# Patient Record
Sex: Female | Born: 1975 | Race: Black or African American | Hispanic: No | Marital: Married | Smoking: Current every day smoker
Health system: Southern US, Community
[De-identification: ages and names within clinical notes are randomized; demographics above are authoritative.]

## PROBLEM LIST (undated history)

## (undated) DIAGNOSIS — N12 Tubulo-interstitial nephritis, not specified as acute or chronic: Secondary | ICD-10-CM

## (undated) DIAGNOSIS — F329 Major depressive disorder, single episode, unspecified: Secondary | ICD-10-CM

## (undated) DIAGNOSIS — R112 Nausea with vomiting, unspecified: Secondary | ICD-10-CM

## (undated) DIAGNOSIS — F32A Depression, unspecified: Secondary | ICD-10-CM

## (undated) DIAGNOSIS — R7303 Prediabetes: Secondary | ICD-10-CM

## (undated) DIAGNOSIS — Z9889 Other specified postprocedural states: Secondary | ICD-10-CM

## (undated) DIAGNOSIS — R011 Cardiac murmur, unspecified: Secondary | ICD-10-CM

## (undated) DIAGNOSIS — G8929 Other chronic pain: Secondary | ICD-10-CM

## (undated) DIAGNOSIS — D649 Anemia, unspecified: Secondary | ICD-10-CM

## (undated) DIAGNOSIS — G43909 Migraine, unspecified, not intractable, without status migrainosus: Secondary | ICD-10-CM

## (undated) DIAGNOSIS — M62838 Other muscle spasm: Secondary | ICD-10-CM

## (undated) DIAGNOSIS — Z9289 Personal history of other medical treatment: Secondary | ICD-10-CM

## (undated) DIAGNOSIS — F419 Anxiety disorder, unspecified: Secondary | ICD-10-CM

## (undated) DIAGNOSIS — M5030 Other cervical disc degeneration, unspecified cervical region: Secondary | ICD-10-CM

## (undated) DIAGNOSIS — D561 Beta thalassemia: Secondary | ICD-10-CM

## (undated) HISTORY — PX: ABLATION: SHX5711

## (undated) HISTORY — PX: PARTIAL HYSTERECTOMY: SHX80

## (undated) HISTORY — DX: Beta thalassemia: D56.1

## (undated) HISTORY — PX: TUMOR EXCISION: SHX421

---
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
## 1997-09-26 ENCOUNTER — Other Ambulatory Visit: Admission: RE | Admit: 1997-09-26 | Discharge: 1997-09-26 | Payer: Self-pay | Admitting: Family Medicine

## 1997-09-27 ENCOUNTER — Emergency Department (HOSPITAL_COMMUNITY): Admission: EM | Admit: 1997-09-27 | Discharge: 1997-09-27 | Payer: Self-pay | Admitting: Emergency Medicine

## 1998-01-10 ENCOUNTER — Emergency Department (HOSPITAL_COMMUNITY): Admission: EM | Admit: 1998-01-10 | Discharge: 1998-01-10 | Payer: Self-pay | Admitting: Emergency Medicine

## 1998-02-15 ENCOUNTER — Emergency Department (HOSPITAL_COMMUNITY): Admission: EM | Admit: 1998-02-15 | Discharge: 1998-02-15 | Payer: Self-pay | Admitting: Emergency Medicine

## 1999-06-07 ENCOUNTER — Ambulatory Visit (HOSPITAL_COMMUNITY): Admission: RE | Admit: 1999-06-07 | Discharge: 1999-06-07 | Payer: Self-pay | Admitting: *Deleted

## 1999-07-08 ENCOUNTER — Inpatient Hospital Stay (HOSPITAL_COMMUNITY): Admission: AD | Admit: 1999-07-08 | Discharge: 1999-07-08 | Payer: Self-pay | Admitting: *Deleted

## 1999-07-15 ENCOUNTER — Inpatient Hospital Stay (HOSPITAL_COMMUNITY): Admission: AD | Admit: 1999-07-15 | Discharge: 1999-07-15 | Payer: Self-pay | Admitting: Obstetrics & Gynecology

## 1999-09-09 ENCOUNTER — Observation Stay (HOSPITAL_COMMUNITY): Admission: AD | Admit: 1999-09-09 | Discharge: 1999-09-10 | Payer: Self-pay | Admitting: Obstetrics & Gynecology

## 1999-09-16 ENCOUNTER — Inpatient Hospital Stay (HOSPITAL_COMMUNITY): Admission: AD | Admit: 1999-09-16 | Discharge: 1999-09-16 | Payer: Self-pay | Admitting: Obstetrics & Gynecology

## 1999-09-23 ENCOUNTER — Inpatient Hospital Stay (HOSPITAL_COMMUNITY): Admission: AD | Admit: 1999-09-23 | Discharge: 1999-09-23 | Payer: Self-pay | Admitting: Obstetrics & Gynecology

## 1999-09-27 ENCOUNTER — Inpatient Hospital Stay (HOSPITAL_COMMUNITY): Admission: AD | Admit: 1999-09-27 | Discharge: 1999-09-27 | Payer: Self-pay | Admitting: Obstetrics

## 1999-09-29 ENCOUNTER — Inpatient Hospital Stay (HOSPITAL_COMMUNITY): Admission: AD | Admit: 1999-09-29 | Discharge: 1999-09-29 | Payer: Self-pay | Admitting: Obstetrics

## 1999-10-02 ENCOUNTER — Inpatient Hospital Stay (HOSPITAL_COMMUNITY): Admission: AD | Admit: 1999-10-02 | Discharge: 1999-10-02 | Payer: Self-pay | Admitting: *Deleted

## 1999-10-03 ENCOUNTER — Inpatient Hospital Stay (HOSPITAL_COMMUNITY): Admission: AD | Admit: 1999-10-03 | Discharge: 1999-10-05 | Payer: Self-pay | Admitting: Obstetrics

## 1999-12-12 ENCOUNTER — Emergency Department (HOSPITAL_COMMUNITY): Admission: EM | Admit: 1999-12-12 | Discharge: 1999-12-12 | Payer: Self-pay | Admitting: Emergency Medicine

## 2001-05-09 HISTORY — PX: DILATION AND CURETTAGE OF UTERUS: SHX78

## 2001-06-20 ENCOUNTER — Emergency Department (HOSPITAL_COMMUNITY): Admission: EM | Admit: 2001-06-20 | Discharge: 2001-06-21 | Payer: Self-pay

## 2001-08-21 ENCOUNTER — Emergency Department (HOSPITAL_COMMUNITY): Admission: EM | Admit: 2001-08-21 | Discharge: 2001-08-21 | Payer: Self-pay | Admitting: Emergency Medicine

## 2001-10-04 ENCOUNTER — Emergency Department (HOSPITAL_COMMUNITY): Admission: EM | Admit: 2001-10-04 | Discharge: 2001-10-04 | Payer: Self-pay | Admitting: Emergency Medicine

## 2001-10-15 ENCOUNTER — Encounter: Payer: Self-pay | Admitting: Emergency Medicine

## 2001-10-15 ENCOUNTER — Emergency Department (HOSPITAL_COMMUNITY): Admission: EM | Admit: 2001-10-15 | Discharge: 2001-10-15 | Payer: Self-pay | Admitting: Emergency Medicine

## 2001-10-17 ENCOUNTER — Inpatient Hospital Stay (HOSPITAL_COMMUNITY): Admission: AD | Admit: 2001-10-17 | Discharge: 2001-10-17 | Payer: Self-pay | Admitting: *Deleted

## 2001-10-21 ENCOUNTER — Inpatient Hospital Stay (HOSPITAL_COMMUNITY): Admission: AD | Admit: 2001-10-21 | Discharge: 2001-10-21 | Payer: Self-pay | Admitting: *Deleted

## 2001-10-24 ENCOUNTER — Inpatient Hospital Stay (HOSPITAL_COMMUNITY): Admission: RE | Admit: 2001-10-24 | Discharge: 2001-10-24 | Payer: Self-pay | Admitting: *Deleted

## 2001-10-24 ENCOUNTER — Encounter: Payer: Self-pay | Admitting: *Deleted

## 2001-11-08 ENCOUNTER — Encounter (INDEPENDENT_AMBULATORY_CARE_PROVIDER_SITE_OTHER): Payer: Self-pay

## 2001-11-08 ENCOUNTER — Ambulatory Visit (HOSPITAL_COMMUNITY): Admission: RE | Admit: 2001-11-08 | Discharge: 2001-11-08 | Payer: Self-pay | Admitting: Obstetrics and Gynecology

## 2001-11-22 ENCOUNTER — Other Ambulatory Visit: Admission: RE | Admit: 2001-11-22 | Discharge: 2001-11-22 | Payer: Self-pay | Admitting: Obstetrics and Gynecology

## 2002-12-06 ENCOUNTER — Emergency Department (HOSPITAL_COMMUNITY): Admission: EM | Admit: 2002-12-06 | Discharge: 2002-12-07 | Payer: Self-pay

## 2003-02-04 ENCOUNTER — Emergency Department (HOSPITAL_COMMUNITY): Admission: EM | Admit: 2003-02-04 | Discharge: 2003-02-05 | Payer: Self-pay | Admitting: Urology

## 2003-05-07 ENCOUNTER — Emergency Department (HOSPITAL_COMMUNITY): Admission: AD | Admit: 2003-05-07 | Discharge: 2003-05-07 | Payer: Self-pay | Admitting: Family Medicine

## 2003-07-09 ENCOUNTER — Ambulatory Visit (HOSPITAL_BASED_OUTPATIENT_CLINIC_OR_DEPARTMENT_OTHER): Admission: RE | Admit: 2003-07-09 | Discharge: 2003-07-09 | Payer: Self-pay | Admitting: General Surgery

## 2003-07-09 ENCOUNTER — Encounter (INDEPENDENT_AMBULATORY_CARE_PROVIDER_SITE_OTHER): Payer: Self-pay | Admitting: Specialist

## 2003-07-09 ENCOUNTER — Ambulatory Visit (HOSPITAL_COMMUNITY): Admission: RE | Admit: 2003-07-09 | Discharge: 2003-07-09 | Payer: Self-pay | Admitting: General Surgery

## 2003-08-24 ENCOUNTER — Emergency Department (HOSPITAL_COMMUNITY): Admission: EM | Admit: 2003-08-24 | Discharge: 2003-08-24 | Payer: Self-pay | Admitting: Emergency Medicine

## 2003-12-19 ENCOUNTER — Inpatient Hospital Stay (HOSPITAL_COMMUNITY): Admission: AD | Admit: 2003-12-19 | Discharge: 2003-12-19 | Payer: Self-pay | Admitting: Obstetrics and Gynecology

## 2003-12-19 ENCOUNTER — Inpatient Hospital Stay (HOSPITAL_COMMUNITY): Admission: AD | Admit: 2003-12-19 | Discharge: 2003-12-19 | Payer: Self-pay | Admitting: Family Medicine

## 2004-01-08 ENCOUNTER — Ambulatory Visit: Payer: Self-pay | Admitting: Obstetrics and Gynecology

## 2004-02-08 ENCOUNTER — Emergency Department (HOSPITAL_COMMUNITY): Admission: EM | Admit: 2004-02-08 | Discharge: 2004-02-09 | Payer: Self-pay | Admitting: Emergency Medicine

## 2004-07-11 ENCOUNTER — Emergency Department (HOSPITAL_COMMUNITY): Admission: EM | Admit: 2004-07-11 | Discharge: 2004-07-11 | Payer: Self-pay | Admitting: Emergency Medicine

## 2004-11-05 ENCOUNTER — Emergency Department (HOSPITAL_COMMUNITY): Admission: EM | Admit: 2004-11-05 | Discharge: 2004-11-05 | Payer: Self-pay | Admitting: Emergency Medicine

## 2004-11-16 ENCOUNTER — Emergency Department (HOSPITAL_COMMUNITY): Admission: EM | Admit: 2004-11-16 | Discharge: 2004-11-16 | Payer: Self-pay | Admitting: Emergency Medicine

## 2004-11-25 ENCOUNTER — Emergency Department (HOSPITAL_COMMUNITY): Admission: EM | Admit: 2004-11-25 | Discharge: 2004-11-25 | Payer: Self-pay | Admitting: Emergency Medicine

## 2005-02-14 ENCOUNTER — Emergency Department (HOSPITAL_COMMUNITY): Admission: EM | Admit: 2005-02-14 | Discharge: 2005-02-14 | Payer: Self-pay | Admitting: Emergency Medicine

## 2005-03-10 ENCOUNTER — Emergency Department (HOSPITAL_COMMUNITY): Admission: EM | Admit: 2005-03-10 | Discharge: 2005-03-11 | Payer: Self-pay | Admitting: *Deleted

## 2005-06-20 ENCOUNTER — Emergency Department (HOSPITAL_COMMUNITY): Admission: EM | Admit: 2005-06-20 | Discharge: 2005-06-20 | Payer: Self-pay | Admitting: Emergency Medicine

## 2006-09-08 ENCOUNTER — Inpatient Hospital Stay (HOSPITAL_COMMUNITY): Admission: AD | Admit: 2006-09-08 | Discharge: 2006-09-08 | Payer: Self-pay | Admitting: Obstetrics & Gynecology

## 2006-09-10 ENCOUNTER — Inpatient Hospital Stay (HOSPITAL_COMMUNITY): Admission: AD | Admit: 2006-09-10 | Discharge: 2006-09-10 | Payer: Self-pay | Admitting: Obstetrics & Gynecology

## 2006-09-14 ENCOUNTER — Inpatient Hospital Stay (HOSPITAL_COMMUNITY): Admission: AD | Admit: 2006-09-14 | Discharge: 2006-09-15 | Payer: Self-pay | Admitting: Obstetrics and Gynecology

## 2006-09-29 ENCOUNTER — Inpatient Hospital Stay (HOSPITAL_COMMUNITY): Admission: AD | Admit: 2006-09-29 | Discharge: 2006-09-29 | Payer: Self-pay | Admitting: Obstetrics and Gynecology

## 2007-03-07 ENCOUNTER — Inpatient Hospital Stay (HOSPITAL_COMMUNITY): Admission: AD | Admit: 2007-03-07 | Discharge: 2007-03-07 | Payer: Self-pay | Admitting: Obstetrics and Gynecology

## 2007-04-04 ENCOUNTER — Inpatient Hospital Stay (HOSPITAL_COMMUNITY): Admission: AD | Admit: 2007-04-04 | Discharge: 2007-04-05 | Payer: Self-pay | Admitting: Obstetrics and Gynecology

## 2007-04-17 ENCOUNTER — Inpatient Hospital Stay (HOSPITAL_COMMUNITY): Admission: AD | Admit: 2007-04-17 | Discharge: 2007-04-18 | Payer: Self-pay | Admitting: Obstetrics and Gynecology

## 2007-04-23 ENCOUNTER — Inpatient Hospital Stay (HOSPITAL_COMMUNITY): Admission: AD | Admit: 2007-04-23 | Discharge: 2007-04-23 | Payer: Self-pay | Admitting: Obstetrics and Gynecology

## 2007-04-30 ENCOUNTER — Inpatient Hospital Stay (HOSPITAL_COMMUNITY): Admission: AD | Admit: 2007-04-30 | Discharge: 2007-05-03 | Payer: Self-pay | Admitting: Obstetrics and Gynecology

## 2007-11-28 ENCOUNTER — Emergency Department (HOSPITAL_COMMUNITY): Admission: EM | Admit: 2007-11-28 | Discharge: 2007-11-28 | Payer: Self-pay | Admitting: Emergency Medicine

## 2008-01-15 ENCOUNTER — Emergency Department (HOSPITAL_COMMUNITY): Admission: EM | Admit: 2008-01-15 | Discharge: 2008-01-15 | Payer: Self-pay | Admitting: Emergency Medicine

## 2008-01-17 ENCOUNTER — Emergency Department (HOSPITAL_COMMUNITY): Admission: EM | Admit: 2008-01-17 | Discharge: 2008-01-18 | Payer: Self-pay | Admitting: Emergency Medicine

## 2008-02-27 ENCOUNTER — Emergency Department (HOSPITAL_COMMUNITY): Admission: EM | Admit: 2008-02-27 | Discharge: 2008-02-27 | Payer: Self-pay | Admitting: Emergency Medicine

## 2008-08-30 ENCOUNTER — Emergency Department (HOSPITAL_COMMUNITY): Admission: EM | Admit: 2008-08-30 | Discharge: 2008-08-30 | Payer: Self-pay | Admitting: Family Medicine

## 2009-05-09 HISTORY — PX: TUBAL LIGATION: SHX77

## 2009-06-05 ENCOUNTER — Inpatient Hospital Stay (HOSPITAL_COMMUNITY): Admission: AD | Admit: 2009-06-05 | Discharge: 2009-06-05 | Payer: Self-pay | Admitting: Obstetrics & Gynecology

## 2009-06-05 ENCOUNTER — Ambulatory Visit: Payer: Self-pay | Admitting: Advanced Practice Midwife

## 2009-09-18 ENCOUNTER — Inpatient Hospital Stay (HOSPITAL_COMMUNITY): Admission: AD | Admit: 2009-09-18 | Discharge: 2009-09-19 | Payer: Self-pay | Admitting: Obstetrics and Gynecology

## 2009-10-03 ENCOUNTER — Inpatient Hospital Stay (HOSPITAL_COMMUNITY): Admission: AD | Admit: 2009-10-03 | Discharge: 2009-10-06 | Payer: Self-pay | Admitting: Obstetrics and Gynecology

## 2009-10-04 ENCOUNTER — Encounter (INDEPENDENT_AMBULATORY_CARE_PROVIDER_SITE_OTHER): Payer: Self-pay | Admitting: Obstetrics and Gynecology

## 2009-10-09 ENCOUNTER — Inpatient Hospital Stay (HOSPITAL_COMMUNITY): Admission: AD | Admit: 2009-10-09 | Discharge: 2009-10-09 | Payer: Self-pay | Admitting: Obstetrics and Gynecology

## 2010-04-07 ENCOUNTER — Emergency Department (HOSPITAL_COMMUNITY)
Admission: EM | Admit: 2010-04-07 | Discharge: 2010-04-07 | Payer: Self-pay | Source: Home / Self Care | Admitting: Emergency Medicine

## 2010-04-09 ENCOUNTER — Emergency Department (HOSPITAL_COMMUNITY)
Admission: EM | Admit: 2010-04-09 | Discharge: 2010-04-09 | Payer: Self-pay | Source: Home / Self Care | Admitting: Emergency Medicine

## 2010-04-11 ENCOUNTER — Inpatient Hospital Stay (HOSPITAL_COMMUNITY)
Admission: EM | Admit: 2010-04-11 | Discharge: 2010-04-12 | Payer: Self-pay | Source: Home / Self Care | Admitting: Emergency Medicine

## 2010-07-20 LAB — CBC
Hemoglobin: 9.7 g/dL — ABNORMAL LOW (ref 12.0–15.0)
MCH: 21.9 pg — ABNORMAL LOW (ref 26.0–34.0)
MCHC: 31.2 g/dL (ref 30.0–36.0)
Platelets: 205 10*3/uL (ref 150–400)
RBC: 4.43 MIL/uL (ref 3.87–5.11)

## 2010-07-20 LAB — DIFFERENTIAL
Basophils Relative: 0 % (ref 0–1)
Eosinophils Absolute: 0.1 10*3/uL (ref 0.0–0.7)
Lymphocytes Relative: 17 % (ref 12–46)
Neutro Abs: 9.7 10*3/uL — ABNORMAL HIGH (ref 1.7–7.7)
Neutrophils Relative %: 74 % (ref 43–77)

## 2010-07-20 LAB — POCT I-STAT, CHEM 8
Chloride: 105 mEq/L (ref 96–112)
Creatinine, Ser: 1.1 mg/dL (ref 0.4–1.2)
HCT: 34 % — ABNORMAL LOW (ref 36.0–46.0)
Hemoglobin: 11.6 g/dL — ABNORMAL LOW (ref 12.0–15.0)
TCO2: 25 mmol/L (ref 0–100)

## 2010-07-26 LAB — CBC
HCT: 28.4 % — ABNORMAL LOW (ref 36.0–46.0)
HCT: 25.8 % — ABNORMAL LOW (ref 36.0–46.0)
Hemoglobin: 8.3 g/dL — ABNORMAL LOW (ref 12.0–15.0)
MCHC: 31.5 g/dL (ref 30.0–36.0)
MCHC: 32 g/dL (ref 30.0–36.0)
MCHC: 32.2 g/dL (ref 30.0–36.0)
MCV: 73.8 fL — ABNORMAL LOW (ref 78.0–100.0)
MCV: 73.1 fL — ABNORMAL LOW (ref 78.0–100.0)
MCV: 73.8 fL — ABNORMAL LOW (ref 78.0–100.0)
Platelets: 221 10*3/uL (ref 150–400)
Platelets: 236 10*3/uL (ref 150–400)
RBC: 3.5 MIL/uL — ABNORMAL LOW (ref 3.87–5.11)
RDW: 22.6 % — ABNORMAL HIGH (ref 11.5–15.5)
RDW: 23.1 % — ABNORMAL HIGH (ref 11.5–15.5)
WBC: 9.6 10*3/uL (ref 4.0–10.5)
WBC: 11 10*3/uL — ABNORMAL HIGH (ref 4.0–10.5)
WBC: 12.1 10*3/uL — ABNORMAL HIGH (ref 4.0–10.5)

## 2010-07-26 LAB — URINALYSIS, ROUTINE W REFLEX MICROSCOPIC
Bilirubin Urine: NEGATIVE
Glucose, UA: NEGATIVE mg/dL
Glucose, UA: NEGATIVE mg/dL
Hgb urine dipstick: NEGATIVE
Ketones, ur: NEGATIVE mg/dL
Protein, ur: NEGATIVE mg/dL
Specific Gravity, Urine: 1.015 (ref 1.005–1.030)
Urobilinogen, UA: 0.2 mg/dL (ref 0.0–1.0)
Urobilinogen, UA: 2 mg/dL — ABNORMAL HIGH (ref 0.0–1.0)
pH: 6 (ref 5.0–8.0)
pH: 6.5 (ref 5.0–8.0)

## 2010-07-26 LAB — COMPREHENSIVE METABOLIC PANEL
Albumin: 2.8 g/dL — ABNORMAL LOW (ref 3.5–5.2)
BUN: 12 mg/dL (ref 6–23)
Chloride: 107 mEq/L (ref 96–112)
Creatinine, Ser: 0.83 mg/dL (ref 0.4–1.2)
GFR calc Af Amer: >60 mL/min (ref >60)
GFR calc non Af Amer: >60 mL/min (ref >60)
Sodium: 137 mEq/L (ref 135–145)
Total Bilirubin: 0.2 mg/dL — ABNORMAL LOW (ref 0.3–1.2)

## 2010-07-26 LAB — RPR: RPR Ser Ql: NONREACTIVE

## 2010-07-26 LAB — URINE MICROSCOPIC-ADD ON

## 2010-07-26 LAB — RAPID HIV SCREEN (WH-MAU): Rapid HIV Screen: NONREACTIVE

## 2010-07-26 LAB — URIC ACID: Uric Acid, Serum: 5.9 mg/dL (ref 2.4–7.0)

## 2010-07-27 ENCOUNTER — Emergency Department (HOSPITAL_COMMUNITY)
Admission: EM | Admit: 2010-07-27 | Discharge: 2010-07-27 | Disposition: A | Discharge status: Home / Self Care | Payer: Medicaid Other | Attending: Emergency Medicine | Admitting: Emergency Medicine

## 2010-07-27 ENCOUNTER — Emergency Department (HOSPITAL_COMMUNITY): Payer: Medicaid Other

## 2010-07-27 DIAGNOSIS — F329 Major depressive disorder, single episode, unspecified: Secondary | ICD-10-CM | POA: Insufficient documentation

## 2010-07-27 DIAGNOSIS — F3289 Other specified depressive episodes: Secondary | ICD-10-CM | POA: Insufficient documentation

## 2010-07-27 DIAGNOSIS — R05 Cough: Secondary | ICD-10-CM | POA: Insufficient documentation

## 2010-07-27 DIAGNOSIS — J40 Bronchitis, not specified as acute or chronic: Secondary | ICD-10-CM | POA: Insufficient documentation

## 2010-07-27 DIAGNOSIS — R059 Cough, unspecified: Secondary | ICD-10-CM | POA: Insufficient documentation

## 2010-07-27 LAB — URINALYSIS, ROUTINE W REFLEX MICROSCOPIC
Bilirubin Urine: NEGATIVE
Ketones, ur: NEGATIVE mg/dL
Ketones, ur: NEGATIVE mg/dL
Nitrite: NEGATIVE
Nitrite: NEGATIVE
Protein, ur: NEGATIVE mg/dL
Specific Gravity, Urine: 1.02 (ref 1.005–1.030)
Urobilinogen, UA: 0.2 mg/dL (ref 0.0–1.0)
Urobilinogen, UA: 1 mg/dL (ref 0.0–1.0)
pH: 6.5 (ref 5.0–8.0)

## 2010-07-27 LAB — URINE MICROSCOPIC-ADD ON

## 2010-07-27 LAB — CBC
Hemoglobin: 9.2 g/dL — ABNORMAL LOW (ref 12.0–15.0)
MCHC: 31.6 g/dL (ref 30.0–36.0)
RBC: 4.03 MIL/uL (ref 3.87–5.11)

## 2010-07-27 LAB — URINE CULTURE: Colony Count: 2000

## 2010-07-27 LAB — HIV ANTIBODY (ROUTINE TESTING W REFLEX): HIV: NONREACTIVE

## 2010-07-27 LAB — POCT PREGNANCY, URINE: Preg Test, Ur: NEGATIVE

## 2010-09-21 NOTE — H&P (Signed)
Gabriella Snyder, SCHLEMMER          ACCOUNT NO.:  [id number]   MEDICAL RECORD NO.:  [id number]          PATIENT TYPE:  INP   LOCATION:  9160                          FACILITY:  [facility]   PHYSICIAN:  Crist Fat. Rivard, M.D. DATE OF BIRTH:  [birth year]   DATE OF ADMISSION:  04/30/2007  DATE OF DISCHARGE:                              HISTORY & PHYSICAL   The patient is a 35 year old gravida 6, para 3-0-2-3, at 40 weeks who  presented complaining of increased uterine contractions today.  She  denies leaking or bleeding and reports positive fetal movement.  Cervix  was 2 cm in the office today.  Pregnancy has been remarkable for:  1. Previous cesarean section with two subsequent VBACs with a plan for      VBAC this pregnancy.  2. History of STD's in early and late pregnancy with a negative test      of cure in November of 2008.  3. Obesity.  4. One SAB and one TAB.  5. History of transfusion after first delivery.  6. History of rapid labor.  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  8. Planned tubal ligation with consent signed in August, but patient      now undecided.   PRENATAL LABORATORY DATA:  Blood type is A positive, Rh antibody  negative, VDRL nonreactive, rubella titer positive, hepatitis B surface  antigen negative, HIV was nonreactive, rubella titer was immune, sickle  cell test was negative.  Pap showed atypical cells in June.  GC and  Chlamydia cultures were negative in June.  HSV II titers were positive.  First trimester screen was not done secondary to slightly late to care.  Quadruple screen was done and was normal.  Glucola was normal.  Hemoglobin upon entry into practice was 10.5.  It was 10 at 28 weeks.  She was diagnosed with GC and Chlamydia at 28 weeks and was treated.  She still had positive Chlamydia on a test of cure at 32 weeks.  GC was  negative.  The rest of her pregnancy was essentially uncomplicated.   HISTORY OF PRESENT PREGNANCY:  The patient  entered care at approximately  14 weeks.  EDC was December 22 established by a first trimester  ultrasound.  She had a quadruple screen that was normal.  She had  another ultrasound at early pregnancy that showed a previa in June, and  this resolved by her ultrasound at 20 weeks.  There was also normal  anatomy noted.  Tubal papers were signed on August 5.  She had  contractions at 28 weeks.  GC, Chlamydia, and urine culture were done.  GC and Chlamydia cultures were noted to be positive and these were  treated.  She was also treated with Macrobid.  Fetal fibronectin was  negative at 28 weeks.  VBAC consent was signed on March 20, 2007.  With positive GC and Chlamydia in October, she was treated.  A negative  test of cure was done at 31 weeks and was still noted to have positive  Chlamydia.  This was retreated.  The rest of her pregnancy was  uncomplicated except for contraction activity over the last week.   PAST OBSTETRICAL HISTORY:  In 1993 she had a primary low transverse  cesarean section for a female infant that weighed 8 pounds 11 ounces at 44  weeks.  She was in labor for 42 hours.  She had epidural anesthesia.  She required a blood transfusion after that delivery.  The baby was in  NICU.  In 1998 she had a vaginal birth after cesarean section of a female  infant weighing 7 pounds 8 ounces at 38 weeks.  She was in labor 8 hours  and had epidural anesthesia with no complications.  In 1999 she had a 6-  week TAB at [facility] in [place].  In 2001 she had a  vaginal birth after cesarean section of a female infant weighing 8  pounds 15 ounces at 40-3/7 weeks.  She was in labor 1 hour and she had  no complications.  In 2003 she had a miscarriage and had a D&C done by  Dr. Estanislado Pandy.  She has been on iron with her previous pregnancies.  She  had hyperemesis with her fourth pregnancy.  She had a blood transfusion  after her first pregnancy.  On her second pregnancy, she was  placed on  bed rest for six months but did not require any medications for preterm  labor.  She got pregnant with her third pregnancy on Depo-Provera.  She  had positive beta Strep in her fourth pregnancy.   PAST MEDICAL HISTORY:  She reports usual childhood diseases.  She is a  previous condom, Depo-Provera patch, and Seasonale user.  She has one  abnormal Pap in the past.  Last Pap was in May of 2008 and it was normal  until she had her atypical cells in June of 2008.  She has a history of  anemia.  She had a blood transfusion with her first pregnancy.  She had  a UTI with her current pregnancy early in pregnancy.  Accidents; the  patient had a broken ankle in 2004.   PAST SURGICAL HISTORY:  She had cesarean section in the past.  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   FAMILY HISTORY:  Her father has hypertension.  Her son has sickle cell  trait.  Her maternal grandmother has anemia.  Her second and third  children have asthma.  Her mother, maternal aunt x2, maternal  grandmother, and father are all diet-controlled diabetics.  Maternal  aunt is on dialysis.  Paternal aunt and paternal grandfather have  epilepsy.  The patient's second child has Absent seizures currently  taking medication.  Her maternal first cousin is bipolar and maternal  uncle is schizophrenic.  The patient has another relative who is  autistic, another who is bipolar.   GENETIC HISTORY:  Remarkable for her first son being autistic, her  second son being bipolar.  The patient was born with a hole in her heart  but healed itself.  Her first son was also born with a hole in his heart  that healed itself.   SOCIAL HISTORY:  The patient denies any alcohol, drug, or tobacco use  during this pregnancy.   PHYSICAL EXAMINATION:  VITAL SIGNS:  Stable, the patient is afebrile.  HEENT:  Within normal limits.  LUNGS:   Bilateral breath sounds are clear.  HEART:  Regular  rate and rhythm without murmur.  BREASTS:  Soft and nontender.  ABDOMEN:  Fundal height is approximately 38 cm.  Estimated fetal weight  is 8 to 8-1/2 pounds.  Uterine contractions are every 3-4 minutes of  moderate quality.  PELVIC:  Cervical examination is 3+, 90%, and vertex at -2 station, with  slight bulging bag of water.  Fetal heart rate is reactive and  reassuring with no decelerations.  EXTREMITIES:  Deep tendon reflexes are 2+ without clonus.  There is  trace edema noted.   IMPRESSION:  1. Intrauterine pregnancy at 40 weeks.  2. Early labor.  3. Negative Group B Strep.  4. History of herpes simplex virus, but no recent or current lesions.   PLAN:  1. Admit to birthing suite per consult with Dr. Estanislado Pandy as attending      physician.  2. Routine physician orders.  3. The patient currently declines pain medication at present.  4. We will hold consenting for tubal until after delivery when the      patient may be more sure of her decision.  5. Risks and benefits of VBAC were reviewed with the patient and her      husband with questions reviewed.      Renaldo Reel Emilee Hero, C.N.M.      Crist Fat Rivard, M.D.  Electronically Signed    VLL/MEDQ  D:  04/30/2007  T:  05/01/2007  Job:  [id number]

## 2010-09-24 NOTE — Op Note (Signed)
Gabriella Snyder, Gabriella Snyder                       ACCOUNT NO.:  [id number]   MEDICAL RECORD NO.:  [id number]                   PATIENT TYPE:  AMB   LOCATION:  DSC                                  FACILITY:  [facility]   PHYSICIAN:  Ollen Gross. Vernell Morgans, M.D.              DATE OF BIRTH:  11/08/1975   DATE OF PROCEDURE:  07/09/2003  DATE OF DISCHARGE:                                 OPERATIVE REPORT   PREOPERATIVE DIAGNOSIS:  Lipoma of the right axilla.   POSTOPERATIVE DIAGNOSIS:  Lipoma of the right axilla.   PROCEDURE:  Excision of lipoma approximately 5 cm from right axilla.   SURGEON:  Ollen Gross. Carolynne Edouard, M.D.   ANESTHESIA:  General endotracheal.   PROCEDURE:  After informed consent was obtained, the patient was brought to  the operating room and placed in the supine position on the operating room  table.  After adequate ingestion of general anesthesia, the patient was  placed in the lateral position with the right side up.  Her right axilla and  posterior back was prepped with Betadine, draped in the usual sterile  manner.  A transverse incision was made overlying the palpable mass.  This  incision was carried down through the skin and subcutaneous tissue sharply  with the electrocautery.  A combination of blunt finger dissection and sharp  dissection with the electrocautery was used to separate the lipoma from the  rest of the subcutaneous tissue.  Once this was accomplished, the lipoma was  removed and the patient sent to pathology for further evaluation.  The wound  was then evaluated and found to be hemostatic.  The deep layer of the wound  was then closed with interrupted 3-0 Vicryl stitches and the skin was closed  with a running 4-0 Monocryl subcuticular stitch.  Benzoin, Steri-Strips and  sterile dressings were applied.  The patient tolerated the procedure well.  At the end of the case, only the sponge and instrument counts were correct.  The patient was then awakened, taken to the  recovery room in stable  condition.                                               Ollen Gross. Vernell Morgans, M.D.    PST/MEDQ  D:  07/09/2003  T:  07/10/2003  Job:  [id number]

## 2010-09-24 NOTE — Op Note (Signed)
[facility]  Patient:    Gabriella Snyder, FUHR Visit Number: [id number] MRN: [id number]          Service Type: DSU Location: [facility] Attending Physician:  Esmeralda Arthur Dictated by:   Silverio Lay, M.D. Proc. Date: 11/08/01 Admit Date:  11/08/2001 Discharge Date: 11/08/2001                             Operative Report  PREOPERATIVE DIAGNOSIS:       Missed abortion.  POSTOPERATIVE DIAGNOSIS:      Missed abortion.  OPERATION:                    D&E.  SURGEON:                      Silverio Lay, M.D.  ASSISTANT:  ANESTHESIA:                   MAC plus paracervical block.  ESTIMATED BLOOD LOSS:         Minimal.  DESCRIPTION OF PROCEDURE:     After being informed of the planned procedure with possible complications including bleeding, infection, uterine perforation with subsequent intra-abdominal organ injury with need for laparoscopy and laparotomy, as well as retained products of conception, informed consent is obtained. The patient is taken to OR#2 and given IV sedation.  She is placed in the lithotomy position, prepped and draped in the usual sterile fashion, and her bladder is emptied with an in-and-out catheter.  GYN examination reveals an anteverted uterus increased in size and irregular possibly compatible with uterine fibroids about 14 to 15 weeks size.  Both adnexa are normal.  We insert the speculum, infiltrate the anterior lip of the cervix with Nesocaine 2 cc 1%, grasped the anterior lip of the cervix with the tenaculum forcep and proceed with the paracervical block in the usual fashion with 20 cc of Nesocaine 1%.  The uterus is then sounded at 11.5 cm and the cervix is dilated using Hegar dilator at #41.  We can now use a #10 curved cannula to proceed with evacuation of uterine contents which is performed easily and removes large amount of products of conception.  After the evacuation is completed, a sharp curet is used to assess  the uterine walls including both cornua which are felt to be free of any remaining tissue.  To be noted presence of a submucosal fibroid on the anterior and left uterine wall.  Instruments are then removed.  Hemostasis is adequate.  Instruments and sponge count is complete x2.  Estimated blood loss is minimal.  The procedure is well tolerated by the patient who is taken to the recovery room in well and stable condition. Dictated by:   Silverio Lay, M.D. Attending Physician:  Esmeralda Arthur DD:  11/08/01 TD:  11/12/01 Job: [id number]

## 2010-09-24 NOTE — Group Therapy Note (Signed)
Gabriella Snyder, Gabriella Snyder                       ACCOUNT NO.:  [id number]   MEDICAL RECORD NO.:  [id number]                   PATIENT TYPE:  WOC   LOCATION:  [facility]                           FACILITY:  [facility]   PHYSICIAN:  Argentina Donovan, MD                     DATE OF BIRTH:  [birth year]   DATE OF SERVICE:  01/08/2004                                    CLINIC NOTE   REASON FOR VISIT:  Menstrual abnormality.   SUBJECTIVE:  Gabriella Snyder is a 35 year old gravida 5 para 3-0-2-3 who  presents with 77-month history of decreased duration of menses.  Previous  menstrual periods occurred at regular monthly interval, lasting 5 days in  duration, consuming seven to eight pads per day.  However, for the past 7  months the patient noted menstrual periods that last only 2 days with the  same menstrual flow.  She also noted some mild dysmenorrhea.  Last month she  also reported having two menstrual periods in a month lasting 2 days per  episode.  She sought consult at the MAU where an ultrasound was done which  showed normal findings.  The patient was then advised to follow up at the  GYN clinic.   PAST MEDICAL HISTORY:  Unremarkable.   MENSTRUAL HISTORY:  Menses occurring at regular monthly intervals, 5 days in  duration, consuming seven to eight pads per day.  Last normal menstrual  period was December 2004.   OBSTETRICAL HISTORY:  She is a gravida 5 para 3-0-2-3 with two spontaneous  vaginal deliveries, one cesarean section, one spontaneous abortion, and one  TAB.   SOCIAL HISTORY:  Does not smoke, drink alcohol, or use drugs.  She is  presently trying to conceive and has been trying for the past 6 months.   OBJECTIVE:  VITAL SIGNS:  Noted in the chart.  ABDOMEN:  Soft, normal bowel sounds, no masses, no tenderness.  PELVIC:  Normal external genitalia, parous vagina.  No vaginal bleeding or  discharge.  Cervix smooth, firm, closed, nontender.  Corpus small.  No  adnexal masses or  tenderness.   ASSESSMENT AND PLAN:  Intermenstrual bleeding.  Advised the patient to start  a menstrual diary to document frequency, duration, and amount of menstrual  flow.  Advised yearly Pap smear.  Reassured the patient that at this point  there is no need for further workup.  However, if she continues to become  symptomatic with persistence and recurrence of menstrual abnormalities that  she should return to clinic for reevaluation.                                               Argentina Donovan, MD    PR/MEDQ  D:  01/08/2004  T:  01/10/2004  Job:  [id number]

## 2010-12-31 ENCOUNTER — Inpatient Hospital Stay (HOSPITAL_COMMUNITY)
Admission: AD | Admit: 2010-12-31 | Discharge: 2010-12-31 | Disposition: A | Discharge status: Home / Self Care | Payer: Medicaid Other | Source: Ambulatory Visit | Attending: Obstetrics & Gynecology | Admitting: Obstetrics & Gynecology

## 2010-12-31 ENCOUNTER — Encounter (HOSPITAL_COMMUNITY): Payer: Self-pay | Admitting: *Deleted

## 2010-12-31 DIAGNOSIS — Z202 Contact with and (suspected) exposure to infections with a predominantly sexual mode of transmission: Secondary | ICD-10-CM

## 2010-12-31 DIAGNOSIS — A599 Trichomoniasis, unspecified: Secondary | ICD-10-CM

## 2010-12-31 DIAGNOSIS — A5901 Trichomonal vulvovaginitis: Secondary | ICD-10-CM | POA: Insufficient documentation

## 2010-12-31 HISTORY — DX: Anemia, unspecified: D64.9

## 2010-12-31 LAB — URINALYSIS, ROUTINE W REFLEX MICROSCOPIC
Bilirubin Urine: NEGATIVE
Glucose, UA: NEGATIVE mg/dL
Ketones, ur: 15 mg/dL — AB
Protein, ur: NEGATIVE mg/dL
pH: 6 (ref 5.0–8.0)

## 2010-12-31 LAB — URINE MICROSCOPIC-ADD ON

## 2010-12-31 LAB — POCT PREGNANCY, URINE: Preg Test, Ur: NEGATIVE

## 2010-12-31 MED ORDER — METRONIDAZOLE 500 MG PO TABS: 500.0000 mg | ORAL_TABLET | Freq: Two times a day (BID) | ORAL | Status: AC

## 2010-12-31 MED ORDER — AZITHROMYCIN 1 G PO PACK
1.0000 g | PACK | Freq: Once | ORAL | Status: AC
Start: 2010-12-31 — End: 2010-12-31
  Administered 2010-12-31: 1 g via ORAL
  Filled 2010-12-31: qty 1

## 2010-12-31 NOTE — Progress Notes (Signed)
Pt states, " I've had a white odorous vaginal discharge to three days,. I think I have an STD."

## 2010-12-31 NOTE — Progress Notes (Signed)
Pt in for std check, states she has a white, liquid discharge x3 days.  States she had gc/chlamydia in past and discharge was same.  Reports mild lower abdominal cramping, but states it is time for menstrual cycle.

## 2010-12-31 NOTE — ED Provider Notes (Signed)
History     Chief Complaint  Patient presents with  . Vaginal Discharge   HPI  Patient presents stating her boyfriend of 1 year called her today to inform her that he had tested positive for chlamydia.  She is here for treatment.  LMP 4 weeks ago, menses expected now.   BTL for contraception. Having back pain related to expected menses.  Denies vaginal discharge or odor.     Past Medical History  Diagnosis Date  . Anemia     Past Surgical History  Procedure Date  . Cesarean section   . Dilation and curettage of uterus   . Tubal ligation     No family history on file.  History  Substance Use Topics  . Smoking status: Current Everyday Smoker -- 0.5 packs/day    Types: Cigarettes  . Smokeless tobacco: Not on file  . Alcohol Use: No    Allergies: No Known Allergies  Prescriptions prior to admission  Medication Sig Dispense Refill  . Pseudoephedrine HCl (SUDAFED PO) Take 2 tablets by mouth daily as needed. Patient was taking this medication for symptoms of a cold.         Review of Systems  Gastrointestinal: Negative for abdominal pain.  Genitourinary: Negative.  Flank pain: negative for vaginal discharge or odor.  Musculoskeletal: Positive for back pain.   Physical Exam   Blood pressure 125/74, pulse 73, temperature 98.8 F (37.1 C), temperature source Oral, resp. rate 18, height 5\' 9"  (1.753 m), weight 235 lb 8 oz (106.822 kg), last menstrual period 12/04/2010.  Physical Exam  Constitutional: She is oriented to person, place, and time. She appears well-developed and well-nourished.  Neck: Neck supple.  Respiratory: Breath sounds normal.  GI: Soft. She exhibits no distension and no mass. There is no tenderness. There is no rebound and no guarding.  Genitourinary: Uterus is not tender. Cervix exhibits no motion tenderness and no friability. Right adnexum displays no mass, no tenderness and no fullness. Left adnexum displays no mass, no tenderness and no fullness.  Vaginal discharge (white frothy discharge with odor.) found.  Neurological: She is alert and oriented to person, place, and time.  Skin: Skin is warm and dry.    Results for orders placed during the hospital encounter of 12/31/10 (from the past 24 hour(s))  URINALYSIS, ROUTINE W REFLEX MICROSCOPIC     Status: Abnormal   Collection Time   12/31/10  5:10 PM      Component Value Range   Color, Urine YELLOW  YELLOW    Appearance CLEAR  CLEAR    Specific Gravity, Urine >1.030 (*) 1.005 - 1.030    pH 6.0  5.0 - 8.0    Glucose, UA NEGATIVE  NEGATIVE (mg/dL)   Hgb urine dipstick LARGE (*) NEGATIVE    Bilirubin Urine NEGATIVE  NEGATIVE    Ketones, ur 15 (*) NEGATIVE (mg/dL)   Protein, ur NEGATIVE  NEGATIVE (mg/dL)   Urobilinogen, UA 2.0 (*) 0.0 - 1.0 (mg/dL)   Nitrite NEGATIVE  NEGATIVE    Leukocytes, UA SMALL (*) NEGATIVE   URINE MICROSCOPIC-ADD ON     Status: Abnormal   Collection Time   12/31/10  5:10 PM      Component Value Range   Squamous Epithelial / LPF FEW (*) RARE    WBC, UA 11-20  <3 (WBC/hpf)   RBC / HPF 0-2  <3 (RBC/hpf)   Bacteria, UA FEW (*) RARE    Urine-Other MUCOUS PRESENT    POCT PREGNANCY,  URINE     Status: Normal   Collection Time   12/31/10  5:44 PM      Component Value Range   Preg Test, Ur NEGATIVE    WET PREP, GENITAL     Status: Abnormal   Collection Time   12/31/10  6:55 PM      Component Value Range   Yeast, Wet Prep NONE SEEN  NONE SEEN    Trich, Wet Prep MANY (*) NONE SEEN    Clue Cells, Wet Prep FEW (*) NONE SEEN    WBC, Wet Prep HPF POC FEW (*) NONE SEEN     MAU Course  Procedures    MDM   Zithromax 1gm po given in MAU  Assessment and Plan  Assessment :Exposure to Chlamydia                        Trichomonas  Plan: GC/CHlamydia results back in 2-3 days.  Do not have intercourse for 1 week.   ALWAYS USE CONDOMS Rx sent to pharmacy for Flagyl 500mg  po bid for 1 week.   Jahrell Hamor,EVE M 12/31/2010, 6:52 PM

## 2011-01-01 LAB — GC/CHLAMYDIA PROBE AMP, GENITAL
Chlamydia, DNA Probe: NEGATIVE
GC Probe Amp, Genital: NEGATIVE

## 2011-02-04 LAB — COMPREHENSIVE METABOLIC PANEL
ALT: 8
AST: 19
Alkaline Phosphatase: 54
CO2: 24
GFR calc Af Amer: >60
GFR calc non Af Amer: >60
Glucose, Bld: 95
Potassium: 3.7
Sodium: 140
Total Protein: 7.7

## 2011-02-04 LAB — URINALYSIS, ROUTINE W REFLEX MICROSCOPIC
Bilirubin Urine: NEGATIVE
Glucose, UA: NEGATIVE
Hgb urine dipstick: NEGATIVE
Ketones, ur: NEGATIVE
Protein, ur: NEGATIVE
pH: 6

## 2011-02-04 LAB — CBC
Hemoglobin: 10.6 — ABNORMAL LOW
RBC: 4.59

## 2011-02-04 LAB — URINE MICROSCOPIC-ADD ON

## 2011-02-04 LAB — DIFFERENTIAL
Basophils Relative: 1
Eosinophils Absolute: 0.2
Eosinophils Relative: 3
Monocytes Relative: 5
Neutrophils Relative %: 54

## 2011-02-09 LAB — RAPID STREP SCREEN (MED CTR MEBANE ONLY): Streptococcus, Group A Screen (Direct): POSITIVE — AB

## 2011-02-11 LAB — CBC
HCT: 26.1 — ABNORMAL LOW
Hemoglobin: 8.3 — ABNORMAL LOW
Platelets: 258
RBC: 3.78 — ABNORMAL LOW
RBC: 4.52
RDW: 20 — ABNORMAL HIGH
WBC: 12 — ABNORMAL HIGH
WBC: 13.6 — ABNORMAL HIGH

## 2011-02-11 LAB — RPR: RPR Ser Ql: NONREACTIVE

## 2011-02-16 LAB — WET PREP, GENITAL: Yeast Wet Prep HPF POC: NONE SEEN

## 2011-02-16 LAB — URINE CULTURE

## 2011-02-16 LAB — URINE MICROSCOPIC-ADD ON

## 2011-02-16 LAB — URINALYSIS, ROUTINE W REFLEX MICROSCOPIC
Bilirubin Urine: NEGATIVE
Hgb urine dipstick: NEGATIVE
Specific Gravity, Urine: 1.02
Urobilinogen, UA: 0.2
pH: 7

## 2012-09-24 ENCOUNTER — Encounter (HOSPITAL_COMMUNITY): Payer: Self-pay | Admitting: Emergency Medicine

## 2012-09-24 ENCOUNTER — Emergency Department (HOSPITAL_COMMUNITY)
Admission: EM | Admit: 2012-09-24 | Discharge: 2012-09-24 | Disposition: A | Discharge status: Home / Self Care | Payer: Medicaid Other | Attending: Emergency Medicine | Admitting: Emergency Medicine

## 2012-09-24 DIAGNOSIS — F17200 Nicotine dependence, unspecified, uncomplicated: Secondary | ICD-10-CM | POA: Insufficient documentation

## 2012-09-24 DIAGNOSIS — M62830 Muscle spasm of back: Secondary | ICD-10-CM

## 2012-09-24 DIAGNOSIS — Z862 Personal history of diseases of the blood and blood-forming organs and certain disorders involving the immune mechanism: Secondary | ICD-10-CM | POA: Insufficient documentation

## 2012-09-24 DIAGNOSIS — M5380 Other specified dorsopathies, site unspecified: Secondary | ICD-10-CM | POA: Insufficient documentation

## 2012-09-24 MED ORDER — DIAZEPAM 5 MG PO TABS: 5.0000 mg | ORAL_TABLET | Freq: Two times a day (BID) | ORAL | Status: DC

## 2012-09-24 NOTE — ED Notes (Signed)
Pt reports pain in low back x 2 days, denies fall. Pain unresponsive to OTC meds

## 2012-09-24 NOTE — ED Provider Notes (Signed)
History    This chart was scribed for non-physician practitioner, Dorthula Matas PA-C working with Hurman Horn, MD by Donne Anon, ED Scribe. This patient was seen in room WTR5/WTR5 and the patient's care was started at 4:12 PM.   CSN: [id number]  Arrival date & time 09/24/12  1503   None     Chief Complaint  Patient presents with  . Back Pain     The history is provided by the patient. No language interpreter was used.   HPI Comments: Gabriella Snyder is a 37 y.o. female with hx of muscle spasms, who presents to the Emergency Department complaining of gradual onset, gradually worsening moderate left sided back pain which began 2 days ago. She reports bending over makes the pain worse. She denies any recent trauma, incontinence, nausea, vomiting or any other pain. She has tried Ibuprofen and Tylenol with little relief. The last time she experienced similar back pain was 5 years ago.  Past Medical History  Diagnosis Date  . Anemia   . Anemia     Past Surgical History  Procedure Laterality Date  . Cesarean section    . Dilation and curettage of uterus    . Tubal ligation      Family History  Problem Relation Age of Onset  . Diabetes Mother   . Hypertension Father     History  Substance Use Topics  . Smoking status: Current Every Day Smoker -- 0.50 packs/day    Types: Cigarettes  . Smokeless tobacco: Not on file  . Alcohol Use: Yes    OB History   Grav Para Term Preterm Abortions TAB SAB Ect Mult Living   8 5 5  0 2 0 1 0 0 5      Review of Systems  Constitutional: Negative for fever.  Gastrointestinal: Negative for nausea and vomiting.  Musculoskeletal: Positive for back pain.  All other systems reviewed and are negative.    Allergies  Review of patient's allergies indicates no known allergies.  Home Medications   Current Outpatient Rx  Name  Route  Sig  Dispense  Refill  . ibuprofen (ADVIL,MOTRIN) 200 MG tablet   Oral   Take 400 mg by mouth  every 6 (six) hours as needed for pain.         . diazepam (VALIUM) 5 MG tablet   Oral   Take 1 tablet (5 mg total) by mouth 2 (two) times daily.   10 tablet   0     BP 125/70  Pulse 68  Resp 16  SpO2 100%  Physical Exam  Nursing note and vitals reviewed. Constitutional: She is oriented to person, place, and time. She appears well-developed and well-nourished. No distress.  HENT:  Head: Normocephalic and atraumatic.  Eyes: EOM are normal.  Neck: Neck supple. No tracheal deviation present.  Cardiovascular: Normal rate.   Pulmonary/Chest: Effort normal. No respiratory distress.  Musculoskeletal:       Lumbar back: She exhibits decreased range of motion, tenderness, pain and spasm (left lumbar muscles are in spasm). She exhibits no bony tenderness, no swelling, no edema, no deformity, no laceration and normal pulse.       Back:  Neurological: She is alert and oriented to person, place, and time.  Skin: Skin is warm and dry.  Psychiatric: She has a normal mood and affect. Her behavior is normal.    ED Course  Procedures (including critical care time) DIAGNOSTIC STUDIES: Oxygen Saturation is 100% on room  air, normal by my interpretation.    COORDINATION OF CARE: 4:13 PM Discussed treatment plan which includes 5 mg Valium, Ibuprofen, warm moist compresses, and gentle massage with pt at bedside and pt agreed to plan.     Labs Reviewed - No data to display No results found.   1. Back muscle spasm       MDM  Patient with back pain. No neurological deficits. Patient is ambulatory. No warning symptoms of back pain including: loss of bowel or bladder control, night sweats, waking from sleep with back pain, unexplained fevers or weight loss, h/o cancer, IVDU, recent trauma. No concern for cauda equina, epidural abscess, or other serious cause of back pain. Conservative measures such as rest, ice/heat and pain medicine indicated with PCP follow-up if no improvement with  conservative management.   I personally performed the services described in this documentation, which was scribed in my presence. The recorded information has been reviewed and is accurate.     Dorthula Matas, PA-C 09/24/12 1623

## 2012-09-26 ENCOUNTER — Encounter (HOSPITAL_COMMUNITY): Payer: Self-pay | Admitting: *Deleted

## 2012-09-26 ENCOUNTER — Emergency Department (HOSPITAL_COMMUNITY)
Admission: EM | Admit: 2012-09-26 | Discharge: 2012-09-26 | Disposition: A | Discharge status: Home / Self Care | Payer: Medicaid Other | Attending: Emergency Medicine | Admitting: Emergency Medicine

## 2012-09-26 DIAGNOSIS — M5380 Other specified dorsopathies, site unspecified: Secondary | ICD-10-CM | POA: Insufficient documentation

## 2012-09-26 DIAGNOSIS — M62830 Muscle spasm of back: Secondary | ICD-10-CM

## 2012-09-26 DIAGNOSIS — F17200 Nicotine dependence, unspecified, uncomplicated: Secondary | ICD-10-CM | POA: Insufficient documentation

## 2012-09-26 DIAGNOSIS — Z862 Personal history of diseases of the blood and blood-forming organs and certain disorders involving the immune mechanism: Secondary | ICD-10-CM | POA: Insufficient documentation

## 2012-09-26 MED ORDER — HYDROCODONE-ACETAMINOPHEN 5-325 MG PO TABS
2.0000 | ORAL_TABLET | Freq: Once | ORAL | Status: AC
  Administered 2012-09-26: 2 via ORAL
  Filled 2012-09-26: qty 2

## 2012-09-26 MED ORDER — METHOCARBAMOL 500 MG PO TABS
500.0000 mg | ORAL_TABLET | Freq: Two times a day (BID) | ORAL | Status: DC
Start: 2012-09-26 — End: 2012-11-01

## 2012-09-26 MED ORDER — METHOCARBAMOL 500 MG PO TABS
500.0000 mg | ORAL_TABLET | Freq: Once | ORAL | Status: AC
  Administered 2012-09-26: 500 mg via ORAL
  Filled 2012-09-26: qty 1

## 2012-09-26 MED ORDER — HYDROCODONE-ACETAMINOPHEN 5-325 MG PO TABS: 2.0000 | ORAL_TABLET | Freq: Four times a day (QID) | ORAL | Status: DC | PRN

## 2012-09-26 MED ORDER — ONDANSETRON 8 MG PO TBDP
8.0000 mg | ORAL_TABLET | Freq: Once | ORAL | Status: AC
  Administered 2012-09-26: 8 mg via ORAL
  Filled 2012-09-26: qty 1

## 2012-09-26 NOTE — ED Notes (Signed)
Pt ambulating independently w/ steady gait on d/c in no acute distress, A&Ox4. D/c instructions reviewed w/ pt - pt denies any further questions or concerns at present. Rx given x2  

## 2012-09-26 NOTE — ED Notes (Signed)
Pt treated a couple of days ago; diagnosed with back spasms; states valium is not helping; continues to c/o lower back pain up to neck

## 2012-09-26 NOTE — ED Provider Notes (Signed)
History    This chart was scribed for non-physician practitioner Junious Silk PA-C working with Richardean Canal, MD by Smitty Pluck, ED scribe. This patient was seen in room WTR6/WTR6 and the patient's care was started at 10:54 PM.   CSN: [id number]  Arrival date & time 09/26/12  2147      Chief Complaint  Patient presents with  . Back Pain    The history is provided by the patient and medical records. No language interpreter was used.   HPI Comments: Gabriella Snyder is a 37 y.o. female who presents to the Emergency Department complaining of constant, moderate lower back pain onset 4 days ago. Pt reports that she was seen in ED 2 days ago, diagnosed with back spasms and given valium. She states that the valium is not providing relief. She reports the back pain is a burning sensation. Pt denies recent fall, recent injury to back, bowel/bladder incontinence, fever, chills, nausea, vomiting, diarrhea, weakness, cough, SOB and any other pain. She denies hx of CA. She denies using IV drugs.    Past Medical History  Diagnosis Date  . Anemia   . Anemia     Past Surgical History  Procedure Laterality Date  . Cesarean section    . Dilation and curettage of uterus    . Tubal ligation      Family History  Problem Relation Age of Onset  . Diabetes Mother   . Hypertension Father     History  Substance Use Topics  . Smoking status: Current Every Day Smoker -- 0.50 packs/day    Types: Cigarettes  . Smokeless tobacco: Not on file  . Alcohol Use: Yes    OB History   Grav Para Term Preterm Abortions TAB SAB Ect Mult Living   8 5 5  0 2 0 1 0 0 5      Review of Systems  Constitutional: Negative for fever and chills.  Respiratory: Negative for shortness of breath.   Gastrointestinal: Negative for nausea and vomiting.  Musculoskeletal: Positive for back pain.  Neurological: Negative for weakness.  All other systems reviewed and are negative.    Allergies  Review of patient's  allergies indicates no known allergies.  Home Medications   Current Outpatient Rx  Name  Route  Sig  Dispense  Refill  . diazepam (VALIUM) 5 MG tablet   Oral   Take 1 tablet (5 mg total) by mouth 2 (two) times daily.   10 tablet   0   . ibuprofen (ADVIL,MOTRIN) 200 MG tablet   Oral   Take 400 mg by mouth every 6 (six) hours as needed for pain.           BP 130/66  Pulse 69  Temp(Src) 98.5 F (36.9 C) (Oral)  Resp 20  Ht 5' 10.5" (1.791 m)  Wt 248 lb 8 oz (112.719 kg)  BMI 35.14 kg/m2  SpO2 100%  LMP 09/19/2012  Physical Exam  Nursing note and vitals reviewed. Constitutional: She is oriented to person, place, and time. She appears well-developed and well-nourished. No distress.  HENT:  Head: Normocephalic and atraumatic.  Right Ear: External ear normal.  Left Ear: External ear normal.  Nose: Nose normal.  Mouth/Throat: Oropharynx is clear and moist.  Eyes: Conjunctivae are normal.  Neck: Normal range of motion.  Cardiovascular: Normal rate, regular rhythm, normal heart sounds, intact distal pulses and normal pulses.   Pulmonary/Chest: Effort normal and breath sounds normal. No stridor. No respiratory distress. She  has no wheezes. She has no rales.  Abdominal: Soft. Normal appearance. She exhibits no distension. There is no tenderness. There is no rigidity, no rebound and no guarding.  Musculoskeletal: Normal range of motion.  Tender to palpation along left lumbar para-spinal muscles of back No bony tenderness No stepoff  No deformity   Neurological: She is alert and oriented to person, place, and time. She has normal strength and normal reflexes. No sensory deficit. She exhibits normal muscle tone. Gait normal.  Skin: Skin is warm and dry. She is not diaphoretic. No erythema.  Psychiatric: She has a normal mood and affect. Her behavior is normal.    ED Course  Procedures (including critical care time) DIAGNOSTIC STUDIES: Oxygen Saturation is 100% on room air,  normal by my interpretation.    COORDINATION OF CARE: 10:56 PM Discussed ED treatment with pt and pt agrees.     Labs Reviewed - No data to display No results found.   1. Spasm of muscle, back       MDM  Patient with back pain.  Previously evaluated 2 days ago. No improvement with valium. No neurological deficits and normal neuro exam.  Patient can walk but states is painful.  No loss of bowel or bladder control.  No concern for cauda equina.  No fever, night sweats, weight loss, h/o cancer, IVDU.  RICE protocol and pain medicine indicated and discussed with patient.       I personally performed the services described in this documentation, which was scribed in my presence. The recorded information has been reviewed and is accurate.     Mora Bellman, PA-C 09/26/12 2326

## 2012-09-27 NOTE — ED Provider Notes (Signed)
Medical screening examination/treatment/procedure(s) were performed by non-physician practitioner and as supervising physician I was immediately available for consultation/collaboration.   Richardean Canal, MD 09/27/12 [phone]

## 2012-10-01 NOTE — ED Provider Notes (Signed)
Medical screening examination/treatment/procedure(s) were performed by non-physician practitioner and as supervising physician I was immediately available for consultation/collaboration.   Hurman Horn, MD 10/01/12 [phone]

## 2012-11-01 ENCOUNTER — Inpatient Hospital Stay (HOSPITAL_COMMUNITY)
Admission: AD | Admit: 2012-11-01 | Discharge: 2012-11-01 | Disposition: A | Discharge status: Home / Self Care | Payer: Medicaid Other | Source: Ambulatory Visit | Attending: Obstetrics & Gynecology | Admitting: Obstetrics & Gynecology

## 2012-11-01 ENCOUNTER — Encounter (HOSPITAL_COMMUNITY): Payer: Self-pay | Admitting: *Deleted

## 2012-11-01 ENCOUNTER — Inpatient Hospital Stay (HOSPITAL_COMMUNITY): Payer: Medicaid Other

## 2012-11-01 DIAGNOSIS — N938 Other specified abnormal uterine and vaginal bleeding: Secondary | ICD-10-CM

## 2012-11-01 DIAGNOSIS — F17200 Nicotine dependence, unspecified, uncomplicated: Secondary | ICD-10-CM | POA: Insufficient documentation

## 2012-11-01 DIAGNOSIS — N949 Unspecified condition associated with female genital organs and menstrual cycle: Secondary | ICD-10-CM

## 2012-11-01 DIAGNOSIS — N926 Irregular menstruation, unspecified: Secondary | ICD-10-CM | POA: Insufficient documentation

## 2012-11-01 LAB — COMPREHENSIVE METABOLIC PANEL
AST: 13 U/L (ref 0–37)
Albumin: 3.5 g/dL (ref 3.5–5.2)
BUN: 12 mg/dL (ref 6–23)
Calcium: 9.6 mg/dL (ref 8.4–10.5)
Chloride: 102 mEq/L (ref 96–112)
Creatinine, Ser: 1.13 mg/dL — ABNORMAL HIGH (ref 0.50–1.10)
Total Protein: 7.9 g/dL (ref 6.0–8.3)

## 2012-11-01 LAB — CBC
MCH: 21.9 pg — ABNORMAL LOW (ref 26.0–34.0)
MCHC: 31.6 g/dL (ref 30.0–36.0)
MCV: 69.3 fL — ABNORMAL LOW (ref 78.0–100.0)
Platelets: 256 10*3/uL (ref 150–400)
RDW: 20.6 % — ABNORMAL HIGH (ref 11.5–15.5)
WBC: 10.6 10*3/uL — ABNORMAL HIGH (ref 4.0–10.5)

## 2012-11-01 LAB — WET PREP, GENITAL
Trich, Wet Prep: NONE SEEN
Yeast Wet Prep HPF POC: NONE SEEN

## 2012-11-01 LAB — POCT PREGNANCY, URINE: Preg Test, Ur: NEGATIVE

## 2012-11-01 LAB — URINALYSIS, ROUTINE W REFLEX MICROSCOPIC
Glucose, UA: NEGATIVE mg/dL
Ketones, ur: NEGATIVE mg/dL
Leukocytes, UA: NEGATIVE
Nitrite: NEGATIVE
Protein, ur: NEGATIVE mg/dL
pH: 6 (ref 5.0–8.0)

## 2012-11-01 MED ORDER — MEDROXYPROGESTERONE ACETATE 10 MG PO TABS: 10.0000 mg | ORAL_TABLET | Freq: Every day | ORAL | Status: DC

## 2012-11-01 MED ORDER — KETOROLAC TROMETHAMINE 60 MG/2ML IM SOLN
60.0000 mg | Freq: Once | INTRAMUSCULAR | Status: AC
  Administered 2012-11-01: 60 mg via INTRAMUSCULAR
  Filled 2012-11-01: qty 2

## 2012-11-01 NOTE — MAU Note (Signed)
Pt reports for the last month she has been having lower back spasms and has been to the doctor three times and is currently on meds for the spasms. States her LMP was about 3 days late and when it started on 06/16 it has not stopped. Has had constant lower abd pain since the bleeding started. States the bleeding has not been heavy.

## 2012-11-01 NOTE — MAU Provider Note (Signed)
History     CSN: [id number]  Arrival date and time: 11/01/12 3086   First Provider Initiated Contact with Patient 11/01/12 2016      Chief Complaint  Patient presents with  . Vaginal Bleeding  . Abdominal Pain   HPI  Gabriella Snyder is a 37 y.o. [id number] who presents today with an abnormal menstrual cycle. She states her period started on 6/05/14/12 and is typically only 3 days long, but she has been bleeding for 10 days. She states that the bleeding is heavy for about an hour and then becomes spotty, and she thinks it will stop, but the cycle continues. She is having menstrual cramps. She has also been having back pain. She has been using a muscle relaxer, and it did not help with the pain. She reports that she passes clots occasionally. She states the clots are about the size of a quarter.   Past Medical History  Diagnosis Date  . Anemia   . Anemia     Past Surgical History  Procedure Laterality Date  . Cesarean section    . Dilation and curettage of uterus    . Tubal ligation      Family History  Problem Relation Age of Onset  . Diabetes Mother   . Hypertension Father     History  Substance Use Topics  . Smoking status: Current Every Day Smoker -- 0.50 packs/day    Types: Cigarettes  . Smokeless tobacco: Not on file  . Alcohol Use: Yes    Allergies: No Known Allergies  Prescriptions prior to admission  Medication Sig Dispense Refill  . cyclobenzaprine (FLEXERIL) 10 MG tablet Take 10 mg by mouth daily as needed for muscle spasms.      . diclofenac (VOLTAREN) 75 MG EC tablet Take 75 mg by mouth at bedtime.      . diphenhydramine-acetaminophen (TYLENOL PM) 25-500 MG TABS Take 1 tablet by mouth at bedtime as needed (pain).      . methocarbamol (ROBAXIN) 500 MG tablet Take 500 mg by mouth at bedtime.      . traMADol (ULTRAM) 50 MG tablet Take 50 mg by mouth 2 (two) times daily as needed for pain.      . [DISCONTINUED] methocarbamol (ROBAXIN) 500 MG tablet Take 1  tablet (500 mg total) by mouth 2 (two) times daily.  20 tablet  0    ROS Physical Exam   Blood pressure 130/68, pulse 80, temperature 98.7 F (37.1 C), temperature source Oral, resp. rate 20, height 5\' 9"  (1.753 m), weight 242 lb (109.77 kg), last menstrual period 10/22/2012, SpO2 100.00%.  Physical Exam  Nursing note and vitals reviewed. Constitutional: She appears well-developed and well-nourished. No distress.  Cardiovascular: Normal rate.   Respiratory: Effort normal.  GI: Soft. There is no tenderness.  Genitourinary:   .External: no lesion Vagina: moderate amount of BRB in vagina Cervix: pink, smooth, no CMT Uterus: NSSC Adnexa: NT     MAU Course  Procedures  Results for orders placed during the hospital encounter of 11/01/12 (from the past 24 hour(s))  URINALYSIS, ROUTINE W REFLEX MICROSCOPIC     Status: Abnormal   Collection Time    11/01/12  7:22 PM      Result Value Range   Color, Urine YELLOW  YELLOW   APPearance CLEAR  CLEAR   Specific Gravity, Urine >1.030 (*) 1.005 - 1.030   pH 6.0  5.0 - 8.0   Glucose, UA NEGATIVE  NEGATIVE mg/dL   Hgb  urine dipstick LARGE (*) NEGATIVE   Bilirubin Urine NEGATIVE  NEGATIVE   Ketones, ur NEGATIVE  NEGATIVE mg/dL   Protein, ur NEGATIVE  NEGATIVE mg/dL   Urobilinogen, UA 1.0  0.0 - 1.0 mg/dL   Nitrite NEGATIVE  NEGATIVE   Leukocytes, UA NEGATIVE  NEGATIVE  URINE MICROSCOPIC-ADD ON     Status: None   Collection Time    11/01/12  7:22 PM      Result Value Range   Squamous Epithelial / LPF RARE  RARE   WBC, UA 0-2  <3 WBC/hpf   RBC / HPF 21-50  <3 RBC/hpf   Bacteria, UA RARE  RARE   Urine-Other MUCOUS PRESENT    POCT PREGNANCY, URINE     Status: None   Collection Time    11/01/12  7:53 PM      Result Value Range   Preg Test, Ur NEGATIVE  NEGATIVE  CBC     Status: Abnormal   Collection Time    11/01/12  8:25 PM      Result Value Range   WBC 10.6 (*) 4.0 - 10.5 K/uL   RBC 4.56  3.87 - 5.11 MIL/uL   Hemoglobin 10.0  (*) 12.0 - 15.0 g/dL   HCT 14.7 (*) 82.9 - 56.2 %   MCV 69.3 (*) 78.0 - 100.0 fL   MCH 21.9 (*) 26.0 - 34.0 pg   MCHC 31.6  30.0 - 36.0 g/dL   RDW 13.0 (*) 86.5 - 78.4 %   Platelets 256  150 - 400 K/uL  COMPREHENSIVE METABOLIC PANEL     Status: Abnormal   Collection Time    11/01/12  8:25 PM      Result Value Range   Sodium 137  135 - 145 mEq/L   Potassium 4.0  3.5 - 5.1 mEq/L   Chloride 102  96 - 112 mEq/L   CO2 26  19 - 32 mEq/L   Glucose, Bld 96  70 - 99 mg/dL   BUN 12  6 - 23 mg/dL   Creatinine, Ser 6.96 (*) 0.50 - 1.10 mg/dL   Calcium 9.6  8.4 - 29.5 mg/dL   Total Protein 7.9  6.0 - 8.3 g/dL   Albumin 3.5  3.5 - 5.2 g/dL   AST 13  0 - 37 U/L   ALT 6  0 - 35 U/L   Alkaline Phosphatase 69  39 - 117 U/L   Total Bilirubin 0.2 (*) 0.3 - 1.2 mg/dL   GFR calc non Af Amer 63 (*) >90 mL/min   GFR calc Af Amer 73 (*) >90 mL/min  WET PREP, GENITAL     Status: Abnormal   Collection Time    11/01/12  8:40 PM      Result Value Range   Yeast Wet Prep HPF POC NONE SEEN  NONE SEEN   Trich, Wet Prep NONE SEEN  NONE SEEN   Clue Cells Wet Prep HPF POC FEW (*) NONE SEEN   WBC, Wet Prep HPF POC FEW (*) NONE SEEN   US Transvaginal Non-ob  11/01/2012   *RADIOLOGY REPORT*  Clinical Data: Dysfunctional uterine bleeding.  TRANSABDOMINAL AND TRANSVAGINAL ULTRASOUND OF PELVIS  Technique:  Both transabdominal and transvaginal ultrasound examinations of the pelvis were performed including evaluation of the uterus, ovaries, adnexal regions, and pelvic cul-de-sac.  Comparison: None.  Findings:  Uterus: 9.0 x 6.1 x 5 point the centimeters.  A single fundal fibroid measures up to 1.2 cm anteriorly.  Otherwise echotexture is  normal.  Endometrium: Not well visualized, 4 mm.  Right Ovary: 2.5 x 1.9 x 1.2 cm.  Left Ovary: 2.5 x 1.3 x 2.5 cm.  Other Findings:  No adnexal masses.  Ovaries have unremarkable appearance.  No free fluid.  IMPRESSION: Small anterior fundal fibroid.  Otherwise unremarkable study.    Original Report Authenticated By: Charlett Nose, M.D.   US Pelvis Complete  11/01/2012   *RADIOLOGY REPORT*  Clinical Data: Dysfunctional uterine bleeding.  TRANSABDOMINAL AND TRANSVAGINAL ULTRASOUND OF PELVIS  Technique:  Both transabdominal and transvaginal ultrasound examinations of the pelvis were performed including evaluation of the uterus, ovaries, adnexal regions, and pelvic cul-de-sac.  Comparison: None.  Findings:  Uterus: 9.0 x 6.1 x 5 point the centimeters.  A single fundal fibroid measures up to 1.2 cm anteriorly.  Otherwise echotexture is normal.  Endometrium: Not well visualized, 4 mm.  Right Ovary: 2.5 x 1.9 x 1.2 cm.  Left Ovary: 2.5 x 1.3 x 2.5 cm.  Other Findings:  No adnexal masses.  Ovaries have unremarkable appearance.  No free fluid.  IMPRESSION: Small anterior fundal fibroid.  Otherwise unremarkable study.   Original Report Authenticated By: Charlett Nose, M.D.    Assessment and Plan  Dysfunctional uterine bleeding Patient is a smoker, about to be 37 RX: provera 10mg  qhs X 10 FU in GYN clinic Tawnya Crook 11/01/2012, 8:16 PM

## 2012-11-01 NOTE — MAU Note (Signed)
Pt reports vaginal bleeding bleeding for 10 days. Also Pt has been seeing a doctor for "back spasm" since May. Pt reports pain today to be different. It starts in her back and wraps around to her lower stomach.

## 2012-11-02 LAB — GC/CHLAMYDIA PROBE AMP
CT Probe RNA: NEGATIVE
GC Probe RNA: NEGATIVE

## 2013-11-10 ENCOUNTER — Encounter (HOSPITAL_COMMUNITY): Payer: Self-pay | Admitting: Emergency Medicine

## 2013-11-10 ENCOUNTER — Emergency Department (HOSPITAL_COMMUNITY)
Admission: EM | Admit: 2013-11-10 | Discharge: 2013-11-10 | Disposition: A | Discharge status: Home / Self Care | Payer: Medicaid Other | Attending: Emergency Medicine | Admitting: Emergency Medicine

## 2013-11-10 ENCOUNTER — Emergency Department (HOSPITAL_COMMUNITY): Payer: Medicaid Other

## 2013-11-10 DIAGNOSIS — Y939 Activity, unspecified: Secondary | ICD-10-CM | POA: Insufficient documentation

## 2013-11-10 DIAGNOSIS — Z862 Personal history of diseases of the blood and blood-forming organs and certain disorders involving the immune mechanism: Secondary | ICD-10-CM | POA: Diagnosis not present

## 2013-11-10 DIAGNOSIS — IMO0002 Reserved for concepts with insufficient information to code with codable children: Secondary | ICD-10-CM | POA: Insufficient documentation

## 2013-11-10 DIAGNOSIS — Z791 Long term (current) use of non-steroidal anti-inflammatories (NSAID): Secondary | ICD-10-CM | POA: Insufficient documentation

## 2013-11-10 DIAGNOSIS — S39013A Strain of muscle, fascia and tendon of pelvis, initial encounter: Secondary | ICD-10-CM

## 2013-11-10 DIAGNOSIS — X58XXXA Exposure to other specified factors, initial encounter: Secondary | ICD-10-CM | POA: Diagnosis not present

## 2013-11-10 DIAGNOSIS — Z79899 Other long term (current) drug therapy: Secondary | ICD-10-CM | POA: Insufficient documentation

## 2013-11-10 DIAGNOSIS — F17200 Nicotine dependence, unspecified, uncomplicated: Secondary | ICD-10-CM | POA: Insufficient documentation

## 2013-11-10 DIAGNOSIS — Y929 Unspecified place or not applicable: Secondary | ICD-10-CM | POA: Insufficient documentation

## 2013-11-10 DIAGNOSIS — M79609 Pain in unspecified limb: Secondary | ICD-10-CM | POA: Diagnosis present

## 2013-11-10 MED ORDER — KETOROLAC TROMETHAMINE 30 MG/ML IJ SOLN
30.0000 mg | Freq: Once | INTRAMUSCULAR | Status: AC
  Administered 2013-11-10: 30 mg via INTRAMUSCULAR
  Filled 2013-11-10: qty 1

## 2013-11-10 MED ORDER — NAPROXEN 500 MG PO TABS: 500.0000 mg | ORAL_TABLET | Freq: Two times a day (BID) | ORAL | Status: DC

## 2013-11-10 MED ORDER — METHOCARBAMOL 500 MG PO TABS: 500.0000 mg | ORAL_TABLET | Freq: Two times a day (BID) | ORAL | Status: DC

## 2013-11-10 MED ORDER — HYDROCODONE-ACETAMINOPHEN 5-325 MG PO TABS: 1.0000 | ORAL_TABLET | Freq: Four times a day (QID) | ORAL | Status: DC | PRN

## 2013-11-10 NOTE — ED Provider Notes (Signed)
CSN: [id number]     Arrival date & time 11/10/13  1010 History   This chart was scribed for non-physician practitioner working with Blanchie Dessert, MD, by Neta Ehlers, ED Scribe. This patient was seen in room TR05C/TR05C and the patient's care was started at 11:34 AM.  First MD Initiated Contact with Patient 11/10/13 1102     Chief Complaint  Patient presents with  . Leg Pain    The history is provided by the patient. No language interpreter was used.   HPI Comments: Gabriella Snyder is a 38 y.o. female who presents to the Emergency Department complaining of constant right hip pain which radiates to her right groin and which was present upon awakening yesterday. The pt rates the current pain as 8/10. She states she was ambulatory yesterday, but she experienced difficulty ambulating secondary to pain as the day progressed. Movement of the right hip and right leg also increase the pain. Ms. Payes used half of a percocet yesterday without resolution. She denies known injuries or falls to the hip. The pt also denies back pain, weakness, numbness, paresthesia, urinary/bowel incontinence, vaginal discharge, abdominal pain, fever, nausea, emesis, and diarrhea.    Past Medical History  Diagnosis Date  . Anemia   . Anemia    Past Surgical History  Procedure Laterality Date  . Cesarean section    . Dilation and curettage of uterus    . Tubal ligation     Family History  Problem Relation Age of Onset  . Diabetes Mother   . Hypertension Father    History  Substance Use Topics  . Smoking status: Current Every Day Smoker -- 0.50 packs/day    Types: Cigarettes  . Smokeless tobacco: Not on file  . Alcohol Use: Yes   OB History   Grav Para Term Preterm Abortions TAB SAB Ect Mult Living   8 5 5  0 2 0 1 0 0 5     Review of Systems  Constitutional: Negative for fever.  Gastrointestinal: Negative for nausea, vomiting, abdominal pain and diarrhea.  Genitourinary: Negative for urgency  and vaginal discharge.  Musculoskeletal: Positive for arthralgias. Negative for back pain.  Neurological: Negative for weakness and numbness.  All other systems reviewed and are negative.   Allergies  Review of patient's allergies indicates no known allergies.  Home Medications   Prior to Admission medications   Medication Sig Start Date End Date Taking? Authorizing Provider  cyclobenzaprine (FLEXERIL) 10 MG tablet Take 10 mg by mouth daily as needed for muscle spasms.    Historical Provider, MD  diclofenac (VOLTAREN) 75 MG EC tablet Take 75 mg by mouth at bedtime.    Historical Provider, MD  diphenhydramine-acetaminophen (TYLENOL PM) 25-500 MG TABS Take 1 tablet by mouth at bedtime as needed (pain).    Historical Provider, MD  medroxyPROGESTERone (PROVERA) 10 MG tablet Take 1 tablet (10 mg total) by mouth daily. 11/01/12   Hooper Petteway Erby Pian, CNM  methocarbamol (ROBAXIN) 500 MG tablet Take 500 mg by mouth at bedtime. 09/26/12   Elwyn Lade, PA-C  traMADol (ULTRAM) 50 MG tablet Take 50 mg by mouth 2 (two) times daily as needed for pain.    Historical Provider, MD   Triage Vitals: BP 115/67  Pulse 73  Temp(Src) 97.4 F (36.3 C) (Oral)  Resp 20  Ht 5' 10.5" (1.791 m)  Wt 235 lb (106.595 kg)  BMI 33.23 kg/m2  SpO2 100%  Physical Exam  Nursing note and vitals reviewed. Constitutional: She  is oriented to person, place, and time. She appears well-developed and well-nourished. No distress.  HENT:  Head: Normocephalic and atraumatic.  Eyes: Conjunctivae and EOM are normal.  Neck: Neck supple. No tracheal deviation present.  Cardiovascular: Normal rate, regular rhythm and normal heart sounds.   No murmur heard. Pulses:      Dorsalis pedis pulses are 2+ on the right side, and 2+ on the left side.  Pulmonary/Chest: Effort normal. No respiratory distress.  Abdominal: Soft. There is no tenderness.  Musculoskeletal: Normal range of motion.  Tenderness to palpation to right hip and  inguinal ligament. No obvious erythema, edema, or warmth.  No CVA tenderness.  Muscle strength 5/5 bilaterally.   Neurological: She is alert and oriented to person, place, and time.  Distal sensation of both feet intact.   Skin: Skin is warm and dry.  Psychiatric: She has a normal mood and affect. Her behavior is normal.    ED Course  Procedures (including critical care time)  DIAGNOSTIC STUDIES: Oxygen Saturation is 100% on room air, normal by my interpretation.    COORDINATION OF CARE:  11:42 AM- Discussed treatment plan with patient, and the patient agreed to the plan. The plan includes Toradol IM and imaging.   1:07 PM- Rechecked pt. Informed pt of imaging results. Informed pt that her symptoms are consistent with an inguinal ligament strain. Will prescribe pt pain medication, muscle relaxants, and NSAIDs.   Labs Review Labs Reviewed - No data to display  Imaging Review Dg Hip Complete Right  11/10/2013   CLINICAL DATA:  Shooting pain throughout the right hip  EXAM: RIGHT HIP - COMPLETE 2+ VIEW  COMPARISON:  None.  FINDINGS: There is no evidence of hip fracture or dislocation. There is no evidence of arthropathy or other focal bone abnormality.  IMPRESSION: Negative.   Electronically Signed   By: Kathreen Devoid   On: 11/10/2013 12:34     EKG Interpretation None      MDM   Final diagnoses:  None   Patient presenting with pain of the right hip and right inguinal ligament.  No known injury or trauma.  Pain worse with movement.  No abdominal pain.  No CVA tenderness.  Feel that the pain is most likely musculoskeletal due to the fact that it is worse with movement.  No signs of infection on exam.  Patient is neurovascularly intact.  Xray negative.  Feel that the patient is stable for discharge.  Patient discharged home with pain medication.  Return precautions given.  I personally performed the services described in this documentation, which was scribed in my presence. The  recorded information has been reviewed and is accurate.     Hyman Bible, PA-C 11/11/13 2234

## 2013-11-10 NOTE — ED Notes (Signed)
Pt. Stated, I started having rt. Leg pain yesterday. Denies any injury

## 2013-11-13 NOTE — ED Provider Notes (Signed)
Medical screening examination/treatment/procedure(s) were performed by non-physician practitioner and as supervising physician I was immediately available for consultation/collaboration.   EKG Interpretation None        Blanchie Dessert, MD 11/13/13 1437

## 2014-02-24 ENCOUNTER — Encounter (HOSPITAL_COMMUNITY): Payer: Self-pay | Admitting: Emergency Medicine

## 2014-02-24 ENCOUNTER — Emergency Department (HOSPITAL_COMMUNITY)
Admission: EM | Admit: 2014-02-24 | Discharge: 2014-02-24 | Disposition: A | Discharge status: Home / Self Care | Payer: Medicaid Other | Attending: Emergency Medicine | Admitting: Emergency Medicine

## 2014-02-24 DIAGNOSIS — Z79899 Other long term (current) drug therapy: Secondary | ICD-10-CM | POA: Diagnosis not present

## 2014-02-24 DIAGNOSIS — Z720 Tobacco use: Secondary | ICD-10-CM | POA: Diagnosis not present

## 2014-02-24 DIAGNOSIS — D649 Anemia, unspecified: Secondary | ICD-10-CM | POA: Diagnosis not present

## 2014-02-24 DIAGNOSIS — Z3202 Encounter for pregnancy test, result negative: Secondary | ICD-10-CM | POA: Insufficient documentation

## 2014-02-24 DIAGNOSIS — Z9889 Other specified postprocedural states: Secondary | ICD-10-CM | POA: Diagnosis not present

## 2014-02-24 DIAGNOSIS — Z90710 Acquired absence of both cervix and uterus: Secondary | ICD-10-CM | POA: Diagnosis not present

## 2014-02-24 DIAGNOSIS — Z791 Long term (current) use of non-steroidal anti-inflammatories (NSAID): Secondary | ICD-10-CM | POA: Diagnosis not present

## 2014-02-24 DIAGNOSIS — R197 Diarrhea, unspecified: Secondary | ICD-10-CM | POA: Insufficient documentation

## 2014-02-24 DIAGNOSIS — Z9851 Tubal ligation status: Secondary | ICD-10-CM | POA: Diagnosis not present

## 2014-02-24 DIAGNOSIS — N390 Urinary tract infection, site not specified: Secondary | ICD-10-CM | POA: Diagnosis not present

## 2014-02-24 DIAGNOSIS — Z8659 Personal history of other mental and behavioral disorders: Secondary | ICD-10-CM | POA: Diagnosis not present

## 2014-02-24 DIAGNOSIS — R109 Unspecified abdominal pain: Secondary | ICD-10-CM | POA: Diagnosis present

## 2014-02-24 DIAGNOSIS — M549 Dorsalgia, unspecified: Secondary | ICD-10-CM | POA: Diagnosis not present

## 2014-02-24 DIAGNOSIS — R112 Nausea with vomiting, unspecified: Secondary | ICD-10-CM | POA: Diagnosis not present

## 2014-02-24 HISTORY — DX: Anxiety disorder, unspecified: F41.9

## 2014-02-24 HISTORY — DX: Depression, unspecified: F32.A

## 2014-02-24 HISTORY — DX: Major depressive disorder, single episode, unspecified: F32.9

## 2014-02-24 LAB — CBC WITH DIFFERENTIAL/PLATELET
BASOS ABS: 0 10*3/uL (ref 0.0–0.1)
Basophils Relative: 0 % (ref 0–1)
EOS ABS: 0.1 10*3/uL (ref 0.0–0.7)
Eosinophils Relative: 1 % (ref 0–5)
HEMATOCRIT: 27.8 % — AB (ref 36.0–46.0)
Hemoglobin: 8.9 g/dL — ABNORMAL LOW (ref 12.0–15.0)
LYMPHS PCT: 17 % (ref 12–46)
Lymphs Abs: 1.7 10*3/uL (ref 0.7–4.0)
MCH: 21.8 pg — ABNORMAL LOW (ref 26.0–34.0)
MCHC: 32 g/dL (ref 30.0–36.0)
MCV: 68.1 fL — ABNORMAL LOW (ref 78.0–100.0)
Monocytes Absolute: 0.9 10*3/uL (ref 0.1–1.0)
Monocytes Relative: 9 % (ref 3–12)
NEUTROS ABS: 7.3 10*3/uL (ref 1.7–7.7)
NEUTROS PCT: 73 % (ref 43–77)
Platelets: 193 10*3/uL (ref 150–400)
RBC: 4.08 MIL/uL (ref 3.87–5.11)
RDW: 20.9 % — AB (ref 11.5–15.5)
WBC: 10 10*3/uL (ref 4.0–10.5)

## 2014-02-24 LAB — URINALYSIS, ROUTINE W REFLEX MICROSCOPIC
Bilirubin Urine: NEGATIVE
Glucose, UA: NEGATIVE mg/dL
Ketones, ur: NEGATIVE mg/dL
NITRITE: NEGATIVE
PROTEIN: 30 mg/dL — AB
Specific Gravity, Urine: 1.01 (ref 1.005–1.030)
UROBILINOGEN UA: 4 mg/dL — AB (ref 0.0–1.0)
pH: 7 (ref 5.0–8.0)

## 2014-02-24 LAB — PREGNANCY, URINE: PREG TEST UR: NEGATIVE

## 2014-02-24 LAB — LIPASE, BLOOD: LIPASE: 17 U/L (ref 11–59)

## 2014-02-24 LAB — COMPREHENSIVE METABOLIC PANEL
ALT: 5 U/L (ref 0–35)
ANION GAP: 13 (ref 5–15)
AST: 10 U/L (ref 0–37)
Albumin: 3.3 g/dL — ABNORMAL LOW (ref 3.5–5.2)
Alkaline Phosphatase: 58 U/L (ref 39–117)
BUN: 5 mg/dL — AB (ref 6–23)
CALCIUM: 9 mg/dL (ref 8.4–10.5)
CO2: 23 mEq/L (ref 19–32)
Chloride: 101 mEq/L (ref 96–112)
Creatinine, Ser: 1.08 mg/dL (ref 0.50–1.10)
GFR calc non Af Amer: 65 mL/min — ABNORMAL LOW (ref >90)
GFR, EST AFRICAN AMERICAN: 76 mL/min — AB (ref >90)
GLUCOSE: 111 mg/dL — AB (ref 70–99)
Potassium: 3.5 mEq/L — ABNORMAL LOW (ref 3.7–5.3)
SODIUM: 137 meq/L (ref 137–147)
TOTAL PROTEIN: 8 g/dL (ref 6.0–8.3)
Total Bilirubin: 0.5 mg/dL (ref 0.3–1.2)

## 2014-02-24 LAB — URINE MICROSCOPIC-ADD ON

## 2014-02-24 MED ORDER — ONDANSETRON HCL 4 MG/2ML IJ SOLN
4.0000 mg | Freq: Once | INTRAMUSCULAR | Status: AC
  Administered 2014-02-24: 4 mg via INTRAVENOUS
  Filled 2014-02-24: qty 2

## 2014-02-24 MED ORDER — CEPHALEXIN 500 MG PO CAPS: 500.0000 mg | ORAL_CAPSULE | Freq: Two times a day (BID) | ORAL | Status: DC

## 2014-02-24 MED ORDER — KETOROLAC TROMETHAMINE 30 MG/ML IJ SOLN
30.0000 mg | Freq: Once | INTRAMUSCULAR | Status: AC
  Administered 2014-02-24: 30 mg via INTRAVENOUS
  Filled 2014-02-24: qty 1

## 2014-02-24 MED ORDER — SODIUM CHLORIDE 0.9 % IV BOLUS (SEPSIS)
1000.0000 mL | Freq: Once | INTRAVENOUS | Status: AC
  Administered 2014-02-24: 1000 mL via INTRAVENOUS

## 2014-02-24 MED ORDER — SODIUM CHLORIDE 0.9 % IV BOLUS (SEPSIS)
1000.0000 mL | INTRAVENOUS | Status: AC
  Administered 2014-02-24: 1000 mL via INTRAVENOUS

## 2014-02-24 MED ORDER — ONDANSETRON HCL 4 MG PO TABS: 4.0000 mg | ORAL_TABLET | Freq: Four times a day (QID) | ORAL | Status: DC

## 2014-02-24 NOTE — ED Provider Notes (Signed)
CSN: [id number]     Arrival date & time 02/24/14  7902 History   First MD Initiated Contact with Patient 02/24/14 0845     Chief Complaint  Patient presents with  . Back Pain  . Abdominal Pain  . Emesis   (Consider location/radiation/quality/duration/timing/severity/associated sxs/prior Treatment) HPI Gabriella Snyder is a 38 yo female presenting with report of ... This started 3 days ago when she suddenly became very chilled, with her teeth chattering and felt like she had to wrap in warm blankets.  She also had 2 episodes of diarrhea. The next day she was hot and sweaty followed by chills.  She began vomiting that day.  She had multiple episodes of vomiting and diarrhea, this continued the following day.  She has had 1 episode of vomiting this am but no diarrhea. She also states she has started having back and abdominal pain.  She denies any bilious or bloody emesis, hematachezia or melena.  Past Medical History  Diagnosis Date  . Anemia   . Anemia   . Anxiety   . Depression    Past Surgical History  Procedure Laterality Date  . Cesarean section    . Dilation and curettage of uterus    . Tubal ligation     Family History  Problem Relation Age of Onset  . Diabetes Mother   . Hypertension Father    History  Substance Use Topics  . Smoking status: Current Every Day Smoker -- 0.50 packs/day    Types: Cigarettes  . Smokeless tobacco: Not on file  . Alcohol Use: Yes   OB History   Grav Para Term Preterm Abortions TAB SAB Ect Mult Living   8 5 5  0 2 0 1 0 0 5     Review of Systems  Constitutional: Negative for fever and chills.  HENT: Negative for sore throat.   Eyes: Negative for visual disturbance.  Respiratory: Negative for cough and shortness of breath.   Cardiovascular: Negative for chest pain and leg swelling.  Gastrointestinal: Positive for nausea, vomiting, abdominal pain and diarrhea.  Genitourinary: Negative for dysuria and flank pain.  Musculoskeletal:  Negative for myalgias.  Skin: Negative for rash.  Neurological: Negative for weakness, numbness and headaches.    Allergies  Review of patient's allergies indicates no known allergies.  Home Medications   Prior to Admission medications   Medication Sig Start Date End Date Taking? Authorizing Provider  diphenhydramine-acetaminophen (TYLENOL PM) 25-500 MG TABS Take 1 tablet by mouth at bedtime as needed.    Historical Provider, MD  HYDROcodone-acetaminophen (NORCO/VICODIN) 5-325 MG per tablet Take 1-2 tablets by mouth every 6 (six) hours as needed. 11/10/13   Heather Laisure, PA-C  methocarbamol (ROBAXIN) 500 MG tablet Take 1 tablet (500 mg total) by mouth 2 (two) times daily. 11/10/13   Heather Laisure, PA-C  naproxen (NAPROSYN) 500 MG tablet Take 1 tablet (500 mg total) by mouth 2 (two) times daily. 11/10/13   Heather Laisure, PA-C   BP 135/73  Pulse 75  Temp(Src) 99 F (37.2 C) (Oral)  Resp 20  Ht 5\' 9"  (1.753 m)  Wt 245 lb (111.131 kg)  BMI 36.16 kg/m2  SpO2 100%  LMP 02/18/2014 Physical Exam  Nursing note and vitals reviewed. Constitutional: She is oriented to person, place, and time. She appears well-developed and well-nourished. No distress.  HENT:  Head: Normocephalic and atraumatic.  Mouth/Throat: Mucous membranes are dry. No oropharyngeal exudate.  Eyes: Conjunctivae are normal.  Neck: Neck supple. No thyromegaly  present.  Cardiovascular: Normal rate, regular rhythm and intact distal pulses.   Pulmonary/Chest: Effort normal and breath sounds normal. No respiratory distress. She has no wheezes. She has no rales. She exhibits no tenderness.  Abdominal: Soft. She exhibits no distension and no mass. There is no hepatosplenomegaly. There is generalized tenderness. There is no rigidity, no rebound, no guarding, no CVA tenderness, no tenderness at McBurney's point and negative Murphy's sign.  Musculoskeletal: She exhibits no tenderness.  Lymphadenopathy:    She has no cervical  adenopathy.  Neurological: She is alert and oriented to person, place, and time.  Skin: Skin is warm and dry. No rash noted. She is not diaphoretic.  Psychiatric: She has a normal mood and affect.    ED Course  Procedures (including critical care time) Labs Review Labs Reviewed  CBC WITH DIFFERENTIAL - Abnormal; Notable for the following:    Hemoglobin 8.9 (*)    HCT 27.8 (*)    MCV 68.1 (*)    MCH 21.8 (*)    RDW 20.9 (*)    All other components within normal limits  COMPREHENSIVE METABOLIC PANEL - Abnormal; Notable for the following:    Potassium 3.5 (*)    Glucose, Bld 111 (*)    BUN 5 (*)    Albumin 3.3 (*)    GFR calc non Af Amer 65 (*)    GFR calc Af Amer 76 (*)    All other components within normal limits  URINALYSIS, ROUTINE W REFLEX MICROSCOPIC - Abnormal; Notable for the following:    Hgb urine dipstick SMALL (*)    Protein, ur 30 (*)    Urobilinogen, UA 4.0 (*)    Leukocytes, UA SMALL (*)    All other components within normal limits  URINE MICROSCOPIC-ADD ON - Abnormal; Notable for the following:    Bacteria, UA FEW (*)    All other components within normal limits  PREGNANCY, URINE  LIPASE, BLOOD    Imaging Review No results found.   EKG Interpretation None      MDM   Final diagnoses:  Nausea vomiting and diarrhea  Anemia, unspecified anemia type  UTI (lower urinary tract infection)   38 yo female with 3 days nausea, vomiting, diarrhea.  Abd pain generally TTP.    CBC, CMP, Lipase, UA, Upreg, Toradol and zofran, NS bolus    Labs reviewed, anemia noted, however close to pt's baseline with hx of anemia.  Urine with leukocytes. No other significant abnormalities.    Pain, nausea and vomiting improved after fluids and meds. Patient is nontoxic, nonseptic appearing, in no apparent distress.    On repeat exam patient does not have a surgical abdomen and there are no peritoneal signs.  No indication of appendicitis, bowel obstruction, bowel  perforation, cholecystitis, diverticulitis, PID or ectopic pregnancy.  Patient discharged home with symptomatic treatment and given strict instructions to establish care for follow-up with a primary care physician for these symptoms and for management of chronic anemia. Prescriptions provided for nausea meds and abx for tx of UTI.  I have also discussed reasons to return immediately to the ER.  Patient expresses understanding and agrees with plan.   Filed Vitals:   02/24/14 1100 02/24/14 1130 02/24/14 1300 02/24/14 1312  BP: 109/61 119/63 110/76   Pulse: 67 62 65   Temp:    98.9 F (37.2 C)  TempSrc:    Oral  Resp:  16    Height:      Weight:  SpO2: 98% 100% 100%    Meds given in ED:  Medications  ondansetron (ZOFRAN) injection 4 mg (4 mg Intravenous Given 02/24/14 0922)  ketorolac (TORADOL) 30 MG/ML injection 30 mg (30 mg Intravenous Given 02/24/14 0922)  sodium chloride 0.9 % bolus 1,000 mL (0 mLs Intravenous Stopped 02/24/14 1223)  sodium chloride 0.9 % bolus 1,000 mL (0 mLs Intravenous Stopped 02/24/14 1312)    Discharge Medication List as of 02/24/2014 12:38 PM    START taking these medications   Details  ondansetron (ZOFRAN) 4 MG tablet Take 1 tablet (4 mg total) by mouth every 6 (six) hours., Starting 02/24/2014, Until Discontinued, Print    cephALEXin (KEFLEX) 500 MG capsule Take 1 capsule (500 mg total) by mouth 2 (two) times daily., Starting 02/24/2014, Until Discontinued, Print          Britt Bottom, NP 02/28/14 1045

## 2014-02-24 NOTE — Discharge Instructions (Signed)
Please follow the directions provided.  Be sure to establish care with a primary care provider to follow-up on your vomiting and diarrhea but also to manage your chronic anemia.  You may take the zofran to help with nausea to allow you to drink to stay hydrated.  Please take the antibiotic as directed for the urinary tract infection.  Don't hesitate to return for new, worsening or concerning symptoms.    SEEK IMMEDIATE MEDICAL CARE IF:  You are unable to keep fluids down.  You do not urinate at least once every 6 to 8 hours.  You develop shortness of breath.  You notice blood in your stool or vomit. This may look like coffee grounds.  You have abdominal pain that increases or is concentrated in one small area (localized).  You have persistent vomiting or diarrhea.  You have a fever.   Emergency Department Resource Guide 1) Find a Doctor and Pay Out of Pocket Although you won't have to find out who is covered by your insurance plan, it is a good idea to ask around and get recommendations. You will then need to call the office and see if the doctor you have chosen will accept you as a new patient and what types of options they offer for patients who are self-pay. Some doctors offer discounts or will set up payment plans for their patients who do not have insurance, but you will need to ask so you aren't surprised when you get to your appointment.  2) Contact Your Local Health Department Not all health departments have doctors that can see patients for sick visits, but many do, so it is worth a call to see if yours does. If you don't know where your local health department is, you can check in your phone book. The CDC also has a tool to help you locate your state's health department, and many state websites also have listings of all of their local health departments.  3) Find a [facility] If your illness is not likely to be very severe or complicated, you may want to try a walk in clinic. These  are popping up all over the country in pharmacies, drugstores, and shopping centers. They're usually staffed by nurse practitioners or physician assistants that have been trained to treat common illnesses and complaints. They're usually fairly quick and inexpensive. However, if you have serious medical issues or chronic medical problems, these are probably not your best option.  No Primary Care Doctor: - Call Health Connect at  [phone] - they can help you locate a primary care doctor that  accepts your insurance, provides certain services, etc. - Physician Referral Service- [phone]  Chronic Pain Problems: Organization         Address  Phone   Notes  [facility]  [phone] Patients need to be referred by their primary care doctor.   Medication Assistance: Organization         Address  Phone   Notes  [facility] [place], [place] [address] [phone] --Must be a resident of [facility] -- Must have NO insurance coverage whatsoever (no Medicaid/ Medicare, etc.) -- The pt. MUST have a primary care doctor that directs their care regularly and follows them in the community   [facility]ist  [phone]   [employer]  [phone]    Agencies that provide inexpensive medical care: Organization  Address  Phone   Notes  [place]  [phone]   [facility]    [phone]   [facility] [place], [place] [address] [phone]   [place] [address] [place], [place] [phone]   Planned Parenthood    [phone]   [facility]    [phone]   [place] and [place] [address], [place] Phone:  [phone], Fax:  [phone] Hours of Operation:  9 am - 6 pm, M-F.  Also accepts Medicaid/Medicare and self-pay.  [facility] for [place], [address], [place] Phone: [phone], Fax: [phone]. Hours of Operation:  8:30 am - 5:30 pm, M-F.  Also accepts Medicaid and self-pay.  [facility] [address] [place] Phone: [phone]   [place], [place], [place] [phone], Ext. [phone] Mondays & Thursdays: 7-9 AM.  First 15 patients are seen on a first come, first serve basis.    [place] Providers:  Organization         Address  Phone   Notes  [facility] [address], [place] [phone] Also accepts self-pay patients.  [facility] [address], [place]  [phone]   [place], [address], [place] [phone]   [facility] [address], [place] [phone]   Lucianne Lei [address], [place]   [phone] Only accepts [place] Access [place] patients after they have their name applied to their card.   Self-Pay (no insurance) in [facility]:  Organization         Address  Phone   Notes  Sickle Cell Patients, [facility] [place] [phone]   [facility] [place] [phone]   [facility] [place]  [place], [place], [place] [phone]   [facility]/Dr. Osei-Bonsu  [address], Maria Stein or [address], [place] [phone] Phone number for both [place] and [place] locations is the same.  [facility] [address] [place] [phone]   [facility] [address] [place] or [address] [phone]   [facility] [address] [place] [phone], phone; [phone], fax Sees patients 1st and 3rd  Saturday of every month.  Must not qualify for public or private insurance (i.e. Medicaid, Medicare, [place] Health Choice, Veterans' Benefits)  Household income should be no more than 200% of the poverty level The clinic cannot treat you if you are pregnant or think you are pregnant  Sexually transmitted diseases are not treated at the clinic.    Dental Care: Organization         Address  Phone  Notes  [facility] Department of [facility] [place] [phone] Accepts children up to age 65 who are enrolled in [place] or [place]; pregnant women with a Medicaid card; and children who have applied for Medicaid or [place] Health Choice, but were declined, whose parents can pay a reduced fee at time  of service.  [facility] Department of [facility]  [address], [place] [phone] Accepts children up to age 62 who are enrolled in [place] or [place]; pregnant women with a Medicaid card; and children who have applied for Medicaid or [place] Health Choice, but were declined, whose parents can pay a reduced fee at time of service.  [place] Adult Dental Access PROGRAM  [place] [phone] Patients are seen by appointment only. Walk-ins are not accepted. [place] will see patients 49 years of age and older. Monday - Tuesday (8am-5pm) Most Wednesdays (8:30-5pm) $30 per visit, cash only  [facility] Adult Dental Access PROGRAM  [address], [facility] [phone] Patients are seen by appointment only. Walk-ins are not accepted. [place] will see patients 29 years of age and older. One Wednesday Evening (Monthly: Volunteer Based).  $30 per visit, cash only  [place]  [phone] for adults; Children under age 86, call Graduate Pediatric Dentistry at [phone]. Children aged 38-14, please call [phone] to request a pediatric  application.  Dental services are provided in all areas of dental care including fillings, crowns and bridges, complete and partial dentures, implants, gum treatment, root canals, and extractions. Preventive care is also provided. Treatment is provided to both adults and children. Patients are selected via a lottery and there is often a waiting list.   [facility] [address] [place]  [phone] [email or web address]   [facility] [address] [place], [place] [phone], Ext. [phone] Second and Fourth Thursday of each month, opens at 6:30 AM; Clinic ends at 9 AM.  Patients are seen on a first-come first-served basis, and a limited number are seen during each clinic.   [facility]  [address] Hillard Danker [place], [place] [phone]   Eligibility Requirements You must have lived in [place], [place], or [place] counties for at least the last three months.   You cannot be eligible for state or federal sponsored [employer], including [employer], [place], or [employer].   You generally cannot be eligible for healthcare insurance through your employer.    How to apply: Eligibility screenings are held every Tuesday and Wednesday afternoon from 1:00 pm until 4:00 pm. You do not need an appointment for the interview!  [facility] [address] [place], [place]   [place]  [place] Department  [place]  [phone]    Behavioral Health Resources in the Community: Intensive Outpatient Programs Organization         Address  Phone  Notes  [place]. [address], [place], [place] [phone]   [facility] Outpatient [address], [place], [place]   ADS: Alcohol & Drug Svcs [address], [place], [place]   [place]  [address]  [place], [place] or [phone]   Substance Abuse Resources Organization         Address  Phone  Notes  Alcohol and Drug Services  [phone]   [place]  [phone]   [facility] [place]   Chinita Pester  [phone]   Residential & Outpatient Substance Abuse Program  [phone]   Psychological Services Organization         Address  Phone  Notes  [facility]  [place]  [phone]   [place] [address] [place] or [phone]    Mobile Crisis Teams Organization         Address  Phone  Notes  [facility], Mobile Crisis Care Unit  [phone]   [facility]  [address] [place], [place]   [place] Levels [address], [place] [phone]    Self-Help/Support Groups Organization         Address  Phone             Notes  [place]. of [place] - variety of support groups  [place] Call for more information  Narcotics Anonymous (NA), [facility]aring [facility] [address], [employer] [place]  2 meetings at this location   Special educational needs teacher         Address  Phone  Notes  [facility] Residential Treatment [place],    [place]  1-(937) [phone]   [facility]  [address] [place] [address]016, [place], [place]   [place], [place] [phone] Admissions: 8am-3pm M-F  Incentives Substance [place] [address]    [place], [place] [phone]   [facility] [address] [place], [place], [place]   [facility] [address]  [place], [place]   [facility] - Intensive Outpatient [address], [place], [place]   [facility] [place].,    [place], [place] [phone] or [phone]   [facility] ([facility]) [address] [place], [place] Accepts Medicaid  [facility] [place] [address],  [place] [phone] Substance Abuse/Addiction Treatment   [facility] Organization         Address  Phone  Notes  [facility]  [phone]   Domenic Schwab, PhD [address] Arlis [place], [place]   [phone] or [phone]   [place], [place] [phone]   [facility] [address], [place], [place] [phone] Insurance/Medicaid/sponsorship through [facility] and Families [address]                                    [place], [place] [phone] [place] [address], [place] [phone]    Dr. Adele Schilder  [phone]   [facility] Dept. 1) [address], [place] 2) [place] 3)  [place] [address], [place] [phone]  [phone]   [place] [phone] or [phone] (After Hours)

## 2014-02-24 NOTE — ED Notes (Signed)
Patient ambulated to restroom and tolerated well.  

## 2014-02-24 NOTE — ED Notes (Addendum)
Patient states she started having chills and emesis on Friday.   Patient states has persisted until today with increasing N & V.   Patient states back pain and abdominal pain throughout.  Denies urinary symptoms.

## 2014-02-25 ENCOUNTER — Inpatient Hospital Stay (HOSPITAL_COMMUNITY)
Admission: EM | Admit: 2014-02-25 | Discharge: 2014-02-27 | DRG: 690 | Disposition: A | Discharge status: Home / Self Care | Payer: Medicaid Other | Attending: Internal Medicine | Admitting: Internal Medicine

## 2014-02-25 ENCOUNTER — Emergency Department (HOSPITAL_COMMUNITY): Payer: Medicaid Other

## 2014-02-25 ENCOUNTER — Encounter (HOSPITAL_COMMUNITY): Payer: Self-pay | Admitting: Emergency Medicine

## 2014-02-25 DIAGNOSIS — N10 Acute tubulo-interstitial nephritis: Principal | ICD-10-CM | POA: Diagnosis present

## 2014-02-25 DIAGNOSIS — Z23 Encounter for immunization: Secondary | ICD-10-CM | POA: Diagnosis not present

## 2014-02-25 DIAGNOSIS — D509 Iron deficiency anemia, unspecified: Secondary | ICD-10-CM

## 2014-02-25 DIAGNOSIS — R1115 Cyclical vomiting syndrome unrelated to migraine: Secondary | ICD-10-CM

## 2014-02-25 DIAGNOSIS — E872 Acidosis: Secondary | ICD-10-CM | POA: Diagnosis present

## 2014-02-25 DIAGNOSIS — E876 Hypokalemia: Secondary | ICD-10-CM | POA: Diagnosis present

## 2014-02-25 DIAGNOSIS — B9689 Other specified bacterial agents as the cause of diseases classified elsewhere: Secondary | ICD-10-CM

## 2014-02-25 DIAGNOSIS — N760 Acute vaginitis: Secondary | ICD-10-CM | POA: Diagnosis present

## 2014-02-25 DIAGNOSIS — N12 Tubulo-interstitial nephritis, not specified as acute or chronic: Secondary | ICD-10-CM

## 2014-02-25 DIAGNOSIS — F17210 Nicotine dependence, cigarettes, uncomplicated: Secondary | ICD-10-CM | POA: Diagnosis present

## 2014-02-25 DIAGNOSIS — R0602 Shortness of breath: Secondary | ICD-10-CM

## 2014-02-25 DIAGNOSIS — R112 Nausea with vomiting, unspecified: Secondary | ICD-10-CM | POA: Diagnosis present

## 2014-02-25 DIAGNOSIS — R079 Chest pain, unspecified: Secondary | ICD-10-CM

## 2014-02-25 HISTORY — DX: Personal history of other medical treatment: Z92.89

## 2014-02-25 HISTORY — DX: Cardiac murmur, unspecified: R01.1

## 2014-02-25 HISTORY — DX: Tubulo-interstitial nephritis, not specified as acute or chronic: N12

## 2014-02-25 HISTORY — DX: Migraine, unspecified, not intractable, without status migrainosus: G43.909

## 2014-02-25 LAB — I-STAT BETA HCG BLOOD, ED (MC, WL, AP ONLY): I-stat hCG, quantitative: <5 m[IU]/mL (ref <5)

## 2014-02-25 LAB — URINE MICROSCOPIC-ADD ON

## 2014-02-25 LAB — URINALYSIS, ROUTINE W REFLEX MICROSCOPIC
Bilirubin Urine: NEGATIVE
GLUCOSE, UA: NEGATIVE mg/dL
HGB URINE DIPSTICK: NEGATIVE
Ketones, ur: 40 mg/dL — AB
Nitrite: NEGATIVE
Protein, ur: NEGATIVE mg/dL
SPECIFIC GRAVITY, URINE: 1.008 (ref 1.005–1.030)
UROBILINOGEN UA: 4 mg/dL — AB (ref 0.0–1.0)
pH: 7.5 (ref 5.0–8.0)

## 2014-02-25 LAB — BASIC METABOLIC PANEL
Anion gap: 19 — ABNORMAL HIGH (ref 5–15)
BUN: 7 mg/dL (ref 6–23)
CO2: 18 mEq/L — ABNORMAL LOW (ref 19–32)
Calcium: 9.4 mg/dL (ref 8.4–10.5)
Chloride: 101 mEq/L (ref 96–112)
Creatinine, Ser: 1.11 mg/dL — ABNORMAL HIGH (ref 0.50–1.10)
GFR calc Af Amer: 73 mL/min — ABNORMAL LOW (ref >90)
GFR, EST NON AFRICAN AMERICAN: 63 mL/min — AB (ref >90)
GLUCOSE: 92 mg/dL (ref 70–99)
Potassium: 3.3 mEq/L — ABNORMAL LOW (ref 3.7–5.3)
Sodium: 138 mEq/L (ref 137–147)

## 2014-02-25 LAB — WET PREP, GENITAL
Trich, Wet Prep: NONE SEEN
WBC, Wet Prep HPF POC: NONE SEEN
Yeast Wet Prep HPF POC: NONE SEEN

## 2014-02-25 LAB — CBC WITH DIFFERENTIAL/PLATELET
BASOS PCT: 0 % (ref 0–1)
Basophils Absolute: 0 10*3/uL (ref 0.0–0.1)
Eosinophils Absolute: 0.1 10*3/uL (ref 0.0–0.7)
Eosinophils Relative: 1 % (ref 0–5)
HEMATOCRIT: 29 % — AB (ref 36.0–46.0)
HEMOGLOBIN: 9.1 g/dL — AB (ref 12.0–15.0)
Lymphocytes Relative: 21 % (ref 12–46)
Lymphs Abs: 2.2 10*3/uL (ref 0.7–4.0)
MCH: 22 pg — AB (ref 26.0–34.0)
MCHC: 31.4 g/dL (ref 30.0–36.0)
MCV: 70 fL — ABNORMAL LOW (ref 78.0–100.0)
Monocytes Absolute: 0.7 10*3/uL (ref 0.1–1.0)
Monocytes Relative: 7 % (ref 3–12)
NEUTROS ABS: 7.3 10*3/uL (ref 1.7–7.7)
Neutrophils Relative %: 71 % (ref 43–77)
Platelets: 218 10*3/uL (ref 150–400)
RBC: 4.14 MIL/uL (ref 3.87–5.11)
RDW: 21 % — AB (ref 11.5–15.5)
WBC: 10.3 10*3/uL (ref 4.0–10.5)

## 2014-02-25 LAB — POC OCCULT BLOOD, ED: Fecal Occult Bld: NEGATIVE

## 2014-02-25 LAB — I-STAT TROPONIN, ED: TROPONIN I, POC: 0.02 ng/mL (ref 0.00–0.08)

## 2014-02-25 LAB — I-STAT CG4 LACTIC ACID, ED: Lactic Acid, Venous: 3.46 mmol/L — ABNORMAL HIGH (ref 0.5–2.2)

## 2014-02-25 MED ORDER — METOCLOPRAMIDE HCL 5 MG/ML IJ SOLN
10.0000 mg | Freq: Once | INTRAMUSCULAR | Status: AC
  Administered 2014-02-25: 10 mg via INTRAVENOUS
  Filled 2014-02-25: qty 2

## 2014-02-25 MED ORDER — PROMETHAZINE HCL 25 MG/ML IJ SOLN
25.0000 mg | INTRAMUSCULAR | Status: DC | PRN
  Administered 2014-02-26 – 2014-02-27 (×4): 25 mg via INTRAVENOUS
  Filled 2014-02-25 (×4): qty 1

## 2014-02-25 MED ORDER — POTASSIUM CHLORIDE 10 MEQ/100ML IV SOLN
10.0000 meq | INTRAVENOUS | Status: AC
  Administered 2014-02-25 – 2014-02-26 (×5): 10 meq via INTRAVENOUS
  Filled 2014-02-25 (×5): qty 100

## 2014-02-25 MED ORDER — IOHEXOL 300 MG/ML  SOLN
80.0000 mL | Freq: Once | INTRAMUSCULAR | Status: AC | PRN
  Administered 2014-02-25: 80 mL via INTRAVENOUS

## 2014-02-25 MED ORDER — ENOXAPARIN SODIUM 60 MG/0.6ML ~~LOC~~ SOLN
55.0000 mg | SUBCUTANEOUS | Status: DC
Start: 2014-02-25 — End: 2014-02-25
  Filled 2014-02-25: qty 0.6

## 2014-02-25 MED ORDER — CEFTRIAXONE SODIUM 1 G IJ SOLR
1.0000 g | Freq: Once | INTRAMUSCULAR | Status: AC
  Administered 2014-02-25: 1 g via INTRAVENOUS
  Filled 2014-02-25: qty 10

## 2014-02-25 MED ORDER — MORPHINE SULFATE 4 MG/ML IJ SOLN
4.0000 mg | Freq: Once | INTRAMUSCULAR | Status: AC
  Administered 2014-02-25: 4 mg via INTRAVENOUS
  Filled 2014-02-25: qty 1

## 2014-02-25 MED ORDER — SODIUM CHLORIDE 0.9 % IV BOLUS (SEPSIS)
2000.0000 mL | Freq: Once | INTRAVENOUS | Status: AC
  Administered 2014-02-25: 2000 mL via INTRAVENOUS

## 2014-02-25 MED ORDER — ONDANSETRON HCL 4 MG/2ML IJ SOLN
4.0000 mg | Freq: Once | INTRAMUSCULAR | Status: AC
  Administered 2014-02-25: 4 mg via INTRAVENOUS
  Filled 2014-02-25: qty 2

## 2014-02-25 MED ORDER — ONDANSETRON 4 MG PO TBDP
4.0000 mg | ORAL_TABLET | Freq: Once | ORAL | Status: AC
  Administered 2014-02-25: 4 mg via ORAL
  Filled 2014-02-25: qty 1

## 2014-02-25 MED ORDER — SODIUM CHLORIDE 0.9 % IV SOLN
INTRAVENOUS | Status: DC
  Administered 2014-02-25: 23:00:00 via INTRAVENOUS

## 2014-02-25 MED ORDER — SODIUM CHLORIDE 0.9 % IV BOLUS (SEPSIS)
1000.0000 mL | Freq: Once | INTRAVENOUS | Status: AC
  Administered 2014-02-25: 1000 mL via INTRAVENOUS

## 2014-02-25 MED ORDER — SODIUM CHLORIDE 0.9 % IV BOLUS (SEPSIS): 1000.0000 mL | Freq: Once | INTRAVENOUS | Status: DC

## 2014-02-25 MED ORDER — PANTOPRAZOLE SODIUM 40 MG IV SOLR
40.0000 mg | INTRAVENOUS | Status: DC
  Administered 2014-02-25: 40 mg via INTRAVENOUS
  Filled 2014-02-25 (×2): qty 40

## 2014-02-25 MED ORDER — ENOXAPARIN SODIUM 40 MG/0.4ML ~~LOC~~ SOLN: 40.0000 mg | SUBCUTANEOUS | Status: DC

## 2014-02-25 MED ORDER — IOHEXOL 300 MG/ML  SOLN: 25.0000 mL | INTRAMUSCULAR | Status: DC

## 2014-02-25 MED ORDER — MORPHINE SULFATE 2 MG/ML IJ SOLN
2.0000 mg | Freq: Once | INTRAMUSCULAR | Status: AC
  Administered 2014-02-25: 2 mg via INTRAVENOUS
  Filled 2014-02-25: qty 1

## 2014-02-25 MED ORDER — MORPHINE SULFATE 2 MG/ML IJ SOLN: 2.0000 mg | INTRAMUSCULAR | Status: DC | PRN

## 2014-02-25 MED ORDER — HYDROMORPHONE HCL 1 MG/ML IJ SOLN
0.5000 mg | INTRAMUSCULAR | Status: DC | PRN
  Administered 2014-02-25 – 2014-02-27 (×6): 0.5 mg via INTRAVENOUS
  Filled 2014-02-25 (×6): qty 1

## 2014-02-25 MED ORDER — PNEUMOCOCCAL VAC POLYVALENT 25 MCG/0.5ML IJ INJ
0.5000 mL | INJECTION | INTRAMUSCULAR | Status: DC
  Filled 2014-02-25: qty 0.5

## 2014-02-25 MED ORDER — ENOXAPARIN SODIUM 60 MG/0.6ML ~~LOC~~ SOLN
50.0000 mg | SUBCUTANEOUS | Status: DC
Start: 2014-02-25 — End: 2014-02-27
  Administered 2014-02-25 – 2014-02-26 (×2): 50 mg via SUBCUTANEOUS
  Filled 2014-02-25 (×4): qty 0.6

## 2014-02-25 MED ORDER — ONDANSETRON HCL 4 MG/2ML IJ SOLN
4.0000 mg | Freq: Three times a day (TID) | INTRAMUSCULAR | Status: AC | PRN
  Administered 2014-02-26: 4 mg via INTRAVENOUS
  Filled 2014-02-25: qty 2

## 2014-02-25 MED ORDER — DIPHENHYDRAMINE HCL 50 MG/ML IJ SOLN
12.5000 mg | Freq: Once | INTRAMUSCULAR | Status: AC
  Administered 2014-02-25: 12.5 mg via INTRAVENOUS
  Filled 2014-02-25: qty 1

## 2014-02-25 MED ORDER — INFLUENZA VAC SPLIT QUAD 0.5 ML IM SUSY
0.5000 mL | PREFILLED_SYRINGE | INTRAMUSCULAR | Status: AC
  Administered 2014-02-27: 0.5 mL via INTRAMUSCULAR
  Filled 2014-02-25: qty 0.5

## 2014-02-25 NOTE — ED Notes (Signed)
Pt remains monitored by blood pressure, pulse ox, and 5 lead. Pts family remains at bedside.  

## 2014-02-25 NOTE — Progress Notes (Signed)
ANTICOAGULATION CONSULT NOTE - Initial Consult (pharmacy may adjust lovenox)  Pharmacy Consult for Lovenox Indication: VTE prophylaxis  No Known Allergies  Patient Measurements: Height: 5\' 10"  (177.8 cm) Weight: 223 lb 6.4 oz (101.334 kg) IBW/kg (Calculated) : 68.5   Vital Signs: Temp: 97.3 F (36.3 C) (10/20 2158) Temp Source: Oral (10/20 2158) BP: 120/55 mmHg (10/20 2158) Pulse Rate: 80 (10/20 2158)  Labs:  Recent Labs  02/24/14 0907 02/25/14 1409  HGB 8.9* 9.1*  HCT 27.8* 29.0*  PLT 193 218  CREATININE 1.08 1.11*    Estimated Creatinine Clearance: 90.3 ml/min (by C-G formula based on Cr of 1.11).   Medical History: Past Medical History  Diagnosis Date  . Anemia   . Anemia   . Anxiety   . Depression     Medications:  Prescriptions prior to admission  Medication Sig Dispense Refill  . cephALEXin (KEFLEX) 500 MG capsule Take 1 capsule (500 mg total) by mouth 2 (two) times daily.  14 capsule  0  . Ferrous Sulfate 27 MG TABS Take 27 mg by mouth daily.      . naproxen sodium (ANAPROX) 220 MG tablet Take 220 mg by mouth 2 (two) times daily as needed (for headache).      . ondansetron (ZOFRAN) 4 MG tablet Take 1 tablet (4 mg total) by mouth every 6 (six) hours.  12 tablet  0   Scheduled:  . enoxaparin (LOVENOX) injection  55 mg Subcutaneous Q24H  . pantoprazole (PROTONIX) IV  40 mg Intravenous Q24H    Assessment: 38 y.o female c/o vomiting with fevers, diarrhea, pt states she was seen for same yesterday and dx with a UTI, patient came back to ED today due to worsening symptoms. Lovenox for VTE prophylaxis ordered. Patient is obese, BMI = 32.1, Hgb 9.1, Hct 29, PLTC 219 No bleeding reported- She reports approximately 8 episodes of nonbloody, nonbilious emesis and associated  2 episodes of diarrhea without melena or hematochezia.    Goal of Therapy:  Heparin level 0.3-0.6 units/ml  4hours after dose given. Monitor platelets by anticoagulation protocol: Yes    Plan:  Lovenox 50mg  SQ q24h for VTE prophylaxis.  Nicole Cella, RPh Clinical Pharmacist Pager: [phone]  02/25/2014,10:01 PM

## 2014-02-25 NOTE — ED Provider Notes (Signed)
CSN: [id number]     Arrival date & time 02/25/14  1303 History   First MD Initiated Contact with Patient 02/25/14 1511     Chief Complaint  Patient presents with  . Emesis  . Fever     (Consider location/radiation/quality/duration/timing/severity/associated sxs/prior Treatment) The history is provided by the patient and medical records. No language interpreter was used.    Gabriella Snyder is a 38 y.o. female  with a hx of anemia presents to the Emergency Department complaining of gradual, persistent, progressively worsening generalized abdominal pain with associated nausea vomiting and diarrhea onset 5 days ago. Patient reports she was seen yesterday and diagnosed with a urinary tract infection however she's been unable to keep down the antibiotic. She reports approximately 8 episodes of nonbloody, nonbilious emesis and associated 2 episodes of diarrhea without melena or hematochezia.  Patient reports of anything she eat or drinks causes her to have vomiting and diarrhea. She denies sick contacts. She denies personal or family history of Crohn's or UC. She denies history of kidney stones.  Patient reports she's never had any pain like this. She also reports associated chest pressure with these symptoms.  Patient reports no recent hospitalizations however 2 months ago she took amoxicillin for dental infection, no other antibiotics.  Pt denies fever, headache and neck pain, shortness of breath, dizziness, syncope.     Past Medical History  Diagnosis Date  . Anemia   . Anemia   . Anxiety   . Depression    Past Surgical History  Procedure Laterality Date  . Cesarean section    . Dilation and curettage of uterus    . Tubal ligation     Family History  Problem Relation Age of Onset  . Diabetes Mother   . Hypertension Father    History  Substance Use Topics  . Smoking status: Current Every Day Smoker -- 0.50 packs/day    Types: Cigarettes  . Smokeless tobacco: Not on file  .  Alcohol Use: Yes   OB History   Grav Para Term Preterm Abortions TAB SAB Ect Mult Living   8 5 5  0 2 0 1 0 0 5     Review of Systems  Constitutional: Positive for chills and fatigue. Negative for fever, diaphoresis, appetite change and unexpected weight change.  HENT: Negative for mouth sores and trouble swallowing.   Respiratory: Negative for cough, chest tightness, shortness of breath, wheezing and stridor.   Cardiovascular: Negative for chest pain and palpitations.  Gastrointestinal: Positive for nausea, vomiting, abdominal pain and diarrhea. Negative for constipation, blood in stool, abdominal distention and rectal pain.  Genitourinary: Positive for flank pain (bilateral). Negative for dysuria, urgency, frequency, hematuria and difficulty urinating.  Musculoskeletal: Negative for back pain, neck pain and neck stiffness.  Skin: Negative for rash.  Neurological: Negative for weakness.  Hematological: Negative for adenopathy.  Psychiatric/Behavioral: Negative for confusion.  All other systems reviewed and are negative.     Allergies  Review of patient's allergies indicates no known allergies.  Home Medications   Prior to Admission medications   Medication Sig Start Date End Date Taking? Authorizing Provider  cephALEXin (KEFLEX) 500 MG capsule Take 1 capsule (500 mg total) by mouth 2 (two) times daily. 02/24/14  Yes Britt Bottom, NP  Ferrous Sulfate 27 MG TABS Take 27 mg by mouth daily.   Yes Historical Provider, MD  naproxen sodium (ANAPROX) 220 MG tablet Take 220 mg by mouth 2 (two) times daily as needed (for headache).  Yes Historical Provider, MD  ondansetron (ZOFRAN) 4 MG tablet Take 1 tablet (4 mg total) by mouth every 6 (six) hours. 02/24/14  Yes Britt Bottom, NP   BP 115/63  Pulse 68  Temp(Src) 98.3 F (36.8 C) (Oral)  Resp 15  Ht 5\' 10"  (1.778 m)  Wt 245 lb (111.131 kg)  BMI 35.15 kg/m2  SpO2 99%  LMP 02/18/2014 Physical Exam  Nursing note and  vitals reviewed. Constitutional: She appears well-developed and well-nourished. No distress.  HENT:  Head: Normocephalic and atraumatic.  Mouth/Throat: Oropharynx is clear and moist. No oropharyngeal exudate.  Eyes: Conjunctivae are normal.  Neck: Normal range of motion.  Cardiovascular: Normal rate, regular rhythm, normal heart sounds and intact distal pulses.   No murmur heard. Pulmonary/Chest: Effort normal and breath sounds normal. No respiratory distress. She has no wheezes. She exhibits no tenderness.  Abdominal: Soft. Bowel sounds are normal. She exhibits no distension, no abdominal bruit, no ascites, no pulsatile midline mass and no mass. There is no hepatosplenomegaly. There is tenderness (generalized). There is CVA tenderness (left). There is no rebound and no guarding. Hernia confirmed negative in the right inguinal area and confirmed negative in the left inguinal area.  Generalized abd tenderness, worse in the suprapubic region Left CVA tenderness  Genitourinary: Uterus normal. No labial fusion. There is no rash, tenderness or lesion on the right labia. There is no rash, tenderness or lesion on the left labia. Uterus is not deviated, not enlarged, not fixed and not tender. Cervix exhibits no motion tenderness, no discharge and no friability. Right adnexum displays no mass, no tenderness and no fullness. Left adnexum displays no mass, no tenderness and no fullness. No erythema, tenderness or bleeding around the vagina. No foreign body around the vagina. No signs of injury around the vagina. No vaginal discharge found.  Musculoskeletal: Normal range of motion. She exhibits no edema.  Lymphadenopathy:       Right: No inguinal adenopathy present.       Left: No inguinal adenopathy present.  Neurological: She is alert. She exhibits normal muscle tone. Coordination normal.  Skin: Skin is warm and dry. No rash noted. She is not diaphoretic. No erythema.  Psychiatric: She has a normal mood  and affect.    ED Course  Procedures (including critical care time) Labs Review Labs Reviewed  WET PREP, GENITAL - Abnormal; Notable for the following:    Clue Cells Wet Prep HPF POC FEW (*)    All other components within normal limits  CBC WITH DIFFERENTIAL - Abnormal; Notable for the following:    Hemoglobin 9.1 (*)    HCT 29.0 (*)    MCV 70.0 (*)    MCH 22.0 (*)    RDW 21.0 (*)    All other components within normal limits  BASIC METABOLIC PANEL - Abnormal; Notable for the following:    Potassium 3.3 (*)    CO2 18 (*)    Creatinine, Ser 1.11 (*)    GFR calc non Af Amer 63 (*)    GFR calc Af Amer 73 (*)    Anion gap 19 (*)    All other components within normal limits  URINALYSIS, ROUTINE W REFLEX MICROSCOPIC - Abnormal; Notable for the following:    APPearance HAZY (*)    Ketones, ur 40 (*)    Urobilinogen, UA 4.0 (*)    Leukocytes, UA TRACE (*)    All other components within normal limits  URINE MICROSCOPIC-ADD ON - Abnormal; Notable for  the following:    Squamous Epithelial / LPF MANY (*)    All other components within normal limits  I-STAT CG4 LACTIC ACID, ED - Abnormal; Notable for the following:    Lactic Acid, Venous 3.46 (*)    All other components within normal limits  URINE CULTURE  GC/CHLAMYDIA PROBE AMP  CULTURE, BLOOD (ROUTINE X 2)  CULTURE, BLOOD (ROUTINE X 2)  OCCULT BLOOD X 1 CARD TO LAB, STOOL  GI PATHOGEN PANEL BY PCR, STOOL  I-STAT BETA HCG BLOOD, ED ([facility], [facility], [facility] ONLY)  I-STAT TROPOININ, ED  POC OCCULT BLOOD, ED    Imaging Review Dg Chest 2 View  02/25/2014   CLINICAL DATA:  Two-day history of fever and shortness of breath; panic attacks ; vomiting  EXAM: CHEST  2 VIEW  COMPARISON:  PA and lateral chest x-ray of July 27, 2010  FINDINGS: The lungs are adequately inflated. There is no focal infiltrate. There is no pleural effusion or pneumothorax. The cardiac silhouette is normal in size. The pulmonary vascularity is not engorged. The mediastinum is  normal in width. The bony thorax is unremarkable.  IMPRESSION: There is no acute cardiopulmonary abnormality.   Electronically Signed   By: David  Martinique   On: 02/25/2014 13:49   Ct Abdomen Pelvis W Contrast  02/25/2014   CLINICAL DATA:  38 year old female with acute nausea vomiting diarrhea and fever. Bilateral flank pain. Initial encounter.  EXAM: CT ABDOMEN AND PELVIS WITH CONTRAST  TECHNIQUE: Multidetector CT imaging of the abdomen and pelvis was performed using the standard protocol following bolus administration of intravenous contrast.  CONTRAST:  60mL OMNIPAQUE IOHEXOL 300 MG/ML  SOLN  COMPARISON:  Pelvis ultrasound 11/01/2012.  FINDINGS: Trace bilateral pleural effusions. No pericardial effusion, and otherwise negative lung bases.  No acute osseous abnormality identified. Small benign bone islands medial left iliac wing.  Trace free fluid in the cul-de-sac. Distal rectum distended with gas. Decompressed bladder. Uterus and adnexa within normal limits for age.  Negative rectum and sigmoid. Oral contrast has reached the proximal sigmoid. Negative splenic flexure, transverse colon, hepatic flexure, right colon and appendix. Negative terminal ileum. No dilated small bowel. No inflamed small bowel loops. Decompressed stomach and duodenum.  Lobulated hypodense area at the right liver dome measuring 22 x 30 mm. Intermediate densitometry. Liver parenchyma elsewhere within normal limits.  Negative gallbladder; Phrygian cap. Spleen, pancreas and adrenal glands are within normal limits. Suboptimal intravascular contrast bolus. Grossly patent major arterial structures of the abdomen and pelvis, and main portal vein. No abdominal free air or free fluid. Negative left kidney aside from possible punctate intra renal calculi.  Inflammatory stranding surrounding the right kidney (series 2, image 41) without hydronephrosis, but there is stranding about the right renal pelvis and right periureteral stranding. On narrow  windows there is abnormal heterogeneous enhancement of the right renal parenchyma (see coronal images 44 and 56). No right hydroureter or ureteral calculus identified. Small pelvic phleboliths. No lymphadenopathy.  IMPRESSION: 1. Acute right pyelonephritis/lobar nephronia. Perinephric stranding but no renal abscess. 2. No other acute or inflammatory findings identified in the abdomen or pelvis. 3. Nonspecific but probably benign low-density area at the right liver dome. This could be a hemangioma, ultrasound may confirm on an outpatient basis.   Electronically Signed   By: Lars Pinks M.D.   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  He sells with left flank pain and was diagnosed with urinary tract infection a yesterday. Concern for possible nephrolithiasis, UC, Crohn's, colitis, pyelonephritis.  Will give fluids, pain control and obtain CT scan.  BP 104/92  Pulse 77  Temp(Src) 98.3 F (36.8 C) (Oral)  Resp 22  Ht 5\' 10"  (1.778 m)  Wt 245 lb (111.131 kg)  BMI 35.15 kg/m2  SpO2 100%  LMP 02/18/2014  Vitals are stable and there is no evidence of Sirs or sepsis.  Chest x-ray without evidence of pneumonia. No leukocytosis. Patient with elevated lactic acid 3.46.   8:30 PM CT scan of acute right hilar nephritis. Patient with intractable vomiting and persistent vomiting here in the emergency department. Patient will be admitted for dehydration, and antibiotics for her pallor nephritis and her persistent vomiting.  No cervical motion tenderness or cervical motion tenderness to suggest PID.  Fecal occult negative  BP 115/63  Pulse 68  Temp(Src) 98.3 F (36.8 C) (Oral)  Resp 15  Ht 5\' 10"  (1.778 m)  Wt 245 lb (111.131 kg)  BMI 35.15 kg/m2  SpO2 99%  LMP 02/18/2014  His  vitals remained stable and she does not meet Sirs or sepsis criteria.  Patient to be admitted by teaching service to a MedSurg bed.    Jarrett Soho Kyzer Blowe, PA-C 02/25/14 2031

## 2014-02-25 NOTE — H&P (Signed)
Date: 02/25/2014               Patient Name:  Gabriella Snyder MRN: [id number]  DOB: Oct 18, 1975 Age / Sex: 38 y.o., female   PCP: No Pcp Per Patient         Medical Service: Internal Medicine Teaching Service         Attending Physician: Dr. Sid Falcon, MD    First Contact: Dr. Hulen Luster Pager: [phone]  Second Contact: Dr. Heber [place] Pager: [phone]       After Hours (After 5p/  First Contact Pager: [phone]  weekends / holidays): Second Contact Pager: [phone]   Chief Complaint: nausea/vomting, diarrhea  History of Present Illness: Gabriella Snyder is a 38 year old woman with history of anemia presenting with nausea, vomiting, and diarrhea. She reports she had chills on 5 days ago on Thursday. The following day she had subjective fevers, night sweats, poor PO intake, nausea, vomiting, diarrhea, and flank pain. She tried taking alka seltzer cold/flu with minimal relief. She also tried taking ibuprofen for headache. She denies cough, congestion, hematemesis, abdominal pain, hematochezia, dysuria, increased urinary frequency, hematuria, rash, or edema. No recent travel or sick contacts. She lives with her kids who go to school. She reports some intermittent chest pain that felt like weight on her chest for the past couple of days and occasional shortness of breath.   She was seen in the ED yesterday for the same problems. She was given NS bolus, toradol and zofran. Her UA had small leukocytes, negative nitrites, few bacteria and 11-20 WBC. She was diagnosed with UTI and discharged with Keflex 500mg  BID for 7 days and zofran 4mg  Q6hr prn nausea/vomiting.   Meds: Current Facility-Administered Medications  Medication Dose Route Frequency Provider Last Rate Last Dose  . cefTRIAXone (ROCEPHIN) 1 g in dextrose 5 % 50 mL IVPB  1 g Intravenous Once [facility], PA-C 100 mL/hr at 02/25/14 1929 1 g at 02/25/14 1929  . diphenhydrAMINE (BENADRYL) injection 12.5 mg  12.5 mg Intravenous Once [employer], PA-C      . metoCLOPramide (REGLAN) injection 10 mg  10 mg Intravenous Once Abigail Butts, PA-C       Current Outpatient Prescriptions  Medication Sig Dispense Refill  . cephALEXin (KEFLEX) 500 MG capsule Take 1 capsule (500 mg total) by mouth 2 (two) times daily.  14 capsule  0  . Ferrous Sulfate 27 MG TABS Take 27 mg by mouth daily.      . naproxen sodium (ANAPROX) 220 MG tablet Take 220 mg by mouth 2 (two) times daily as needed (for headache).      . ondansetron (ZOFRAN) 4 MG tablet Take 1 tablet (4 mg total) by mouth every 6 (six) hours.  12 tablet  0    Allergies: Allergies as of 02/25/2014  . (No Known Allergies)   Past Medical History  Diagnosis Date  . Anemia   . Anemia   . Anxiety   . Depression    Past Surgical History  Procedure Laterality Date  . Cesarean section    . Dilation and curettage of uterus    . Tubal ligation     Family History  Problem Relation Age of Onset  . Diabetes Mother   . Hypertension Father    History   Social History  . Marital Status: Single    Spouse Name: N/A    Number of Children: N/A  . Years of Education: N/A   Occupational History  . Not  on file.   Social History Main Topics  . Smoking status: Current Every Day Smoker -- 0.50 packs/day    Types: Cigarettes  . Smokeless tobacco: Not on file  . Alcohol Use: Yes  . Drug Use: No  . Sexual Activity: Yes    Birth Control/ Protection: Surgical   Other Topics Concern  . Not on file   Social History Narrative  . No narrative on file    Review of Systems: Constitutional: +fevers/chills Eyes: no vision changes Ears, nose, mouth, throat, and face: no cough Respiratory: +shortness of breath Cardiovascular: +chest pain Gastrointestinal: +nausea/vomiting, no abdominal pain, no constipation, +diarrhea Genitourinary: no dysuria, no hematuria Integument: no rash Hematologic/lymphatic: no bleeding/bruising, no edema Musculoskeletal: no arthralgias, no  myalgias Neurological: no paresthesias, no weakness  Physical Exam: Blood pressure 115/63, pulse 68, temperature 98.3 F (36.8 C), temperature source Oral, resp. rate 15, height 5\' 10"  (1.778 m), weight 245 lb (111.131 kg), last menstrual period 02/18/2014, SpO2 99.00%. General Apperance: NAD Head: Normocephalic, atraumatic Eyes: PERRL, EOMI, anicteric sclera Ears: Normal external ear canal Nose: Nares normal, septum midline, mucosa normal Throat: Lips, mucosa and tongue normal  Neck: Supple, trachea midline Back: No bony abnormality, +CVA tenderness Lungs: Clear to auscultation bilaterally. No wheezes, rhonchi or rales. Breathing comfortably on RA Chest Wall: Nontender, no deformity Heart: Regular rate and rhythm, no murmur/rub/gallop Abdomen: Soft, mild tenderness to palpation bilateral mid lower quadrants, nondistended, no rebound Extremities: Normal, atraumatic, warm and well perfused, no edema Pulses: 2+ throughout Skin: No rashes or lesions Neurologic: Alert and oriented x 3. Normal strength and sensation  Lab results: Basic Metabolic Panel:  Recent Labs  02/24/14 0907 02/25/14 1409  NA 137 138  K 3.5* 3.3*  CL 101 101  CO2 23 18*  GLUCOSE 111* 92  BUN 5* 7  CREATININE 1.08 1.11*  CALCIUM 9.0 9.4   Liver Function Tests:  Recent Labs  02/24/14 0907  AST 10  ALT 5  ALKPHOS 58  BILITOT 0.5  PROT 8.0  ALBUMIN 3.3*    Recent Labs  02/24/14 0907  LIPASE 17    CBC:  Recent Labs  02/24/14 0907 02/25/14 1409  WBC 10.0 10.3  NEUTROABS 7.3 7.3  HGB 8.9* 9.1*  HCT 27.8* 29.0*  MCV 68.1* 70.0*  PLT 193 218   Cardiac Enzymes: Troponin POC 02/25/2014 0.02  Anemia Panel: No results found for this basename: VITAMINB12, FOLATE, FERRITIN, TIBC, IRON, RETICCTPCT,  in the last 72 hours  Urinalysis:  Recent Labs  02/24/14 0955 02/25/14 1708  COLORURINE YELLOW YELLOW  LABSPEC 1.010 1.008  PHURINE 7.0 7.5  GLUCOSEU NEGATIVE NEGATIVE  HGBUR SMALL*  NEGATIVE  BILIRUBINUR NEGATIVE NEGATIVE  KETONESUR NEGATIVE 40*  PROTEINUR 30* NEGATIVE  UROBILINOGEN 4.0* 4.0*  NITRITE NEGATIVE NEGATIVE  LEUKOCYTESUR SMALL* TRACE*   Misc. Labs: Lactic acid 02/25/2014 3.46  Imaging results:  Dg Chest 2 View  02/25/2014   CLINICAL DATA:  Two-day history of fever and shortness of breath; panic attacks ; vomiting  EXAM: CHEST  2 VIEW  COMPARISON:  PA and lateral chest x-ray of July 27, 2010  FINDINGS: The lungs are adequately inflated. There is no focal infiltrate. There is no pleural effusion or pneumothorax. The cardiac silhouette is normal in size. The pulmonary vascularity is not engorged. The mediastinum is normal in width. The bony thorax is unremarkable.  IMPRESSION: There is no acute cardiopulmonary abnormality.   Electronically Signed   By: David  Martinique   On: 02/25/2014 13:49  Ct Abdomen Pelvis W Contrast  02/25/2014   CLINICAL DATA:  38 year old female with acute nausea vomiting diarrhea and fever. Bilateral flank pain. Initial encounter.  EXAM: CT ABDOMEN AND PELVIS WITH CONTRAST  TECHNIQUE: Multidetector CT imaging of the abdomen and pelvis was performed using the standard protocol following bolus administration of intravenous contrast.  CONTRAST:  62mL OMNIPAQUE IOHEXOL 300 MG/ML  SOLN  COMPARISON:  Pelvis ultrasound 11/01/2012.  FINDINGS: Trace bilateral pleural effusions. No pericardial effusion, and otherwise negative lung bases.  No acute osseous abnormality identified. Small benign bone islands medial left iliac wing.  Trace free fluid in the cul-de-sac. Distal rectum distended with gas. Decompressed bladder. Uterus and adnexa within normal limits for age.  Negative rectum and sigmoid. Oral contrast has reached the proximal sigmoid. Negative splenic flexure, transverse colon, hepatic flexure, right colon and appendix. Negative terminal ileum. No dilated small bowel. No inflamed small bowel loops. Decompressed stomach and duodenum.  Lobulated  hypodense area at the right liver dome measuring 22 x 30 mm. Intermediate densitometry. Liver parenchyma elsewhere within normal limits.  Negative gallbladder; Phrygian cap. Spleen, pancreas and adrenal glands are within normal limits. Suboptimal intravascular contrast bolus. Grossly patent major arterial structures of the abdomen and pelvis, and main portal vein. No abdominal free air or free fluid. Negative left kidney aside from possible punctate intra renal calculi.  Inflammatory stranding surrounding the right kidney (series 2, image 41) without hydronephrosis, but there is stranding about the right renal pelvis and right periureteral stranding. On narrow windows there is abnormal heterogeneous enhancement of the right renal parenchyma (see coronal images 44 and 56). No right hydroureter or ureteral calculus identified. Small pelvic phleboliths. No lymphadenopathy.  IMPRESSION: 1. Acute right pyelonephritis/lobar nephronia. Perinephric stranding but no renal abscess. 2. No other acute or inflammatory findings identified in the abdomen or pelvis. 3. Nonspecific but probably benign low-density area at the right liver dome. This could be a hemangioma, ultrasound may confirm on an outpatient basis.   Electronically Signed   By: Lars Pinks M.D.   On: 02/25/2014 18:14    Other results: EKG: Normal sinus rhythm, no T wave inversions or ST changes  Assessment & Plan by Problem: Principal Problem:   Pyelonephritis  Pyelonephritis: UA today with trace leukocytes, negative nitrites, 3-6 WBC, many squamous epithelial cells, rare bacteria. Afebrile, no tachycardia, no leukocytosis - does not meet SIRS criteria. CT abdomen/pelvis with acute right pylenoephritis/lobar nephronia, perinephric stranding but no renal abscess. She received 1g Rocephin in the ED -Follow up urine culture -Follow up blood cultures -morphine 2mg  Q3hr prn severe pain  Chest pain, shortness of breath: differential includes acute coronary  syndrome, PE, aortic dissection, pneumothorax, pericardial effusion, pericarditis/myocarditis, pneumonia, esophageal rupture, GERD, MSK, anxiety. CXR with no acute cardiopulmonary process making pneumothorax, aortic dissection, pericardial effusion, pneumonia, esophageal rupture less likely. Wells score 0 and Geneva score 0 making her low risk for PE. Initial troponin negative and EKG without acute ischemic changes. Her TIMI score is 0. -morphine 2mg  Q3hr prn pain -continue to monitor  N/V/Diarrhea: Differential includes acute gastroenteritis, pylonephritis, GERD -GI pathogen panel pending -zofran 4mg  Q8hr prn, phenergan 25mg  Q4hr prn nausea/vomiting -NS@125ml /hr -pantoprazole 40mg  daily  Abnormal wet prep: Few clue cells seen on wet prep -f/u GC/Chlamydia  Metabolic acidosis with increased anion gap: Lactic acid 3.46. Likely 2/2 hypovolemia from poor PO intake, vomiting, and diarrhea. -BMP and lactic acid in AM -NS@125ml /hr  Anemia: Hgb 9.1, microcytic. Blood smear with polychromasia and elliptocytes. FOBT negative -  Iron studies (Iron, TIBC, Ferritin) -LDH, reticulocyte count  Nonspecific low density area at right liver dome: found on CT. May be hemangioma. -outpatient ultrasound of liver  FEN: -Regular diet -Hypokalemia: replete with 88mEQ of KCl  DVT ppx: Lovenox 40mg  daily  Dispo: Disposition is deferred at this time, awaiting improvement of current medical problems. Anticipated discharge in approximately 2 day(s).   The patient does not have a current PCP (No Pcp Per Patient) and does not need an [facility] hospital follow-up appointment after discharge.  The patient does not have transportation limitations that hinder transportation to clinic appointments.  Signed: Jacques Earthly, MD 02/25/2014, 7:30 PM

## 2014-02-25 NOTE — ED Notes (Signed)
Pt to remain in room til floor obtains a bed for the pt's room; RN on 6E to notify ED when bed is available

## 2014-02-25 NOTE — ED Notes (Addendum)
Pt in c/o vomiting with fevers, diarrhea, pt states she was seen for same yesterday and dx with a UTI, pt in due to worsening symptoms, pt tearful and hyperventilating in triage

## 2014-02-25 NOTE — Progress Notes (Signed)
Attempted report from ED RN. [id number] RN-BC, [place] Number [phone]

## 2014-02-25 NOTE — ED Notes (Signed)
Spoke with patient and she has been sick since Friday with vomiting and diarrhea.  Back today with fever, flank pain, continued vomiting and diarrhea.  Spoke with patient and will check labs per protocol.

## 2014-02-25 NOTE — ED Notes (Signed)
Pt placed on monitor upon return to room from restroom. Pt continues to be monitored by blood pressure, pulse ox, and 12 lead

## 2014-02-25 NOTE — ED Notes (Signed)
Pt remains monitored by blood pressure, pulse ox, and 12 lead. Pts family remains at bedside.  

## 2014-02-25 NOTE — ED Notes (Addendum)
Pt yelling in the lobby stating "I am going to sue you. This is ridiculous and I can not sit in this lobby." Pt swearing at this RN. Security paged. Charge RN Mali G. And Director Theophilus Kinds made aware of situation.

## 2014-02-25 NOTE — ED Notes (Signed)
Results of lactic acid given to the charge nurse [place], patient at triage.

## 2014-02-25 NOTE — Progress Notes (Signed)
New Admission Note:   Arrival Method:  Via stretcher from the ED with EMT Jake Shark Mental Orientation: Alert and oriented X4 Telemetry: No orders Assessment: Completed Skin: Warm, dry and intact IV: Clean dry and intact. NS @ 118mL/hr Pain: Stated she is 7 out of 10 and that morphine does not work for her and wants to get something else  Tubes: N/A Safety Measures: Safety Fall Prevention Plan has been given, discussed and signed Admission: Completed 6 [place] Orientation: Patient has been orientated to the room, unit and staff.  Family: Best friend at the bedside   Orders have been reviewed and implemented. Will continue to monitor the patient. Call light has been placed within reach and bed alarm has been activated.   [employer], RN-BC, RN3 Phone number: [phone] Number [phone]

## 2014-02-26 DIAGNOSIS — D509 Iron deficiency anemia, unspecified: Secondary | ICD-10-CM

## 2014-02-26 DIAGNOSIS — N12 Tubulo-interstitial nephritis, not specified as acute or chronic: Secondary | ICD-10-CM

## 2014-02-26 DIAGNOSIS — R197 Diarrhea, unspecified: Secondary | ICD-10-CM

## 2014-02-26 DIAGNOSIS — R112 Nausea with vomiting, unspecified: Secondary | ICD-10-CM

## 2014-02-26 DIAGNOSIS — N760 Acute vaginitis: Secondary | ICD-10-CM

## 2014-02-26 LAB — COMPREHENSIVE METABOLIC PANEL
ALBUMIN: 2.7 g/dL — AB (ref 3.5–5.2)
AST: 9 U/L (ref 0–37)
Alkaline Phosphatase: 55 U/L (ref 39–117)
Anion gap: 12 (ref 5–15)
BUN: 6 mg/dL (ref 6–23)
CALCIUM: 8.4 mg/dL (ref 8.4–10.5)
CO2: 23 mEq/L (ref 19–32)
Chloride: 106 mEq/L (ref 96–112)
Creatinine, Ser: 0.98 mg/dL (ref 0.50–1.10)
GFR calc Af Amer: 85 mL/min — ABNORMAL LOW (ref >90)
GFR calc non Af Amer: 73 mL/min — ABNORMAL LOW (ref >90)
GLUCOSE: 94 mg/dL (ref 70–99)
POTASSIUM: 3.9 meq/L (ref 3.7–5.3)
SODIUM: 141 meq/L (ref 137–147)
TOTAL PROTEIN: 7.1 g/dL (ref 6.0–8.3)
Total Bilirubin: 0.3 mg/dL (ref 0.3–1.2)

## 2014-02-26 LAB — GC/CHLAMYDIA PROBE AMP
CT Probe RNA: NEGATIVE
GC PROBE AMP APTIMA: NEGATIVE

## 2014-02-26 LAB — URINE CULTURE
COLONY COUNT: NO GROWTH
Culture: NO GROWTH
SPECIAL REQUESTS: NORMAL

## 2014-02-26 LAB — LACTATE DEHYDROGENASE: LDH: 128 U/L (ref 94–250)

## 2014-02-26 LAB — IRON AND TIBC: UIBC: 239 ug/dL (ref 125–400)

## 2014-02-26 LAB — CBC
HEMATOCRIT: 23.7 % — AB (ref 36.0–46.0)
HEMOGLOBIN: 7.4 g/dL — AB (ref 12.0–15.0)
MCH: 22 pg — AB (ref 26.0–34.0)
MCHC: 31.2 g/dL (ref 30.0–36.0)
MCV: 70.3 fL — ABNORMAL LOW (ref 78.0–100.0)
Platelets: 179 10*3/uL (ref 150–400)
RBC: 3.37 MIL/uL — ABNORMAL LOW (ref 3.87–5.11)
RDW: 21.1 % — ABNORMAL HIGH (ref 11.5–15.5)
WBC: 7.7 10*3/uL (ref 4.0–10.5)

## 2014-02-26 LAB — RETICULOCYTES
RBC.: 3.37 MIL/uL — ABNORMAL LOW (ref 3.87–5.11)
Retic Count, Absolute: 27 10*3/uL (ref 19.0–186.0)
Retic Ct Pct: 0.8 % (ref 0.4–3.1)

## 2014-02-26 LAB — FERRITIN: FERRITIN: 64 ng/mL (ref 10–291)

## 2014-02-26 LAB — LACTIC ACID, PLASMA: LACTIC ACID, VENOUS: 0.8 mmol/L (ref 0.5–2.2)

## 2014-02-26 MED ORDER — PANTOPRAZOLE SODIUM 40 MG PO TBEC
40.0000 mg | DELAYED_RELEASE_TABLET | Freq: Every day | ORAL | Status: DC
  Administered 2014-02-26 – 2014-02-27 (×2): 40 mg via ORAL
  Filled 2014-02-26: qty 1

## 2014-02-26 MED ORDER — METRONIDAZOLE 500 MG PO TABS
500.0000 mg | ORAL_TABLET | Freq: Two times a day (BID) | ORAL | Status: DC
  Administered 2014-02-26 – 2014-02-27 (×3): 500 mg via ORAL
  Filled 2014-02-26 (×5): qty 1

## 2014-02-26 MED ORDER — DEXTROSE 5 % IV SOLN
1.0000 g | INTRAVENOUS | Status: DC
  Administered 2014-02-26 – 2014-02-27 (×2): 1 g via INTRAVENOUS
  Filled 2014-02-26 (×2): qty 10

## 2014-02-26 MED ORDER — FERROUS SULFATE 325 (65 FE) MG PO TABS
325.0000 mg | ORAL_TABLET | Freq: Three times a day (TID) | ORAL | Status: DC
  Administered 2014-02-26 – 2014-02-27 (×3): 325 mg via ORAL
  Filled 2014-02-26 (×5): qty 1

## 2014-02-26 MED ORDER — ENSURE COMPLETE PO LIQD
237.0000 mL | Freq: Two times a day (BID) | ORAL | Status: DC
  Administered 2014-02-26 – 2014-02-27 (×2): 237 mL via ORAL

## 2014-02-26 NOTE — Progress Notes (Signed)
Utilization review completed.  

## 2014-02-26 NOTE — H&P (Signed)
  Date: 02/26/2014  Patient name: Gabriella Snyder  Medical record number: [id number]  Date of birth: Jan 16, 1976   I have seen and evaluated Gabriella Snyder and discussed their care with the Residency Team.  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  She does have CVA tenderness on exam.  Blood work revealed a microcytic anemia which will be worked up. She subsequently complained of CP and SOB.    Assessment and Plan: I have seen and evaluated the patient as outlined above. I agree with the formulated Assessment and Plan as detailed in the residents' admission note, with the following changes:   1. Rt pylonephritis - IV antibiotics with rocephin - BC and UC pending - morphine for pain - Believe the majority of her symptoms can be explained by systemic infection  2. CP/SOB - Agree with monitoring.  Her risk for acute chest pathology is low.  If recurs or any new symptoms consider ruling out for ACS or evaluation for PE  3. Anemia, microcytic - Work up with iron panel - Consider outpatient evaluation for thalassemia given history - Consider IV iron if labs show low levels.   Other issues per resident note.   Sid Falcon, MD 10/21/20153:56 PM

## 2014-02-26 NOTE — Progress Notes (Addendum)
INITIAL NUTRITION ASSESSMENT  DOCUMENTATION CODES Per approved criteria  -Obesity Unspecified   INTERVENTION: Provide Ensure Complete po BID, each supplement provides 350 kcal and 13 grams of protein.  Encourage PO intake.  NUTRITION DIAGNOSIS: Inadequate oral intake related to decreased appetite as evidenced by meal completion of 50%.   Goal: Pt to meet >/= 90% of their estimated nutrition needs   Monitor:  PO intake, weight trends, labs, I/O's  Reason for Assessment: MST  38 y.o. female  Admitting Dx: Pyelonephritis  ASSESSMENT: Pt with history of anemia presenting with nausea, vomiting, and diarrhea. She reports she had chills on 5 days ago on Thursday. The following day she had subjective fevers, night sweats, poor PO intake, nausea, vomiting, diarrhea, and flank pain. Pt diagnosed with UTI.   Pt reports not having an appetite. Meal completion is 50%. Pt reports PTA she has not eaten anything since Friday 10/16 as she reports food would not "stay down". Pt reports prior to her illness, she had a great appetite eating 3 full meals a day. Pt reports however she has not lost weight with her usual body weight of around 223 lbs. Pt reports she likes Ensure as she drinks them occassionally at home. Will order. Pt was encouraged to eat her food at meals to help with calorie and protein needs.  Pt with no observed significant fat or muscle mass loss.  Height: Ht Readings from Last 1 Encounters:  02/25/14 5\' 10"  (1.778 m)    Weight: Wt Readings from Last 1 Encounters:  02/25/14 223 lb 6.4 oz (101.334 kg)    Ideal Body Weight: 150 lbs  % Ideal Body Weight: 149%  Wt Readings from Last 10 Encounters:  02/25/14 223 lb 6.4 oz (101.334 kg)  02/24/14 245 lb (111.131 kg)  11/10/13 235 lb (106.595 kg)  11/01/12 242 lb (109.77 kg)  09/26/12 248 lb 8 oz (112.719 kg)  12/31/10 235 lb 8 oz (106.822 kg)    Usual Body Weight: 223 lbs  % Usual Body Weight: 100%  BMI:  Body mass  index is 32.05 kg/(m^2). Class I obesity  Estimated Nutritional Needs: Kcal: 2000-2200 Protein: 105-115 grams Fluid: 2 - 2.2 L/day  Skin: intact  Diet Order: General  EDUCATION NEEDS: -No education needs identified at this time   Intake/Output Summary (Last 24 hours) at 02/26/14 1013 Last data filed at 02/25/14 2154  Gross per 24 hour  Intake   3050 ml  Output      0 ml  Net   3050 ml    Last BM: 10/20  Labs:   Recent Labs Lab 02/24/14 0907 02/25/14 1409 02/26/14 0528  NA 137 138 141  K 3.5* 3.3* 3.9  CL 101 101 106  CO2 23 18* 23  BUN 5* 7 6  CREATININE 1.08 1.11* 0.98  CALCIUM 9.0 9.4 8.4  GLUCOSE 111* 92 94    CBG (last 3)  No results found for this basename: GLUCAP,  in the last 72 hours  Scheduled Meds: . enoxaparin (LOVENOX) injection  50 mg Subcutaneous Q24H  . Influenza vac split quadrivalent PF  0.5 mL Intramuscular Tomorrow-1000  . pantoprazole (PROTONIX) IV  40 mg Intravenous Q24H  . pneumococcal 23 valent vaccine  0.5 mL Intramuscular Tomorrow-1000    Continuous Infusions: . sodium chloride 125 mL/hr at 02/25/14 2311    Past Medical History  Diagnosis Date  . Anxiety   . Depression   . Heart murmur     "when I was younger;  it closed up"  . Anemia   . History of blood transfusion 1993    "related to the C-section  . Migraine     "a couple times/yr" (02/25/2014)  . Pyelonephritis 02/25/2014    Past Surgical History  Procedure Laterality Date  . Cesarean section  1993  . 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 Dilation and curettage of uterus  2003  . Tubal ligation  2011    Kallie Locks, [place], [place], [facility] Pager # [phone] After hours/ weekend pager # [phone]

## 2014-02-26 NOTE — Progress Notes (Signed)
Subjective: Pt endorses night sweats and chills. Pt feels better since admission and wanting to know when she can go home.  Objective: Vital signs in last 24 hours: Filed Vitals:   02/25/14 2158 02/25/14 2320 02/26/14 0515 02/26/14 1028  BP: 120/55  117/63 100/61  Pulse: 80  62 67  Temp: 97.3 F (36.3 C) 98.2 F (36.8 C) 98.1 F (36.7 C) 97.5 F (36.4 C)  TempSrc: Oral Oral Oral Oral  Resp: 16  18 18   Height: 5\' 10"  (1.778 m)     Weight: 223 lb 6.4 oz (101.334 kg)     SpO2: 100%  100% 100%   Weight change:   Intake/Output Summary (Last 24 hours) at 02/26/14 1112 Last data filed at 02/25/14 2154  Gross per 24 hour  Intake   3050 ml  Output      0 ml  Net   3050 ml   General: NAD, pleasant HEENT: EOMI, moist mucous membranes Cardiac: RRR, no murmurs Pulm: CTAB, no wheezes or crackles GI: active bowel sounds, non tender to palpation GU: positive for b/l CVA tenderness, no suprapubic tenderness Ext: no pedal edema  Lab Results: Basic Metabolic Panel:  Recent Labs Lab 02/25/14 1409 02/26/14 0528  NA 138 141  K 3.3* 3.9  CL 101 106  CO2 18* 23  GLUCOSE 92 94  BUN 7 6  CREATININE 1.11* 0.98  CALCIUM 9.4 8.4   Liver Function Tests:  Recent Labs Lab 02/24/14 0907 02/26/14 0528  AST 10 9  ALT 5 <5  ALKPHOS 58 55  BILITOT 0.5 0.3  PROT 8.0 7.1  ALBUMIN 3.3* 2.7*    Recent Labs Lab 02/24/14 0907  LIPASE 17   CBC:  Recent Labs Lab 02/24/14 0907 02/25/14 1409 02/26/14 0528  WBC 10.0 10.3 7.7  NEUTROABS 7.3 7.3  --   HGB 8.9* 9.1* 7.4*  HCT 27.8* 29.0* 23.7*  MCV 68.1* 70.0* 70.3*  PLT 193 218 179   Anemia Panel:  Recent Labs Lab 02/26/14 0528  RETICCTPCT 0.8   Urinalysis:  Recent Labs Lab 02/24/14 0955 02/25/14 1708  COLORURINE YELLOW YELLOW  LABSPEC 1.010 1.008  PHURINE 7.0 7.5  GLUCOSEU NEGATIVE NEGATIVE  HGBUR SMALL* NEGATIVE  BILIRUBINUR NEGATIVE NEGATIVE  KETONESUR NEGATIVE 40*  PROTEINUR 30* NEGATIVE  UROBILINOGEN  4.0* 4.0*  NITRITE NEGATIVE NEGATIVE  LEUKOCYTESUR SMALL* TRACE*   Micro Results: Recent Results (from the past 240 hour(s))  WET PREP, GENITAL     Status: Abnormal   Collection Time    02/25/14  7:02 PM      Result Value Ref Range Status   Yeast Wet Prep HPF POC NONE SEEN  NONE SEEN Final   Trich, Wet Prep NONE SEEN  NONE SEEN Final   Clue Cells Wet Prep HPF POC FEW (*) NONE SEEN Final   WBC, Wet Prep HPF POC NONE SEEN  NONE SEEN Final  GC/CHLAMYDIA PROBE AMP     Status: None   Collection Time    02/25/14  7:02 PM      Result Value Ref Range Status   CT Probe RNA NEGATIVE  NEGATIVE Final   GC Probe RNA NEGATIVE  NEGATIVE Final   Comment: (NOTE)                                                                                               **  Normal Reference Range: Negative**          Assay performed using the Gen-Probe APTIMA COMBO2 (R) Assay.     Acceptable specimen types for this assay include APTIMA Swabs (Unisex,     endocervical, urethral, or vaginal), first void urine, and ThinPrep     liquid based cytology samples.     Performed at [facility]   Studies/Results: Dg Chest 2 View  02/25/2014   CLINICAL DATA:  Two-day history of fever and shortness of breath; panic attacks ; vomiting  EXAM: CHEST  2 VIEW  COMPARISON:  PA and lateral chest x-ray of July 27, 2010  FINDINGS: The lungs are adequately inflated. There is no focal infiltrate. There is no pleural effusion or pneumothorax. The cardiac silhouette is normal in size. The pulmonary vascularity is not engorged. The mediastinum is normal in width. The bony thorax is unremarkable.  IMPRESSION: There is no acute cardiopulmonary abnormality.   Electronically Signed   By: David  Martinique   On: 02/25/2014 13:49   Ct Abdomen Pelvis W Contrast  02/25/2014   CLINICAL DATA:  38 year old female with acute nausea vomiting diarrhea and fever. Bilateral flank pain. Initial encounter.  EXAM: CT ABDOMEN AND PELVIS WITH CONTRAST   TECHNIQUE: Multidetector CT imaging of the abdomen and pelvis was performed using the standard protocol following bolus administration of intravenous contrast.  CONTRAST:  58mL OMNIPAQUE IOHEXOL 300 MG/ML  SOLN  COMPARISON:  Pelvis ultrasound 11/01/2012.  FINDINGS: Trace bilateral pleural effusions. No pericardial effusion, and otherwise negative lung bases.  No acute osseous abnormality identified. Small benign bone islands medial left iliac wing.  Trace free fluid in the cul-de-sac. Distal rectum distended with gas. Decompressed bladder. Uterus and adnexa within normal limits for age.  Negative rectum and sigmoid. Oral contrast has reached the proximal sigmoid. Negative splenic flexure, transverse colon, hepatic flexure, right colon and appendix. Negative terminal ileum. No dilated small bowel. No inflamed small bowel loops. Decompressed stomach and duodenum.  Lobulated hypodense area at the right liver dome measuring 22 x 30 mm. Intermediate densitometry. Liver parenchyma elsewhere within normal limits.  Negative gallbladder; Phrygian cap. Spleen, pancreas and adrenal glands are within normal limits. Suboptimal intravascular contrast bolus. Grossly patent major arterial structures of the abdomen and pelvis, and main portal vein. No abdominal free air or free fluid. Negative left kidney aside from possible punctate intra renal calculi.  Inflammatory stranding surrounding the right kidney (series 2, image 41) without hydronephrosis, but there is stranding about the right renal pelvis and right periureteral stranding. On narrow windows there is abnormal heterogeneous enhancement of the right renal parenchyma (see coronal images 44 and 56). No right hydroureter or ureteral calculus identified. Small pelvic phleboliths. No lymphadenopathy.  IMPRESSION: 1. Acute right pyelonephritis/lobar nephronia. Perinephric stranding but no renal abscess. 2. No other acute or inflammatory findings identified in the abdomen or  pelvis. 3. Nonspecific but probably benign low-density area at the right liver dome. This could be a hemangioma, ultrasound may confirm on an outpatient basis.   Electronically Signed   By: Lars Pinks M.D.   On: 02/25/2014 18:14   Medications: I have reviewed the patient's current medications. Scheduled Meds: . enoxaparin (LOVENOX) injection  50 mg Subcutaneous Q24H  . Influenza vac split quadrivalent PF  0.5 mL Intramuscular Tomorrow-1000  . pantoprazole (PROTONIX) IV  40 mg Intravenous Q24H  . pneumococcal 23 valent vaccine  0.5 mL Intramuscular Tomorrow-1000   Continuous Infusions: . sodium chloride 125 mL/hr at 02/25/14  2311   PRN Meds:.HYDROmorphone (DILAUDID) injection, promethazine Assessment/Plan: Principal Problem:   Pyelonephritis Active Problems:   Acute pyelonephritis  Pyelonephritis: CT abdomen/pelvis with acute right pylenoephritis/lobar nephronia, perinephric stranding but no renal abscess. Last UTI was 20 years ago.   - rocephin IV q24hours, once urine cultures come back and d/c pt home with po abx - urine culture and blood cultures pending - morphine 2mg  Q3hr prn severe pain   N/V/Diarrhea: Differential includes acute gastroenteritis, pylonephritis, GERD  - resolving -GI pathogen panel pending  -zofran 4mg  Q8hr prn, phenergan 25mg  Q4hr prn nausea/vomiting  -NS@125ml /hr  -pantoprazole 40mg  daily   Anemia: Hgb 9.1, now 7.4 microcytic. Blood smear with polychromasia and elliptocytes. FOBT negative. Likely iron deficiency anemia or due to heavy menstrual cycle. Recent drop in hgb to 7.4 from 9.1 likely dilutional 2/2 to IVF administration. -Iron studies (Iron, TIBC, Ferritin) pending - CBC tomorrow to monitor  Abnormal wet prep: Few clue cells seen on wet prep  - GC/Chlamydia negative  - will give 7 day course of flagyl 500mg  BID  Metabolic acidosis with increased anion gap: Lactic acid 3.46. Likely 2/2 hypovolemia from poor PO intake, vomiting, and diarrhea.    -resolved w/ IV hydration -NS@125ml /hr   FEN:  -Regular diet    DVT ppx: Lovenox 40mg  daily   Dispo: Disposition is deferred at this time, awaiting improvement of current medical problems. Anticipated discharge in approximately 2 day(s).   The patient does not have a current PCP (No Pcp Per Patient) and does not need an [facility] hospital follow-up appointment after discharge.   The patient does not have transportation limitations that hinder transportation to clinic appointments.   .Services Needed at time of discharge: Y = Yes, Blank = No PT:   OT:   RN:   Equipment:   Other:     LOS: 1 day   Julious Oka, MD 02/26/2014, 11:12 AM

## 2014-02-27 DIAGNOSIS — N760 Acute vaginitis: Secondary | ICD-10-CM

## 2014-02-27 DIAGNOSIS — D509 Iron deficiency anemia, unspecified: Secondary | ICD-10-CM

## 2014-02-27 DIAGNOSIS — B9689 Other specified bacterial agents as the cause of diseases classified elsewhere: Secondary | ICD-10-CM

## 2014-02-27 LAB — CBC WITH DIFFERENTIAL/PLATELET
BASOS PCT: 1 % (ref 0–1)
Basophils Absolute: 0.1 10*3/uL (ref 0.0–0.1)
EOS ABS: 0.2 10*3/uL (ref 0.0–0.7)
Eosinophils Relative: 3 % (ref 0–5)
HEMATOCRIT: 23.9 % — AB (ref 36.0–46.0)
HEMOGLOBIN: 7.4 g/dL — AB (ref 12.0–15.0)
LYMPHS ABS: 2.5 10*3/uL (ref 0.7–4.0)
Lymphocytes Relative: 34 % (ref 12–46)
MCH: 21 pg — ABNORMAL LOW (ref 26.0–34.0)
MCHC: 31 g/dL (ref 30.0–36.0)
MCV: 67.7 fL — AB (ref 78.0–100.0)
MONO ABS: 0.6 10*3/uL (ref 0.1–1.0)
Monocytes Relative: 8 % (ref 3–12)
NEUTROS ABS: 4 10*3/uL (ref 1.7–7.7)
Neutrophils Relative %: 54 % (ref 43–77)
Platelets: 214 10*3/uL (ref 150–400)
RBC: 3.53 MIL/uL — ABNORMAL LOW (ref 3.87–5.11)
RDW: 21 % — ABNORMAL HIGH (ref 11.5–15.5)
WBC: 7.4 10*3/uL (ref 4.0–10.5)

## 2014-02-27 MED ORDER — CIPROFLOXACIN HCL 500 MG PO TABS: ORAL_TABLET | ORAL | Status: DC

## 2014-02-27 MED ORDER — FERROUS SULFATE 325 (65 FE) MG PO TABS: 325.0000 mg | ORAL_TABLET | Freq: Three times a day (TID) | ORAL | Status: DC

## 2014-02-27 MED ORDER — PROMETHAZINE HCL 12.5 MG PO TABS: 12.5000 mg | ORAL_TABLET | Freq: Four times a day (QID) | ORAL | Status: DC | PRN

## 2014-02-27 MED ORDER — METRONIDAZOLE 500 MG PO TABS
ORAL_TABLET | ORAL | Status: DC
Start: 2014-02-27 — End: 2015-01-09

## 2014-02-27 MED ORDER — OXYCODONE-ACETAMINOPHEN 5-325 MG PO TABS
1.0000 | ORAL_TABLET | Freq: Four times a day (QID) | ORAL | Status: DC | PRN
  Administered 2014-02-27 (×2): 1 via ORAL
  Filled 2014-02-27 (×2): qty 1

## 2014-02-27 MED ORDER — OXYCODONE-ACETAMINOPHEN 5-325 MG PO TABS: 1.0000 | ORAL_TABLET | Freq: Four times a day (QID) | ORAL | Status: DC | PRN

## 2014-02-27 NOTE — Progress Notes (Signed)
CARE MANAGEMENT NOTE 02/27/2014  Patient:  Gabriella Snyder, Gabriella Snyder   Account Number:  [id number]  Date Initiated:  02/27/2014  Documentation initiated by:  [facility]  Subjective/Objective Assessment:   Acute pyelonephritis     Action/Plan:   home   Anticipated DC Date:  02/27/2014   Anticipated DC Plan:  [place]  CM consult      Choice offered to / List presented to:             Status of service:  Completed, signed off Medicare Important Message given?   (If response is "NO", the following Medicare IM given date fields will be blank) Date Medicare IM given:   Medicare IM given by:   Date Additional Medicare IM given:   Additional Medicare IM given by:    Discharge Disposition:  HOME/SELF CARE  Per UR Regulation:    If discussed at Long Length of Stay Meetings, dates discussed:    Comments:  No NCM needs identified. Jonnie Finner RN CCM Case Mgmt phone [phone]

## 2014-02-27 NOTE — Progress Notes (Signed)
Subjective: Pt endorses nausea, night sweats and chills. Pt still requiring IV pain meds. She is tolerating a full diet. Reporting rt lower back pain > left.   Objective: Vital signs in last 24 hours: Filed Vitals:   02/26/14 1807 02/26/14 2124 02/27/14 0545 02/27/14 1100  BP: 117/67 123/58 118/57 117/67  Pulse: 87 87 61 66  Temp: 98.6 F (37 C) 97.8 F (36.6 C) 98.4 F (36.9 C) 97.6 F (36.4 C)  TempSrc: Oral Oral Oral Oral  Resp: 17 16 18 18   Height:  5\' 10"  (1.778 m)    Weight:  225 lb 10 oz (102.343 kg)    SpO2: 100% 100% 100% 100%   Weight change: -19 lb 6 oz (-8.788 kg)  Intake/Output Summary (Last 24 hours) at 02/27/14 1121 Last data filed at 02/26/14 1303  Gross per 24 hour  Intake    120 ml  Output      0 ml  Net    120 ml   General: NAD, pleasant HEENT: EOMI, moist mucous membranes Cardiac: RRR, no murmurs Pulm: CTAB, no wheezes or crackles GI: active bowel sounds, non tender to palpation GU: positive for b/l CVA tenderness Ext: no pedal edema  Lab Results: Basic Metabolic Panel:  Recent Labs Lab 02/25/14 1409 02/26/14 0528  NA 138 141  K 3.3* 3.9  CL 101 106  CO2 18* 23  GLUCOSE 92 94  BUN 7 6  CREATININE 1.11* 0.98  CALCIUM 9.4 8.4   Liver Function Tests:  Recent Labs Lab 02/24/14 0907 02/26/14 0528  AST 10 9  ALT 5 <5  ALKPHOS 58 55  BILITOT 0.5 0.3  PROT 8.0 7.1  ALBUMIN 3.3* 2.7*    Recent Labs Lab 02/24/14 0907  LIPASE 17   CBC:  Recent Labs Lab 02/25/14 1409 02/26/14 0528 02/27/14 0735  WBC 10.3 7.7 7.4  NEUTROABS 7.3  --  4.0  HGB 9.1* 7.4* 7.4*  HCT 29.0* 23.7* 23.9*  MCV 70.0* 70.3* 67.7*  PLT 218 179 214   Anemia Panel:  Recent Labs Lab 02/26/14 0528  FERRITIN 64  TIBC Not calculated due to Iron <10.  IRON <10*  RETICCTPCT 0.8   Urinalysis:  Recent Labs Lab 02/24/14 0955 02/25/14 1708  COLORURINE YELLOW YELLOW  LABSPEC 1.010 1.008  PHURINE 7.0 7.5  GLUCOSEU NEGATIVE NEGATIVE  HGBUR  SMALL* NEGATIVE  BILIRUBINUR NEGATIVE NEGATIVE  KETONESUR NEGATIVE 40*  PROTEINUR 30* NEGATIVE  UROBILINOGEN 4.0* 4.0*  NITRITE NEGATIVE NEGATIVE  LEUKOCYTESUR SMALL* TRACE*    Studies/Results: Dg Chest 2 View  02/25/2014   CLINICAL DATA:  Two-day history of fever and shortness of breath; panic attacks ; vomiting  EXAM: CHEST  2 VIEW  COMPARISON:  PA and lateral chest x-ray of July 27, 2010  FINDINGS: The lungs are adequately inflated. There is no focal infiltrate. There is no pleural effusion or pneumothorax. The cardiac silhouette is normal in size. The pulmonary vascularity is not engorged. The mediastinum is normal in width. The bony thorax is unremarkable.  IMPRESSION: There is no acute cardiopulmonary abnormality.   Electronically Signed   By: David  Martinique   On: 02/25/2014 13:49   Ct Abdomen Pelvis W Contrast  02/25/2014   CLINICAL DATA:  38 year old female with acute nausea vomiting diarrhea and fever. Bilateral flank pain. Initial encounter.  EXAM: CT ABDOMEN AND PELVIS WITH CONTRAST  TECHNIQUE: Multidetector CT imaging of the abdomen and pelvis was performed using the standard protocol following bolus administration of intravenous contrast.  CONTRAST:  59mL OMNIPAQUE IOHEXOL 300 MG/ML  SOLN  COMPARISON:  Pelvis ultrasound 11/01/2012.  FINDINGS: Trace bilateral pleural effusions. No pericardial effusion, and otherwise negative lung bases.  No acute osseous abnormality identified. Small benign bone islands medial left iliac wing.  Trace free fluid in the cul-de-sac. Distal rectum distended with gas. Decompressed bladder. Uterus and adnexa within normal limits for age.  Negative rectum and sigmoid. Oral contrast has reached the proximal sigmoid. Negative splenic flexure, transverse colon, hepatic flexure, right colon and appendix. Negative terminal ileum. No dilated small bowel. No inflamed small bowel loops. Decompressed stomach and duodenum.  Lobulated hypodense area at the right liver dome  measuring 22 x 30 mm. Intermediate densitometry. Liver parenchyma elsewhere within normal limits.  Negative gallbladder; Phrygian cap. Spleen, pancreas and adrenal glands are within normal limits. Suboptimal intravascular contrast bolus. Grossly patent major arterial structures of the abdomen and pelvis, and main portal vein. No abdominal free air or free fluid. Negative left kidney aside from possible punctate intra renal calculi.  Inflammatory stranding surrounding the right kidney (series 2, image 41) without hydronephrosis, but there is stranding about the right renal pelvis and right periureteral stranding. On narrow windows there is abnormal heterogeneous enhancement of the right renal parenchyma (see coronal images 44 and 56). No right hydroureter or ureteral calculus identified. Small pelvic phleboliths. No lymphadenopathy.  IMPRESSION: 1. Acute right pyelonephritis/lobar nephronia. Perinephric stranding but no renal abscess. 2. No other acute or inflammatory findings identified in the abdomen or pelvis. 3. Nonspecific but probably benign low-density area at the right liver dome. This could be a hemangioma, ultrasound may confirm on an outpatient basis.   Electronically Signed   By: Lars Pinks M.D.   On: 02/25/2014 18:14   Medications: I have reviewed the patient's current medications. Scheduled Meds: . cefTRIAXone (ROCEPHIN)  IV  1 g Intravenous Q24H  . enoxaparin (LOVENOX) injection  50 mg Subcutaneous Q24H  . feeding supplement (ENSURE COMPLETE)  237 mL Oral BID BM  . ferrous sulfate  325 mg Oral TID WC  . Influenza vac split quadrivalent PF  0.5 mL Intramuscular Tomorrow-1000  . metroNIDAZOLE  500 mg Oral BID  . pantoprazole  40 mg Oral Daily  . pneumococcal 23 valent vaccine  0.5 mL Intramuscular Tomorrow-1000   Continuous Infusions:   PRN Meds:.HYDROmorphone (DILAUDID) injection, promethazine Assessment/Plan: Principal Problem:   Pyelonephritis Active Problems:   Acute  pyelonephritis  Pyelonephritis: CT abdomen/pelvis with acute right pylenoephritis/lobar nephronia, perinephric stranding but no renal abscess. Last UTI was 20 years ago.   - day 3 of rocephin, will get today's dose at 11:45am. After that will switch to cipro 500mg  BID x 5 days starting tomorrow - urine culture negative - blood cultures NGTD - morphine d/c'd switched to percocet q6hs prn for pain, will d/c pt home with this as well  N/V/Diarrhea: Differential includes acute gastroenteritis, pylonephritis, GERD  - resolving, pt has not had a bowel movement since admission  -zofran 4mg  Q8hr prn, phenergan 25mg  Q4hr prn nausea/vomiting, will d/c pt home w/ phenergan po -pantoprazole 40mg  daily   Iron deficiency anemia: Hgb 9.1, now 7.4 microcytic. FOBT negative.  - pt asymptomatic, started on ferrous sulfate TID   Abnormal wet prep: Few clue cells seen on wet prep  - day 2/7 of flagyl 500mg  BID  FEN:  -Regular diet    DVT ppx: Lovenox 40mg  daily   Dispo: d/c home today once tolerating PO meds  The patient does not have a  current PCP (No Pcp Per Patient) and does not need an [facility] hospital follow-up appointment after discharge.   The patient does not have transportation limitations that hinder transportation to clinic appointments.    LOS: 2 days   Julious Oka, MD 02/27/2014, 11:21 AM

## 2014-02-27 NOTE — Discharge Summary (Signed)
Name: Gabriella Snyder MRN: [id number] DOB: [birth year] 38 y.o. PCP: No Pcp Per Patient  Date of Admission: 02/25/2014  2:37 PM Date of Discharge: 02/27/2014 Attending Physician: Sid Falcon, MD  Discharge Diagnosis: Principal Problem:   Acute pyelonephritis Active Problems:   BV (bacterial vaginosis)   Iron deficiency anemia  Discharge Medications:   Medication List    STOP taking these medications       cephALEXin 500 MG capsule  Commonly known as:  KEFLEX      TAKE these medications       ciprofloxacin 500 MG tablet  Commonly known as:  CIPRO  Take 1 tab twice a day for 5 days     ferrous sulfate 325 (65 FE) MG tablet  Take 1 tablet (325 mg total) by mouth 3 (three) times daily with meals.     metroNIDAZOLE 500 MG tablet  Commonly known as:  FLAGYL  On day of discharge take one tab at night, then start taking 1 tab twice a day for 5 more days.     naproxen sodium 220 MG tablet  Commonly known as:  ANAPROX  Take 220 mg by mouth 2 (two) times daily as needed (for headache).     ondansetron 4 MG tablet  Commonly known as:  ZOFRAN  Take 1 tablet (4 mg total) by mouth every 6 (six) hours.     oxyCODONE-acetaminophen 5-325 MG per tablet  Commonly known as:  PERCOCET/ROXICET  Take 1-2 tablets by mouth every 6 (six) hours as needed for moderate pain.     promethazine 12.5 MG tablet  Commonly known as:  PHENERGAN  Take 1 tablet (12.5 mg total) by mouth every 6 (six) hours as needed for nausea or vomiting.        Disposition and follow-up:   Gabriella Snyder was discharged from [facility] in [place] [facility] condition.  At the hospital follow up visit please address:  1.  Please ensure patient complete her abx course of flagyl and cipro  2.  Labs / imaging needed at time of follow-up: CBC to check hgb  3.  Pending labs/ test needing follow-up: blood cutlures x 2  4. Nonspecific low density area at right liver dome found on CT. May be  hemangioma, would consider outpatient ultrasound of liver.    Procedures Performed:  Dg Chest 2 View  02/25/2014   CLINICAL DATA:  Two-day history of fever and shortness of breath; panic attacks ; vomiting  EXAM: CHEST  2 VIEW  COMPARISON:  PA and lateral chest x-ray of July 27, 2010  FINDINGS: The lungs are adequately inflated. There is no focal infiltrate. There is no pleural effusion or pneumothorax. The cardiac silhouette is normal in size. The pulmonary vascularity is not engorged. The mediastinum is normal in width. The bony thorax is unremarkable.  IMPRESSION: There is no acute cardiopulmonary abnormality.   Electronically Signed   By: David  Martinique   On: 02/25/2014 13:49   Ct Abdomen Pelvis W Contrast  02/25/2014   CLINICAL DATA:  38 year old female with acute nausea vomiting diarrhea and fever. Bilateral flank pain. Initial encounter.  EXAM: CT ABDOMEN AND PELVIS WITH CONTRAST  TECHNIQUE: Multidetector CT imaging of the abdomen and pelvis was performed using the standard protocol following bolus administration of intravenous contrast.  CONTRAST:  53mL OMNIPAQUE IOHEXOL 300 MG/ML  SOLN  COMPARISON:  Pelvis ultrasound 11/01/2012.  FINDINGS: Trace bilateral pleural effusions. No pericardial effusion, and otherwise negative lung bases.  No acute osseous abnormality identified. Small benign bone islands medial left iliac wing.  Trace free fluid in the cul-de-sac. Distal rectum distended with gas. Decompressed bladder. Uterus and adnexa within normal limits for age.  Negative rectum and sigmoid. Oral contrast has reached the proximal sigmoid. Negative splenic flexure, transverse colon, hepatic flexure, right colon and appendix. Negative terminal ileum. No dilated small bowel. No inflamed small bowel loops. Decompressed stomach and duodenum.  Lobulated hypodense area at the right liver dome measuring 22 x 30 mm. Intermediate densitometry. Liver parenchyma elsewhere within normal limits.  Negative  gallbladder; Phrygian cap. Spleen, pancreas and adrenal glands are within normal limits. Suboptimal intravascular contrast bolus. Grossly patent major arterial structures of the abdomen and pelvis, and main portal vein. No abdominal free air or free fluid. Negative left kidney aside from possible punctate intra renal calculi.  Inflammatory stranding surrounding the right kidney (series 2, image 41) without hydronephrosis, but there is stranding about the right renal pelvis and right periureteral stranding. On narrow windows there is abnormal heterogeneous enhancement of the right renal parenchyma (see coronal images 44 and 56). No right hydroureter or ureteral calculus identified. Small pelvic phleboliths. No lymphadenopathy.  IMPRESSION: 1. Acute right pyelonephritis/lobar nephronia. Perinephric stranding but no renal abscess. 2. No other acute or inflammatory findings identified in the abdomen or pelvis. 3. Nonspecific but probably benign low-density area at the right liver dome. This could be a hemangioma, ultrasound may confirm on an outpatient basis.   Electronically Signed   By: Lars Pinks M.D.   On: 02/25/2014 18:14    Admission HPI: Gabriella Snyder is a 38 year old woman with history of anemia presenting with nausea, vomiting, and diarrhea. She reports she had chills on 5 days ago on Thursday. The following day she had subjective fevers, night sweats, poor PO intake, nausea, vomiting, diarrhea, and flank pain. She tried taking alka seltzer cold/flu with minimal relief. She also tried taking ibuprofen for headache. She denies cough, congestion, hematemesis, abdominal pain, hematochezia, dysuria, increased urinary frequency, hematuria, rash, or edema. No recent travel or sick contacts. She lives with her kids who go to school. She reports some intermittent chest pain that felt like weight on her chest for the past couple of days and occasional shortness of breath.  She was seen in the ED yesterday for the same  problems. She was given NS bolus, toradol and zofran. Her UA had small leukocytes, negative nitrites, few bacteria and 11-20 WBC. She was diagnosed with UTI and discharged with Keflex 500mg  BID for 7 days and zofran 4mg  Q6hr prn nausea/vomiting.    Hospital Course by problem list: Principal Problem:   Acute pyelonephritis Active Problems:   BV (bacterial vaginosis)   Iron deficiency anemia   Pyelonephritis: UA with trace leukocytes, negative nitrites, 3-6 WBC, many squamous epithelial cells, rare bacteria. Pt was afebrile and negative for leukocytosis during hospital course. She had b/l CVA tenderness. CT abdomen/pelvis revealed acute right pylenoephritis/lobar nephronia, perinephric stranding but no renal abscess. She received 1g Rocephin in the ED and was continued on IV rocephin for a total of 3 days. She was given a rx for cipro 500mg  BID x 5 days. Urine culture negative. Pt did not have any bowel movements during admission. Her nausea was controlled w/ phenergan.    Bacterial vaginosis: Few clue cells seen on wet prep. GC/Chlamydia negative. Started on flagyl 500mg  BID x 7 days.   Anemia: Hgb 9.1, microcytic. Blood smear with polychromasia and elliptocytes. FOBT negative. Hbg  dropped to 7.4 secondary to IV hydration. Iron studies revealed significant iron deficiency and patient was started on ferrous sulfate 325 mg TID which was continued on discharge.     Discharge Vitals:   BP 117/67  Pulse 66  Temp(Src) 97.6 F (36.4 C) (Oral)  Resp 18  Ht 5\' 10"  (1.778 m)  Wt 225 lb 10 oz (102.343 kg)  BMI 32.37 kg/m2  SpO2 100%  LMP 02/18/2014  Discharge Labs:  Results for orders placed during the hospital encounter of 02/25/14 (from the past 24 hour(s))  CBC WITH DIFFERENTIAL     Status: Abnormal   Collection Time    02/27/14  7:35 AM      Result Value Ref Range   WBC 7.4  4.0 - 10.5 K/uL   RBC 3.53 (*) 3.87 - 5.11 MIL/uL   Hemoglobin 7.4 (*) 12.0 - 15.0 g/dL   HCT 23.9 (*) 36.0 -  46.0 %   MCV 67.7 (*) 78.0 - 100.0 fL   MCH 21.0 (*) 26.0 - 34.0 pg   MCHC 31.0  30.0 - 36.0 g/dL   RDW 21.0 (*) 11.5 - 15.5 %   Platelets 214  150 - 400 K/uL   Neutrophils Relative % 54  43 - 77 %   Lymphocytes Relative 34  12 - 46 %   Monocytes Relative 8  3 - 12 %   Eosinophils Relative 3  0 - 5 %   Basophils Relative 1  0 - 1 %   Neutro Abs 4.0  1.7 - 7.7 K/uL   Lymphs Abs 2.5  0.7 - 4.0 K/uL   Monocytes Absolute 0.6  0.1 - 1.0 K/uL   Eosinophils Absolute 0.2  0.0 - 0.7 K/uL   Basophils Absolute 0.1  0.0 - 0.1 K/uL   RBC Morphology POLYCHROMASIA PRESENT     WBC Morphology ATYPICAL LYMPHOCYTES      Signed: Julious Oka, MD 02/27/2014, 1:32 PM    Services Ordered on Discharge: none Equipment Ordered on Discharge: none

## 2014-02-27 NOTE — Progress Notes (Signed)
Pt discharge instructions given, pt verbalized understanding.  VSS. Denies pain. Pleasant. Prescription given.  Pt waiting on transportation home.

## 2014-02-27 NOTE — Progress Notes (Signed)
  Date: 02/27/2014  Patient name: Gabriella Snyder  Medical record number: [id number]  Date of birth: 01-31-1976   This patient has been seen and the plan of care was discussed with the house staff. Please see their note for complete details. I concur with their findings with the following additions/corrections:  If patient is tolerating PO, she can be discharged today with Cipro 500mg  BID for a 10 day total course along with nausea and pain medications.   Sid Falcon, MD 02/27/2014, 4:18 PM

## 2014-02-27 NOTE — Discharge Instructions (Signed)
Start taking your medications as directed. Go to the ED if you develop a fever or worsening in your lower back pain.    Emergency Department Resource Guide 1) Find a Doctor and Pay Out of Pocket Although you won't have to find out who is covered by your insurance plan, it is a good idea to ask around and get recommendations. You will then need to call the office and see if the doctor you have chosen will accept you as a new patient and what types of options they offer for patients who are self-pay. Some doctors offer discounts or will set up payment plans for their patients who do not have insurance, but you will need to ask so you aren't surprised when you get to your appointment.  2) Contact Your Local Health Department Not all health departments have doctors that can see patients for sick visits, but many do, so it is worth a call to see if yours does. If you don't know where your local health department is, you can check in your phone book. The CDC also has a tool to help you locate your state's health department, and many state websites also have listings of all of their local health departments.  3) Find a [facility] If your illness is not likely to be very severe or complicated, you may want to try a walk in clinic. These are popping up all over the country in pharmacies, drugstores, and shopping centers. They're usually staffed by nurse practitioners or physician assistants that have been trained to treat common illnesses and complaints. They're usually fairly quick and inexpensive. However, if you have serious medical issues or chronic medical problems, these are probably not your best option.  No Primary Care Doctor: - Call Health Connect at  [phone] - they can help you locate a primary care doctor that  accepts your insurance, provides certain services, etc. - Physician Referral Service- [phone]  Chronic Pain Problems: Organization         Address  Phone   Notes  [facility]  [phone] Patients need to be referred by their primary care doctor.   Medication Assistance: Organization         Address  Phone   Notes  [facility] Medication [facility] [address] [place], [place] [address] [phone] --Must be a resident of [facility] -- Must have NO insurance coverage whatsoever (no Medicaid/ Medicare, etc.) -- The pt. MUST have a primary care doctor that directs their care regularly and follows them in the community   MedAssist  [phone]   [employer]  [phone]    Agencies that provide inexpensive medical care: Organization         Address  Phone   Notes  [place]  [phone]   [facility]    [phone]   [facility] [place], [place] [address] [phone]   [place] [address], [place] [phone]   Planned Parenthood    [phone]   [facility]    [phone]   [place] and [address], [place] Phone:  [phone], Fax:  [phone] Hours of Operation:  9 am - 6 pm, M-F.  Also accepts Medicaid/Medicare and self-pay.  [facility] for [place] [address], [employer] Phone: [phone])  [phone], Fax: [phone]. Hours of Operation:  8:30 am - 5:30 pm, M-F.  Also accepts Medicaid and self-pay.  [facility] [address], [place] Phone: [phone]   [place], [place], [place] [phone], Ext. [phone] Mondays & Thursdays: 7-9 AM.  First 15 patients are seen on a first come, first serve basis.    [place] Providers:  Organization         Address  Phone   Notes  [facility] [address], [place] [phone] Also accepts self-pay patients.  [facility] [address], [place]  [phone]   [place], [address], [place] [phone]   [facility] [address] [place] [phone]   Lucianne Lei [address], [place]   [phone] Only accepts [place] Access [place] patients after they have their name applied to their card.   Self-Pay (no insurance) in [facility]:  Organization         Address  Phone   Notes  Sickle Cell Patients, [facility] [place] [phone]   [facility] [place] [phone]   [facility] [place]  [place], [place],  [phone]   [facility]/Dr. Osei-Bonsu  [address], [place] or [address], [place] [phone] Phone number for both [place] and [place] locations is the same.  Urgent Medical and [facility] [address] [place] [phone]   [facility] [address], [place] or [address] [phone]   [facility] [address] [place] [phone], phone; [phone], fax Sees patients 1st and 3rd Saturday of every month.  Must not qualify for public or private insurance (i.e. Medicaid, Medicare, [place] Health Choice, Veterans' Benefits)  Household income should be no more than 200% of the poverty level The clinic cannot treat you if you are pregnant or think you are pregnant  Sexually transmitted diseases are not treated at the clinic.    Dental Care: Organization         Address  Phone  Notes  [facility] Department of [facility] [place] [phone] Accepts children up to age 61 who are enrolled in [place] or [place]; pregnant women with a Medicaid card; and children who have applied for Medicaid  or [place] Health Choice, but were declined, whose parents can pay a reduced fee at time of service.  [facility] Department of [facility]  [address], [place] [phone] Accepts children up to age 65 who are enrolled in [place] or [place]; pregnant women with a Medicaid card; and children who have applied for Medicaid or [place] Health Choice, but were declined, whose parents can pay a reduced fee at time of service.  [place] [facility] Access PRO[facility]  [place] [phone] Patients are seen by appointment only. Walk-ins are not accepted. [place] will see patients 81 years of age and older. Monday - Tuesday (8am-5pm) Most Wednesdays (8:30-5pm) $30 per visit, cash only  [place] [facility] Access [facility]  [address], [employer]  [place] [phone] Patients are seen by appointment only. Walk-ins are not accepted. [place] will see patients 62 years of age and older. One Wednesday Evening (Monthly: Volunteer Based).  $30 per visit, cash only  [place]  [phone] for adults; Children under age 70, call Graduate Pediatric Dentistry at [phone]. Children aged 36-14, please call [phone] to request a pediatric application.  Dental services are provided in all areas of dental care including fillings, crowns and bridges, complete and partial dentures, implants, gum treatment, root canals, and extractions. Preventive care is also provided. Treatment is provided to both adults and children. Patients are selected via a lottery and there is often a waiting list.   [facility] [address] [place]  [phone] [email or web address]   [facility] [address] [place], [place] [phone], Ext. [phone] Second and Fourth Thursday of each month, opens at 6:30 AM; Clinic ends at 9 AM.  Patients are seen on a first-come first-served basis, and a limited number are seen  during each clinic.   [facility]  [address] [place], [place] [phone]   Eligibility Requirements You must have lived in [place], [place], or [place] counties for at least the last three months.   You cannot be eligible for state or federal sponsored [employer], including [employer], [place], or [employer].   You generally cannot be eligible for healthcare insurance through your employer.    How to apply: Eligibility screenings are held every Tuesday and Wednesday afternoon from 1:00 pm until 4:00 pm. You do not need an appointment for the interview!  [facility] [address] [place], [place]   [place]  [place] Department  [place]  [phone]    Behavioral Health Resources in the Community: Intensive Outpatient Programs Organization         Address  Phone  Notes  [place]. [address], [place], [place] [phone]   [facility] [address] [place], [place]   ADS: Alcohol & Drug Svcs [address], [place], [place]   [place] [address],  [place], [place] or [phone]   Substance Abuse Resources Organization         Address  Phone  Notes  Alcohol and Drug Services  [phone]   [place]  [phone]   [facility] [place]   Chinita Pester  [phone]   Residential & Outpatient Substance Abuse Program  [phone]   Psychological Services Organization         Address  Phone  Notes  [facility] [place]  [place]  [phone]   [place] [address], [place] or [phone]    Mobile Crisis Teams Organization         Address  Phone  Notes  [facility],  Mobile Crisis Care Unit  [phone]   [facility]ive Psychotherapeutic Services  [address] [place], [place]   [place] Levels [address] [place] [phone]    Self-Help/Support Groups Organization         Address  Phone             Notes  [employer] of [place] - variety of support groups  336- H3156881 Call for more information  Narcotics Anonymous (NA), [facility] [address], [employer] [place]  2 meetings at this location   Residential Facilities manager         Address  Phone  Notes  [facility] [place],    [place]  [phone]   [facility]  [address], [place] [phone], [place], [place]   [place], [place] [phone] Admissions: 8am-3pm M-F  Incentives Substance [place] [address]    [place], [place] [phone]   [facility] [address] [place], [place], [place]   [facility] [address],  [employer], [place]   [facility] Programs - Intensive Outpatient [address], [place], [place]   [facility].,  [place], [place] [phone] or [phone]   [facility] Services (RTS) [address] [place], [place] Accepts Medicaid  [facility] [address],  [place] [phone] Substance Abuse/Addiction Treatment   [facility] Organization         Address  Phone  Notes  [facility]  [phone]   Domenic Schwab, PhD [address] [place], [place]   [phone] or [phone]   [place], [place] [phone]   [facility] [address], [place], [place] [phone] Insurance/Medicaid/sponsorship through [facility] and Families [address],  [address] [place]                                    [place], [place] [phone] [place] [address], [place] [phone]    Dr. Adele Schilder  [phone]   [facility] Dept. 1) [address], [place] 2) [place] 3)  [address], [place] [phone]  [phone]   [place] [phone] or [phone] (After Hours)

## 2014-02-28 NOTE — ED Provider Notes (Signed)
Medical screening examination/treatment/procedure(s) were performed by non-physician practitioner and as supervising physician I was immediately available for consultation/collaboration.   EKG Interpretation   Date/Time:  Tuesday February 25 2014 13:13:52 EDT Ventricular Rate:  91 PR Interval:  124 QRS Duration: 92 QT Interval:  358 QTC Calculation: 440 R Axis:   96 Text Interpretation:  Normal sinus rhythm Rightward axis Cannot rule out  Anterior infarct , age undetermined Abnormal ECG ED PHYSICIAN  INTERPRETATION AVAILABLE IN [place] Confirmed by TEST, Record  ([id number]) on 02/27/2014 7:34:35 AM       Leota Jacobsen, MD 02/28/14 1150

## 2014-02-28 NOTE — ED Provider Notes (Signed)
Medical screening examination/treatment/procedure(s) were performed by non-physician practitioner and as supervising physician I was immediately available for consultation/collaboration.   EKG Interpretation None        Francine Graven, DO 02/28/14 1529

## 2014-03-02 NOTE — Discharge Summary (Signed)
I saw Gabriella Snyder on day of discharge and assisted in the discharge planning.

## 2014-03-04 LAB — CULTURE, BLOOD (ROUTINE X 2)
CULTURE: NO GROWTH
Culture: NO GROWTH

## 2014-03-10 ENCOUNTER — Encounter (HOSPITAL_COMMUNITY): Payer: Self-pay | Admitting: Emergency Medicine

## 2014-07-05 ENCOUNTER — Emergency Department (HOSPITAL_COMMUNITY)
Admission: EM | Admit: 2014-07-05 | Discharge: 2014-07-05 | Disposition: A | Discharge status: Home / Self Care | Payer: Medicaid Other | Source: Home / Self Care | Attending: Emergency Medicine | Admitting: Emergency Medicine

## 2014-07-05 ENCOUNTER — Encounter (HOSPITAL_COMMUNITY): Payer: Self-pay | Admitting: Emergency Medicine

## 2014-07-05 DIAGNOSIS — M62838 Other muscle spasm: Secondary | ICD-10-CM

## 2014-07-05 LAB — POCT I-STAT, CHEM 8
BUN: 10 mg/dL (ref 6–23)
CALCIUM ION: 1.19 mmol/L (ref 1.12–1.23)
CHLORIDE: 103 mmol/L (ref 96–112)
Creatinine, Ser: 0.9 mg/dL (ref 0.50–1.10)
GLUCOSE: 90 mg/dL (ref 70–99)
HCT: 42 % (ref 36.0–46.0)
Hemoglobin: 14.3 g/dL (ref 12.0–15.0)
Potassium: 3.9 mmol/L (ref 3.5–5.1)
SODIUM: 140 mmol/L (ref 135–145)
TCO2: 22 mmol/L (ref 0–100)

## 2014-07-05 MED ORDER — CYCLOBENZAPRINE HCL 10 MG PO TABS: 10.0000 mg | ORAL_TABLET | Freq: Three times a day (TID) | ORAL | Status: DC | PRN

## 2014-07-05 NOTE — ED Provider Notes (Signed)
CSN: [id number]     Arrival date & time 07/05/14  1343 History   First MD Initiated Contact with Patient 07/05/14 1454     Chief Complaint  Patient presents with  . Spasms   (Consider location/radiation/quality/duration/timing/severity/associated sxs/prior Treatment) HPI  She is a 39 year old woman here for evaluation of muscle spasms. She states she has a long history of intermittent muscle spasms from the time she was 18. She reports back and neck spasms that started about a week and a half ago. Then last night she had severe spasms in her back, legs, and toes. She tried taking some Robaxin which did not help. She denies any specific injury or trauma. No specific numbness, tingling, weakness.  Past Medical History  Diagnosis Date  . Anxiety   . Depression   . Heart murmur     "when I was younger; it closed up"  . Anemia   . History of blood transfusion 1993    "related to the C-section  . Migraine     "a couple times/yr" (02/25/2014)  . Pyelonephritis 02/25/2014   Past Surgical History  Procedure Laterality Date  . Cesarean section  1993  . 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 Dilation and curettage of uterus  2003  . Tubal ligation  2011   Family History  Problem Relation Age of Onset  . Diabetes Mother   . Hypertension Father    History  Substance Use Topics  . Smoking status: Current Every Day Smoker -- 0.50 packs/day for 6 years    Types: Cigarettes  . Smokeless tobacco: Never Used  . Alcohol Use: Yes     Comment: 02/25/2014 "don't really drink; might have a drink q now and then"   OB History    Gravida Para Term Preterm AB TAB SAB Ectopic Multiple Living   8 5 5  0 2 0 1 0 0 5     Review of Systems As in history of present illness Allergies  Review of patient's allergies indicates no known allergies.  Home Medications   Prior to Admission medications   Medication Sig Start Date End Date Taking? Authorizing Provider  ciprofloxacin (CIPRO) 500  MG tablet Take 1 tab twice a day for 7 days 02/27/14   Sid Falcon, MD  cyclobenzaprine (FLEXERIL) 10 MG tablet Take 1 tablet (10 mg total) by mouth 3 (three) times daily as needed for muscle spasms. 07/05/14   Melony Overly, MD  ferrous sulfate 325 (65 FE) MG tablet Take 1 tablet (325 mg total) by mouth 3 (three) times daily with meals. 02/27/14   Julious Oka, MD  metroNIDAZOLE (FLAGYL) 500 MG tablet On day of discharge take one tab at night, then start taking 1 tab twice a day for 5 more days. 02/27/14   Julious Oka, MD  naproxen sodium (ANAPROX) 220 MG tablet Take 220 mg by mouth 2 (two) times daily as needed (for headache).    Historical Provider, MD  ondansetron (ZOFRAN) 4 MG tablet Take 1 tablet (4 mg total) by mouth every 6 (six) hours. 02/24/14   Britt Bottom, NP  oxyCODONE-acetaminophen (PERCOCET/ROXICET) 5-325 MG per tablet Take 1-2 tablets by mouth every 6 (six) hours as needed for moderate pain. 02/27/14   Julious Oka, MD  promethazine (PHENERGAN) 12.5 MG tablet Take 1 tablet (12.5 mg total) by mouth every 6 (six) hours as needed for nausea or vomiting. 02/27/14   Julious Oka, MD   BP 124/83 mmHg  Pulse 68  Temp(Src) 102.1 F (38.9 C) (Oral)  Resp 12  SpO2 100%  LMP 06/20/2014 Physical Exam  Constitutional: She is oriented to person, place, and time. She appears well-developed and well-nourished. She appears distressed (appears tired).  Cardiovascular: Normal rate.   Pulmonary/Chest: Effort normal.  Musculoskeletal:  Back: Palpable spasm in right lower back and left upper back. 5 out of 5 strength in all extremities.  Neurological: She is alert and oriented to person, place, and time. She exhibits normal muscle tone.    ED Course  Procedures (including critical care time) Labs Review Labs Reviewed  POCT I-STAT, CHEM 8    Imaging Review No results found.   MDM   1. Muscle spasm    Potassium is normal. We'll treat with Flexeril. Follow-up as  needed.    Melony Overly, MD 07/05/14 [phone]

## 2014-07-05 NOTE — ED Notes (Signed)
Pt c/o constant back spasms onset 1.5 weeks yest night, she was also having spasms on legs and toes Taking left over muscle relaxer w/no relief.  Alert, no signs of acute distress.

## 2014-07-05 NOTE — Discharge Instructions (Signed)
Your potassium is normal. Take Flexeril 3 times a day as needed for spasms. Tried to do gentle stretching to help relieve the muscle spasms. Follow-up if no improvement in the next 2-3 days.

## 2015-01-09 ENCOUNTER — Emergency Department (HOSPITAL_COMMUNITY)
Admission: EM | Admit: 2015-01-09 | Discharge: 2015-01-10 | Disposition: A | Discharge status: Home / Self Care | Payer: Medicaid Other | Attending: Emergency Medicine | Admitting: Emergency Medicine

## 2015-01-09 ENCOUNTER — Encounter (HOSPITAL_COMMUNITY): Payer: Self-pay | Admitting: *Deleted

## 2015-01-09 DIAGNOSIS — M79602 Pain in left arm: Secondary | ICD-10-CM

## 2015-01-09 DIAGNOSIS — Z87448 Personal history of other diseases of urinary system: Secondary | ICD-10-CM | POA: Insufficient documentation

## 2015-01-09 DIAGNOSIS — D649 Anemia, unspecified: Secondary | ICD-10-CM | POA: Diagnosis not present

## 2015-01-09 DIAGNOSIS — Z8659 Personal history of other mental and behavioral disorders: Secondary | ICD-10-CM | POA: Diagnosis not present

## 2015-01-09 DIAGNOSIS — R011 Cardiac murmur, unspecified: Secondary | ICD-10-CM | POA: Insufficient documentation

## 2015-01-09 DIAGNOSIS — M79622 Pain in left upper arm: Secondary | ICD-10-CM | POA: Insufficient documentation

## 2015-01-09 DIAGNOSIS — Z720 Tobacco use: Secondary | ICD-10-CM | POA: Diagnosis not present

## 2015-01-09 DIAGNOSIS — Z79899 Other long term (current) drug therapy: Secondary | ICD-10-CM | POA: Diagnosis not present

## 2015-01-09 DIAGNOSIS — M62838 Other muscle spasm: Secondary | ICD-10-CM | POA: Insufficient documentation

## 2015-01-09 DIAGNOSIS — M25512 Pain in left shoulder: Secondary | ICD-10-CM | POA: Diagnosis not present

## 2015-01-09 DIAGNOSIS — Z8679 Personal history of other diseases of the circulatory system: Secondary | ICD-10-CM | POA: Diagnosis not present

## 2015-01-09 MED ORDER — CYCLOBENZAPRINE HCL 5 MG PO TABS: 5.0000 mg | ORAL_TABLET | Freq: Three times a day (TID) | ORAL | Status: DC | PRN

## 2015-01-09 MED ORDER — OXYCODONE-ACETAMINOPHEN 5-325 MG PO TABS: 1.0000 | ORAL_TABLET | Freq: Four times a day (QID) | ORAL | Status: DC | PRN

## 2015-01-09 MED ORDER — OXYCODONE-ACETAMINOPHEN 5-325 MG PO TABS
2.0000 | ORAL_TABLET | Freq: Once | ORAL | Status: AC
  Administered 2015-01-09: 2 via ORAL
  Filled 2015-01-09: qty 2

## 2015-01-09 MED ORDER — CYCLOBENZAPRINE HCL 10 MG PO TABS
5.0000 mg | ORAL_TABLET | Freq: Once | ORAL | Status: AC
  Administered 2015-01-09: 5 mg via ORAL
  Filled 2015-01-09: qty 1

## 2015-01-09 NOTE — ED Notes (Signed)
The pt is c/o a tingling and spasm in her lt arm from her fingers up to the lt side of her neck since yesterday.  lmp 1 week ago

## 2015-01-09 NOTE — ED Provider Notes (Signed)
CSN: [id number]     Arrival date & time 01/09/15  2023 History   First MD Initiated Contact with Patient 01/09/15 2106     Chief Complaint  Patient presents with  . Arm Problem     (Consider location/radiation/quality/duration/timing/severity/associated sxs/prior Treatment) Patient is a 39 y.o. female presenting with extremity pain.  Extremity Pain This is a new problem. The current episode started yesterday. The problem occurs constantly. The problem has been gradually worsening. Pertinent negatives include no chest pain and no shortness of breath. Associated symptoms comments: No fevers. No tingling or numbness.  Some weakness vs pain with use.  . Exacerbated by: movement, (palpation, shrugging shoulders. Nothing relieves the symptoms. Treatments tried: flexeril. Improvement on treatment: helped last night, but woke up this morning with persistent and even worsened pain.      Past Medical History  Diagnosis Date  . Anxiety   . Depression   . Heart murmur     "when I was younger; it closed up"  . Anemia   . History of blood transfusion 1993    "related to the C-section  . Migraine     "a couple times/yr" (02/25/2014)  . Pyelonephritis 02/25/2014   Past Surgical History  Procedure Laterality Date  . Cesarean section  1993  . 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 Dilation and curettage of uterus  2003  . Tubal ligation  2011   Family History  Problem Relation Age of Onset  . Diabetes Mother   . Hypertension Father    Social History  Substance Use Topics  . Smoking status: Current Every Day Smoker -- 0.50 packs/day for 6 years    Types: Cigarettes  . Smokeless tobacco: Never Used  . Alcohol Use: Yes     Comment: 02/25/2014 "don't really drink; might have a drink q now and then"   OB History    Gravida Para Term Preterm AB TAB SAB Ectopic Multiple Living   8 5 5  0 2 0 1 0 0 5     Review of Systems  Respiratory: Negative for shortness of breath.     Cardiovascular: Negative for chest pain.  All other systems reviewed and are negative.     Allergies  Review of patient's allergies indicates no known allergies.  Home Medications   Prior to Admission medications   Medication Sig Start Date End Date Taking? Authorizing Provider  naproxen sodium (ANAPROX) 220 MG tablet Take 220 mg by mouth 2 (two) times daily as needed (for headache).   Yes Historical Provider, MD  ferrous sulfate 325 (65 FE) MG tablet Take 1 tablet (325 mg total) by mouth 3 (three) times daily with meals. 02/27/14   Norman Herrlich, MD   BP 133/68 mmHg  Pulse 71  Temp(Src) 98 F (36.7 C) (Oral)  Resp 18  SpO2 100%  LMP 01/02/2015 Physical Exam  Constitutional: She is oriented to person, place, and time. She appears well-developed and well-nourished. No distress.  HENT:  Head: Normocephalic and atraumatic.  Mouth/Throat: Oropharynx is clear and moist.  Eyes: Conjunctivae are normal. Pupils are equal, round, and reactive to light. No scleral icterus.  Neck: Neck supple.  Cardiovascular: Normal rate, regular rhythm, normal heart sounds and intact distal pulses.   No murmur heard. Pulmonary/Chest: Effort normal and breath sounds normal. No stridor. No respiratory distress. She has no rales.  Abdominal: Soft. Bowel sounds are normal. She exhibits no distension. There is no tenderness.  Musculoskeletal:  Normal range of motion.       Arms: Neurological: She is alert and oriented to person, place, and time. She has normal strength. No cranial nerve deficit or sensory deficit. Coordination normal. GCS eye subscore is 4. GCS verbal subscore is 5. GCS motor subscore is 6.  Skin: Skin is warm and dry. No rash noted.  Psychiatric: She has a normal mood and affect. Her behavior is normal.  Nursing note and vitals reviewed.   ED Course  Procedures (including critical care time) Labs Review Labs Reviewed - No data to display  Imaging Review No results found. I have  personally reviewed and evaluated these images and lab results as part of my medical decision-making.   EKG Interpretation   Date/Time:  Friday January 09 2015 20:40:57 EDT Ventricular Rate:  74 PR Interval:  150 QRS Duration: 88 QT Interval:  394 QTC Calculation: 437 R Axis:   69 Text Interpretation:  Normal sinus rhythm Normal ECG Confirmed by [facility]   MD, TREY ([id number]) on 01/10/2015 12:01:35 AM      MDM   Final diagnoses:  Muscle spasm  Pain of left upper extremity    Left shoulder and posterior left arm pain.  Has a clear muscle spasm of left trapezius which appears to be source of pain.  She complained of some weakness in her arm, but she and I think this could be secondary to pain.  On strength testing, she had normal strength.  After pain medication, she felt this symptom had significantly improved.  Low risk for CVA.  Plan to treat for muscle spasm and have recommended follow up.     Serita Grit, MD 01/10/15 0001

## 2015-01-09 NOTE — Discharge Instructions (Signed)
Muscle Cramps and Spasms °Muscle cramps and spasms occur when a muscle or muscles tighten and you have no control over this tightening (involuntary muscle contraction). They are a common problem and can develop in any muscle. The most common place is in the calf muscles of the leg. Both muscle cramps and muscle spasms are involuntary muscle contractions, but they also have differences:  °· Muscle cramps are sporadic and painful. They may last a few seconds to a quarter of an hour. Muscle cramps are often more forceful and last longer than muscle spasms. °· Muscle spasms may or may not be painful. They may also last just a few seconds or much longer. °CAUSES  °It is uncommon for cramps or spasms to be due to a serious underlying problem. In many cases, the cause of cramps or spasms is unknown. Some common causes are:  °· Overexertion.   °· Overuse from repetitive motions (doing the same thing over and over).   °· Remaining in a certain position for a long period of time.   °· Improper preparation, form, or technique while performing a sport or activity.   °· Dehydration.   °· Injury.   °· Side effects of some medicines.   °· Abnormally low levels of the salts and ions in your blood (electrolytes), especially potassium and calcium. This could happen if you are taking water pills (diuretics) or you are pregnant.   °Some underlying medical problems can make it more likely to develop cramps or spasms. These include, but are not limited to:  °· Diabetes.   °· Parkinson disease.   °· Hormone disorders, such as thyroid problems.   °· Alcohol abuse.   °· Diseases specific to muscles, joints, and bones.   °· Blood vessel disease where not enough blood is getting to the muscles.   °HOME CARE INSTRUCTIONS  °· Stay well hydrated. Drink enough water and fluids to keep your urine clear or pale yellow. °· It may be helpful to massage, stretch, and relax the affected muscle. °· For tight or tense muscles, use a warm towel, heating  pad, or hot shower water directed to the affected area. °· If you are sore or have pain after a cramp or spasm, applying ice to the affected area may relieve discomfort. °¨ Put ice in a plastic bag. °¨ Place a towel between your skin and the bag. °¨ Leave the ice on for 15-20 minutes, 03-04 times a day. °· Medicines used to treat a known cause of cramps or spasms may help reduce their frequency or severity. Only take over-the-counter or prescription medicines as directed by your caregiver. °SEEK MEDICAL CARE IF:  °Your cramps or spasms get more severe, more frequent, or do not improve over time.  °MAKE SURE YOU:  °· Understand these instructions. °· Will watch your condition. °· Will get help right away if you are not doing well or get worse. °Document Released: 10/15/2001 Document Revised: 08/20/2012 Document Reviewed: 04/11/2012 °ExitCare® Patient Information ©2015 [employer]. This information is not intended to replace advice given to you by your health care provider. Make sure you discuss any questions you have with your health care provider. ° ° °Emergency Department Resource Guide °1) Find a Doctor and Pay Out of Pocket °Although you won't have to find out who is covered by your insurance plan, it is a good idea to ask around and get recommendations. You will then need to call the office and see if the doctor you have chosen will accept you as a new patient and what types of options they offer   for patients who are self-pay. Some doctors offer discounts or will set up payment plans for their patients who do not have insurance, but you will need to ask so you aren't surprised when you get to your appointment. ° °2) Contact Your Local Health Department °Not all health departments have doctors that can see patients for sick visits, but many do, so it is worth a call to see if yours does. If you don't know where your local health department is, you can check in your phone book. The CDC also has a tool to help  you locate your state's health department, and many state websites also have listings of all of their local health departments. ° °3) Find a Walk-in Clinic °If your illness is not likely to be very severe or complicated, you may want to try a walk in clinic. These are popping up all over the country in pharmacies, drugstores, and shopping centers. They're usually staffed by nurse practitioners or physician assistants that have been trained to treat common illnesses and complaints. They're usually fairly quick and inexpensive. However, if you have serious medical issues or chronic medical problems, these are probably not your best option. ° °No Primary Care Doctor: °- Call [facility] at  832-8000 - they can help you locate a primary care doctor that  accepts your insurance, provides certain services, etc. °- Physician Referral Service- 1-800-533-3463 ° °Chronic Pain Problems: °Organization         Address  Phone   Notes  °[facility]  (336) 297-2271 Patients need to be referred by their primary care doctor.  ° °Medication Assistance: °Organization         Address  Phone   Notes  °[place] [facility] [address] °[place], [place] [address] (336) 641-8030 --Must be a resident of [place] °-- Must have NO insurance coverage whatsoever (no Medicaid/ Medicare, etc.) °-- The pt. MUST have a primary care doctor that directs their care regularly and follows them in the community °  °[facility]  (866) 331-1348   °[employer]  (888) 892-1162   ° °Agencies that provide inexpensive medical care: °Organization         Address  Phone   Notes  °[facility]  (336) 832-8035   °[facility]    (336) 832-7272   °[facility] [address] °[place], [place] [address] (336) 832-4777   °[facility] [address], °[place] (336) 271-4999   °[facility]    (336) 373-0678   °[facility]    (336) 272-1050   °[facility] ° [address], [place] Phone:  (336) 832-4444, Fax:  (336) 832-4440 Hours of Operation:  9 am - 6 pm, M-F.  Also accepts Medicaid/Medicare and self-pay.  °[facility] ° [address], [place] Phone: (336) 832-3150, Fax: (336) 832-3151. Hours of Operation:  8:30 am - 5:30 pm, M-F.  Also accepts Medicaid and self-pay.  °[facility] [place] [address], [place] Phone: (336) 878-6027   °[facility] [address], [place], [place] (336)723-1848, Ext. 123 Mondays & Thursdays: 7-9 AM.  First 15 patients are seen on a first come, first serve basis. °  ° °Medicaid-accepting [place] Providers: ° °Organization         Address  Phone   Notes  °[facility]   [address], [place] (336) 641-2100 Also accepts self-pay patients.  °[facility] [address], [place] ° (336) 856-9996   °[facility] [address], [place] (336) 288-8857   °[facility] [address] [place] [address], [place] (336) 299-7000   °Veita Bland [address], [place]  ° (336) 373-1557 Only accepts [place] Access Medicaid patients after they have their name applied to their card.  ° °Self-Pay (no insurance) in [place]: ° °Organization         Address  Phone   Notes  °Sickle Cell Patients, [facility] [address], [place] (336) 832-1970   °[facility] [address], [place] (336) 832-4400   ° [facility] ° [address], [place] (336) 992-4800   °[facility]/Dr. Osei-Bonsu ° [address] [place] [address], [place] or [address], [place] (336) 841-8500 Phone number for both [place] and [place] locations is the same.  °[facility] [address],  [place] (336) 299-0000   °[facility] [address] [place] [address], [place] or [address] (336) 852-7530 °(336) 878-2260   °[facility] [address], [place] (336) 350-1642, phone; (336) 294-5005, fax Sees patients 1st and 3rd Saturday of every month.  Must not qualify for public or private insurance (i.e. Medicaid, Medicare, [place] Health Choice, Veterans' Benefits) • Household income should be no more than 200% of the poverty level •The clinic cannot treat you if you are pregnant or think you are pregnant • Sexually transmitted diseases are not treated at the clinic.  ° ° °Dental Care: °Organization         Address  Phone  Notes  °[facility] [address], [place] (336) 641-6152 Accepts children up to age 21 who are enrolled in Medicaid or [place] Health Choice; pregnant women with a Medicaid card; and children who have applied for Medicaid or [place] Health Choice, but were declined, whose parents can pay a reduced fee at time of service.  °[facility] [place]  [address], [place] (336) 641-7733 Accepts children up to age 21 who are enrolled in Medicaid or [place] Health Choice; pregnant women with a Medicaid card; and children who have applied for Medicaid or  Health Choice, but were declined, whose parents can pay a reduced fee at time of service.  °[facility] ° [address], [place] (336) 641-4533 Patients are seen by appointment only. Walk-ins are not accepted. [facility] will see patients 18 years of age and older. °Monday - Tuesday (8am-5pm) °Most Wednesdays (8:30-5pm) °$30 per visit, cash only  °[facility] ° [address], [place] (336) 641-4533 Patients are seen by appointment only. Walk-ins are not accepted. [facility] will see patients 18 years of age and older. °One Wednesday Evening  (Monthly: Volunteer Based).  $30 per visit, cash only  °[facility]  (919) 537-3737 for adults; Children under age 4, call Graduate Pediatric Dentistry at ([phone]) [phone]. Children aged 4-14, please call (919) [phone] to request a pediatric application. ° Dental services are provided in all areas of dental care including fillings, crowns and bridges, complete and partial dentures, implants, gum   treatment, root canals, and extractions. Preventive care is also provided. Treatment is provided to both adults and children. °Patients are selected via a lottery and there is often a waiting list. °  °[facility] [address], °[place] ° (336) 763-8833 [email or web address] °  °[facility] [address], [place], [place] (336)723-1848, Ext. 123 Second and Fourth Thursday of each month, opens at 6:30 AM; Clinic ends at 9 AM.  Patients are seen on a first-come first-served basis, and a limited number are seen during each clinic.  ° °[facility] ° [address], [place], [place] (336) 723-7904   Eligibility Requirements °You must have lived in [place], [place], or [place] counties for at least the last three months. °  You cannot be eligible for state or federal sponsored healthcare insurance, including Veterans Administration, Medicaid, or Medicare. °  You generally cannot be eligible for healthcare insurance through your employer.  °  How to apply: °Eligibility screenings are held every Tuesday and Wednesday afternoon from 1:00 pm until 4:00 pm. You do not need an appointment for the interview!  °[facility] [address], [place], [place] 336-631-2330   °[facility]  336-342-8273   °[facility]  336-703-3100   °[facility]  336-570-6415   ° °Behavioral Health Resources in the Community: °Intensive Outpatient Programs °Organization         Address  Phone  Notes  °[place]  [facility] [address], [place], [place] 336-878-6098   °[facility] [address], [place], [place] 336-832-9800   °[facility] [address], [place], [place] ° 336-882-2125   °[facility] [address],  °[place], [place] 1-800-853-5163 or 336-641-4981   °Substance Abuse Resources °Organization         Address  Phone  Notes  °[facility]  336-882-2125   °[facility]  336-784-9470   °[facility]  336-285-9073   °[facility]  336-845-3988   °Residential & Outpatient Substance Abuse Program  1-800-659-3381   °Psychological Services °Organization         Address  Phone  Notes  °[facility]  336- 832-9600   °[facility]  336- 378-7881   °[facility] [address], [place] 1-800-853-5163 or 336-641-4981   ° °Mobile Crisis Teams °Organization         Address  Phone  Notes  °[facility], Mobile Crisis Care Unit  1-877-626-1772   °Assertive °[facility] ° [address] [place], [place] 336-834-9664   °Sharon DeEsch [address] °[place] 336-554-5454   ° °Self-Help/Support Groups °Organization         Address  Phone             Notes  °[facility] - variety of support groups  336- 373-1402 Call for more information  °Narcotics Anonymous (NA), [facility] [address], °[place]  2 meetings at this location  ° °Residential Treatment Programs °Organization         Address  Phone  Notes  °[facility] [address],    °[place]  1-866-801-8205   °[facility] ° [address], [place],  704-293-8524   °[facility] [address], [place] 336-845-3988 Admissions: 8am-3pm M-F  °[facility] [address]    °[place], [place] 336-841-1104   °[facility] [address],  [place], [place] 336-379-7146   °[facility] [address]  °[place], [place] 336-285-9073   °[facility] - Intensive Outpatient [address], [place], [place] 336-852-3033   °[facility] [address]  °[place], [place] 1-877-615-2722 or 336-784-9470   °[facility] [address] [place], [place] 336-227-7417 Accepts Medicaid  °[facility] [address]  °[place] 1-800-659-3381 Substance Abuse/Addiction Treatment  ° °[place] Behavioral Health Resources °Organization         Address  Phone  Notes  °[facility]  (888) 581-9988   °Julie Brannon, PhD [address] [place], [place]   (336) 349-5553 or (336) 951-0000   °[facility]   [address] °[place], [place] (336) 349-4454   °[facility] [address], [place], [place] (336) 342-8316 Insurance/Medicaid/sponsorship through [facility]  °[facility] [address]                                    [place], [place] (336) 342-8316 Therapy/tele-psych/case  °[facility] [address]  ° [place], [place] (336) 349-2233    °Dr. Arfeen  (336) 349-4544   °[facility] [place]  [employer] [place] [facility] 1) [address],  °2) [address], [place] °3)  [address], [place] (336) 349-3220 °(336) 342-7768 ° °(336) 342-8140   °[place] [facility] (336) 342-1394 or (336) 342-3537 (After Hours)    ° ° ° °

## 2015-07-01 ENCOUNTER — Emergency Department (HOSPITAL_COMMUNITY)
Admission: EM | Admit: 2015-07-01 | Discharge: 2015-07-01 | Discharge status: Against Medical Advice | Payer: Medicaid Other

## 2015-07-01 NOTE — ED Notes (Signed)
Pt's name called for triage no answer 

## 2015-07-10 ENCOUNTER — Inpatient Hospital Stay (HOSPITAL_COMMUNITY)
Admission: AD | Admit: 2015-07-10 | Discharge: 2015-07-10 | Disposition: A | Discharge status: Home / Self Care | Payer: Medicaid Other | Source: Ambulatory Visit | Attending: Obstetrics & Gynecology | Admitting: Obstetrics & Gynecology

## 2015-07-10 ENCOUNTER — Encounter (HOSPITAL_COMMUNITY): Payer: Self-pay | Admitting: *Deleted

## 2015-07-10 DIAGNOSIS — F17210 Nicotine dependence, cigarettes, uncomplicated: Secondary | ICD-10-CM | POA: Diagnosis not present

## 2015-07-10 DIAGNOSIS — F419 Anxiety disorder, unspecified: Secondary | ICD-10-CM | POA: Insufficient documentation

## 2015-07-10 DIAGNOSIS — Z113 Encounter for screening for infections with a predominantly sexual mode of transmission: Secondary | ICD-10-CM

## 2015-07-10 DIAGNOSIS — A499 Bacterial infection, unspecified: Secondary | ICD-10-CM | POA: Diagnosis not present

## 2015-07-10 DIAGNOSIS — F329 Major depressive disorder, single episode, unspecified: Secondary | ICD-10-CM | POA: Insufficient documentation

## 2015-07-10 DIAGNOSIS — G43909 Migraine, unspecified, not intractable, without status migrainosus: Secondary | ICD-10-CM | POA: Diagnosis not present

## 2015-07-10 DIAGNOSIS — N760 Acute vaginitis: Secondary | ICD-10-CM | POA: Diagnosis not present

## 2015-07-10 DIAGNOSIS — B9689 Other specified bacterial agents as the cause of diseases classified elsewhere: Secondary | ICD-10-CM

## 2015-07-10 DIAGNOSIS — N898 Other specified noninflammatory disorders of vagina: Secondary | ICD-10-CM | POA: Diagnosis present

## 2015-07-10 HISTORY — DX: Other muscle spasm: M62.838

## 2015-07-10 LAB — URINALYSIS, ROUTINE W REFLEX MICROSCOPIC
BILIRUBIN URINE: NEGATIVE
Glucose, UA: NEGATIVE mg/dL
Hgb urine dipstick: NEGATIVE
KETONES UR: 15 mg/dL — AB
Leukocytes, UA: NEGATIVE
NITRITE: NEGATIVE
PH: 6 (ref 5.0–8.0)
Protein, ur: NEGATIVE mg/dL
Specific Gravity, Urine: 1.02 (ref 1.005–1.030)

## 2015-07-10 LAB — CBC
HEMATOCRIT: 30.8 % — AB (ref 36.0–46.0)
HEMOGLOBIN: 9.7 g/dL — AB (ref 12.0–15.0)
MCH: 21.6 pg — ABNORMAL LOW (ref 26.0–34.0)
MCHC: 31.5 g/dL (ref 30.0–36.0)
MCV: 68.6 fL — AB (ref 78.0–100.0)
Platelets: 215 10*3/uL (ref 150–400)
RBC: 4.49 MIL/uL (ref 3.87–5.11)
RDW: 20.4 % — ABNORMAL HIGH (ref 11.5–15.5)
WBC: 9.7 10*3/uL (ref 4.0–10.5)

## 2015-07-10 LAB — WET PREP, GENITAL
Sperm: NONE SEEN
TRICH WET PREP: NONE SEEN
Yeast Wet Prep HPF POC: NONE SEEN

## 2015-07-10 LAB — POCT PREGNANCY, URINE
PREG TEST UR: NEGATIVE
PREG TEST UR: NEGATIVE

## 2015-07-10 MED ORDER — METRONIDAZOLE 500 MG PO TABS: 500.0000 mg | ORAL_TABLET | Freq: Two times a day (BID) | ORAL | Status: DC

## 2015-07-10 NOTE — MAU Provider Note (Signed)
History     CSN: [id number]  Arrival date and time: 07/10/15 1211   First Provider Initiated Contact with Patient 07/10/15 1243       Chief Complaint  Patient presents with  . Vaginal Discharge   Female GU Problem The patient's primary symptoms include a genital odor and vaginal discharge. The patient's pertinent negatives include no genital itching, genital lesions or vaginal bleeding. The current episode started yesterday. The problem occurs constantly. The problem has been unchanged. The pain is mild. She is not pregnant. Associated symptoms include abdominal pain. Pertinent negatives include no chills, constipation, diarrhea, dysuria, fever, nausea, painful intercourse or vomiting. The vaginal discharge was malodorous, white and thick. There has been no bleeding. Nothing aggravates the symptoms. She has tried nothing for the symptoms. She is sexually active. It is possible (wants STD testing) that her partner has an STD. She uses tubal ligation for contraception. Her menstrual history has been regular. Her past medical history is significant for an STD and vaginosis. There is no history of herpes simplex or PID.     OB History    Gravida Para Term Preterm AB TAB SAB Ectopic Multiple Living   8 5 5  0 2 0 1 0 0 5      Past Medical History  Diagnosis Date  . Anxiety   . Depression   . Heart murmur     "when I was younger; it closed up"  . Anemia   . History of blood transfusion 1993    "related to the C-section  . Migraine     "a couple times/yr" (02/25/2014)  . Pyelonephritis 02/25/2014  . Muscle spasm     Past Surgical History  Procedure Laterality Date  . Cesarean section  1993  . 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 Dilation and curettage of uterus  2003  . Tubal ligation  2011    Family History  Problem Relation Age of Onset  . Diabetes Mother   . Hypertension Father     Social History  Substance Use Topics  . Smoking status: Current Every Day  Smoker -- 0.25 packs/day for 6 years    Types: Cigarettes  . Smokeless tobacco: Never Used  . Alcohol Use: Yes     Comment: 02/25/2014 "don't really drink; might have a drink q now and then"    Allergies: No Known Allergies  Prescriptions prior to admission  Medication Sig Dispense Refill Last Dose  . cyclobenzaprine (FLEXERIL) 5 MG tablet Take 1 tablet (5 mg total) by mouth 3 (three) times daily as needed for muscle spasms. 10 tablet 0   . ferrous sulfate 325 (65 FE) MG tablet Take 1 tablet (325 mg total) by mouth 3 (three) times daily with meals. 90 tablet 3 Unknown at Unknown time  . naproxen sodium (ANAPROX) 220 MG tablet Take 220 mg by mouth 2 (two) times daily as needed (for headache).   PRN  . oxyCODONE-acetaminophen (PERCOCET/ROXICET) 5-325 MG per tablet Take 1-2 tablets by mouth every 6 (six) hours as needed for severe pain. 8 tablet 0     Review of Systems  Constitutional: Negative.  Negative for fever and chills.  Gastrointestinal: Positive for abdominal pain. Negative for nausea, vomiting, diarrhea and constipation.  Genitourinary: Positive for vaginal discharge. Negative for dysuria.       + malodorous vaginal discharge No vaginal bleeding, dyspareunia, or postcoital bleeding   Physical Exam   Blood pressure 128/70, pulse 70, temperature  98.2 F (36.8 C), temperature source Oral, resp. rate 16, height 5' 8.5" (1.74 m), weight 223 lb 9.6 oz (101.424 kg), last menstrual period 06/21/2015.  Physical Exam  Nursing note and vitals reviewed. Constitutional: She is oriented to person, place, and time. She appears well-developed and well-nourished. No distress.  HENT:  Head: Normocephalic and atraumatic.  Eyes: Conjunctivae are normal. Right eye exhibits no discharge. Left eye exhibits no discharge. No scleral icterus.  Neck: Normal range of motion.  Cardiovascular: Normal rate, regular rhythm and normal heart sounds.   No murmur heard. Respiratory: Effort normal and breath  sounds normal. No respiratory distress. She has no wheezes.  GI: Soft. She exhibits no distension. There is no tenderness.  Genitourinary: Uterus normal. There is no rash or lesion on the right labia. There is no rash or lesion on the left labia. Cervix exhibits no motion tenderness and no friability. No bleeding in the vagina. Vaginal discharge (small amount of thin tan discharge) found.  Neurological: She is alert and oriented to person, place, and time.  Skin: Skin is warm and dry. She is not diaphoretic.  Psychiatric: She has a normal mood and affect. Her behavior is normal. Judgment and thought content normal.    MAU Course  Procedures Results for orders placed or performed during the hospital encounter of 07/10/15 (from the past 24 hour(s))  Urinalysis, Routine w reflex microscopic (not at [facility])     Status: Abnormal   Collection Time: 07/10/15 12:30 PM  Result Value Ref Range   Color, Urine YELLOW YELLOW   APPearance CLEAR CLEAR   Specific Gravity, Urine 1.020 1.005 - 1.030   pH 6.0 5.0 - 8.0   Glucose, UA NEGATIVE NEGATIVE mg/dL   Hgb urine dipstick NEGATIVE NEGATIVE   Bilirubin Urine NEGATIVE NEGATIVE   Ketones, ur 15 (A) NEGATIVE mg/dL   Protein, ur NEGATIVE NEGATIVE mg/dL   Nitrite NEGATIVE NEGATIVE   Leukocytes, UA NEGATIVE NEGATIVE  Pregnancy, urine POC     Status: None   Collection Time: 07/10/15 12:45 PM  Result Value Ref Range   Preg Test, Ur NEGATIVE NEGATIVE  Pregnancy, urine POC     Status: None   Collection Time: 07/10/15 12:50 PM  Result Value Ref Range   Preg Test, Ur NEGATIVE NEGATIVE  CBC     Status: Abnormal   Collection Time: 07/10/15 12:54 PM  Result Value Ref Range   WBC 9.7 4.0 - 10.5 K/uL   RBC 4.49 3.87 - 5.11 MIL/uL   Hemoglobin 9.7 (L) 12.0 - 15.0 g/dL   HCT 30.8 (L) 36.0 - 46.0 %   MCV 68.6 (L) 78.0 - 100.0 fL   MCH 21.6 (L) 26.0 - 34.0 pg   MCHC 31.5 30.0 - 36.0 g/dL   RDW 20.4 (H) 11.5 - 15.5 %   Platelets 215 150 - 400 K/uL  Wet prep,  genital     Status: Abnormal   Collection Time: 07/10/15  1:30 PM  Result Value Ref Range   Yeast Wet Prep HPF POC NONE SEEN NONE SEEN   Trich, Wet Prep NONE SEEN NONE SEEN   Clue Cells Wet Prep HPF POC PRESENT (A) NONE SEEN   WBC, Wet Prep HPF POC MODERATE (A) NONE SEEN   Sperm NONE SEEN     MDM UPT negative STD testing per patient request: GC/CT, wet prep, CBC, HIV, RPR Assessment and Plan  A: 1. BV (bacterial vaginosis)   2. Screen for STD (sexually transmitted disease)  P: Discharge home Rx flagyl (no alcohol) GC/CT, HIV, RPR pending  Jorje Guild 07/10/2015, 12:43 PM

## 2015-07-10 NOTE — MAU Note (Signed)
C/o vaginal discharge for past 3 days;

## 2015-07-10 NOTE — Discharge Instructions (Signed)

## 2015-07-10 NOTE — MAU Note (Signed)
Patient presents with vaginal discharge with an odor x 2 days, LMP 06/22/15

## 2015-07-11 LAB — HIV ANTIBODY (ROUTINE TESTING W REFLEX): HIV Screen 4th Generation wRfx: NONREACTIVE

## 2015-07-11 LAB — RPR: RPR Ser Ql: NONREACTIVE

## 2015-07-13 LAB — GC/CHLAMYDIA PROBE AMP (~~LOC~~) NOT AT ARMC
CHLAMYDIA, DNA PROBE: NEGATIVE
Neisseria Gonorrhea: NEGATIVE

## 2015-12-15 ENCOUNTER — Encounter (HOSPITAL_COMMUNITY): Payer: Self-pay

## 2015-12-15 ENCOUNTER — Emergency Department (HOSPITAL_COMMUNITY)
Admission: EM | Admit: 2015-12-15 | Discharge: 2015-12-16 | Disposition: A | Discharge status: Home / Self Care | Payer: Medicaid Other | Attending: Emergency Medicine | Admitting: Emergency Medicine

## 2015-12-15 DIAGNOSIS — F17210 Nicotine dependence, cigarettes, uncomplicated: Secondary | ICD-10-CM | POA: Diagnosis not present

## 2015-12-15 DIAGNOSIS — D259 Leiomyoma of uterus, unspecified: Secondary | ICD-10-CM | POA: Insufficient documentation

## 2015-12-15 DIAGNOSIS — R1084 Generalized abdominal pain: Secondary | ICD-10-CM

## 2015-12-15 DIAGNOSIS — R109 Unspecified abdominal pain: Secondary | ICD-10-CM | POA: Diagnosis present

## 2015-12-15 LAB — CBC
HCT: 33.4 % — ABNORMAL LOW (ref 36.0–46.0)
Hemoglobin: 10 g/dL — ABNORMAL LOW (ref 12.0–15.0)
MCH: 21 pg — AB (ref 26.0–34.0)
MCHC: 29.9 g/dL — AB (ref 30.0–36.0)
MCV: 70.2 fL — ABNORMAL LOW (ref 78.0–100.0)
PLATELETS: 278 10*3/uL (ref 150–400)
RBC: 4.76 MIL/uL (ref 3.87–5.11)
RDW: 20 % — ABNORMAL HIGH (ref 11.5–15.5)
WBC: 11.9 10*3/uL — ABNORMAL HIGH (ref 4.0–10.5)

## 2015-12-15 LAB — URINALYSIS, ROUTINE W REFLEX MICROSCOPIC
BILIRUBIN URINE: NEGATIVE
Glucose, UA: NEGATIVE mg/dL
HGB URINE DIPSTICK: NEGATIVE
KETONES UR: NEGATIVE mg/dL
Leukocytes, UA: NEGATIVE
Nitrite: NEGATIVE
PROTEIN: NEGATIVE mg/dL
Specific Gravity, Urine: 1.024 (ref 1.005–1.030)
pH: 6.5 (ref 5.0–8.0)

## 2015-12-15 LAB — COMPREHENSIVE METABOLIC PANEL
ALK PHOS: 55 U/L (ref 38–126)
ALT: 7 U/L — AB (ref 14–54)
AST: 6 U/L — ABNORMAL LOW (ref 15–41)
Albumin: 3.9 g/dL (ref 3.5–5.0)
Anion gap: 6 (ref 5–15)
BUN: 7 mg/dL (ref 6–20)
CO2: 26 mmol/L (ref 22–32)
Calcium: 9.6 mg/dL (ref 8.9–10.3)
Chloride: 106 mmol/L (ref 101–111)
Creatinine, Ser: 0.98 mg/dL (ref 0.44–1.00)
GFR calc non Af Amer: >60 mL/min (ref >60)
Glucose, Bld: 89 mg/dL (ref 65–99)
Potassium: 4.3 mmol/L (ref 3.5–5.1)
SODIUM: 138 mmol/L (ref 135–145)
Total Bilirubin: 0.4 mg/dL (ref 0.3–1.2)
Total Protein: 7.9 g/dL (ref 6.5–8.1)

## 2015-12-15 LAB — LIPASE, BLOOD: Lipase: 23 U/L (ref 11–51)

## 2015-12-15 LAB — I-STAT BETA HCG BLOOD, ED (MC, WL, AP ONLY): I-stat hCG, quantitative: <5 m[IU]/mL (ref <5)

## 2015-12-15 NOTE — ED Triage Notes (Signed)
Onset 3 days cramping bilateral lower back radiating around to bilateral lower extremities.  Today pain radiated down right anterior thigh.  No other s/s noted.

## 2015-12-16 ENCOUNTER — Inpatient Hospital Stay (EMERGENCY_DEPARTMENT_HOSPITAL)
Admission: AD | Admit: 2015-12-16 | Discharge: 2015-12-17 | Disposition: A | Discharge status: Home / Self Care | Payer: Medicaid Other | Source: Ambulatory Visit | Attending: Obstetrics and Gynecology | Admitting: Obstetrics and Gynecology

## 2015-12-16 ENCOUNTER — Encounter (HOSPITAL_COMMUNITY): Payer: Self-pay | Admitting: Obstetrics and Gynecology

## 2015-12-16 ENCOUNTER — Emergency Department (HOSPITAL_COMMUNITY): Payer: Medicaid Other

## 2015-12-16 DIAGNOSIS — F419 Anxiety disorder, unspecified: Secondary | ICD-10-CM

## 2015-12-16 DIAGNOSIS — F17210 Nicotine dependence, cigarettes, uncomplicated: Secondary | ICD-10-CM

## 2015-12-16 DIAGNOSIS — F329 Major depressive disorder, single episode, unspecified: Secondary | ICD-10-CM | POA: Insufficient documentation

## 2015-12-16 DIAGNOSIS — D649 Anemia, unspecified: Secondary | ICD-10-CM | POA: Insufficient documentation

## 2015-12-16 DIAGNOSIS — D259 Leiomyoma of uterus, unspecified: Secondary | ICD-10-CM | POA: Diagnosis not present

## 2015-12-16 DIAGNOSIS — B9689 Other specified bacterial agents as the cause of diseases classified elsewhere: Secondary | ICD-10-CM | POA: Insufficient documentation

## 2015-12-16 DIAGNOSIS — R1084 Generalized abdominal pain: Secondary | ICD-10-CM

## 2015-12-16 DIAGNOSIS — N760 Acute vaginitis: Secondary | ICD-10-CM | POA: Insufficient documentation

## 2015-12-16 MED ORDER — KETOROLAC TROMETHAMINE 60 MG/2ML IM SOLN
60.0000 mg | Freq: Once | INTRAMUSCULAR | Status: AC
  Administered 2015-12-16: 60 mg via INTRAMUSCULAR
  Filled 2015-12-16: qty 2

## 2015-12-16 MED ORDER — IOPAMIDOL (ISOVUE-370) INJECTION 76%: 100.0000 mL | Freq: Once | INTRAVENOUS | Status: DC | PRN

## 2015-12-16 MED ORDER — HYDROMORPHONE HCL 1 MG/ML IJ SOLN
INTRAMUSCULAR | Status: AC
  Administered 2015-12-16: 1 mg via INTRAVENOUS
  Filled 2015-12-16: qty 1

## 2015-12-16 MED ORDER — HYDROMORPHONE HCL 1 MG/ML IJ SOLN
1.0000 mg | Freq: Once | INTRAMUSCULAR | Status: AC
Start: 2015-12-16 — End: 2015-12-16
  Administered 2015-12-16: 1 mg via INTRAVENOUS

## 2015-12-16 MED ORDER — ONDANSETRON HCL 4 MG/2ML IJ SOLN
INTRAMUSCULAR | Status: AC
  Filled 2015-12-16: qty 2

## 2015-12-16 MED ORDER — MORPHINE SULFATE (PF) 4 MG/ML IV SOLN
INTRAVENOUS | Status: AC
  Filled 2015-12-16: qty 2

## 2015-12-16 NOTE — ED Notes (Signed)
Pt back from ct scan.

## 2015-12-16 NOTE — ED Provider Notes (Signed)
[facility] DEPT Provider Note   CSN: [id number] Arrival date & time: 12/15/15  1918  First Provider Contact:   First MD Initiated Contact with Patient 12/16/15 0128     By signing my name below, I, Gabriella Snyder, attest that this documentation has been prepared under the direction and in the presence of Forde Dandy, MD.  Electronically Signed: Julien Snyder, ED Scribe. 12/16/15. 1:30 AM.    History   Chief Complaint Chief Complaint  Patient presents with  . Abdominal Pain     HPI  HPI SYNDEY Gabriella Snyder is a 40 y.o. female presenting to the Emergency Department with sudden onset, constant, gradual worsening, bilateral flank/low back pain (R>L) that radiates into her abdomen x 3 days. She notes associated loose stool and loss of appetite x 1 day. Pt says that her symptoms send sharp shooting pain into both of her legs. She has a hx of kidney stones but notes this feels different. She has been taking oxycodone 5 mg and flexeril to alleviate her pain with no relief.  Pt has an abdominal surgical hx of cesarean section. Pt denies fever, n/v, urinary frequency, dysuria, hematuria, abnormal vaginal bleeding, abnormal vaginal discharge, cough, congestion, rhinorrhea. Pt has no known drug allergies.    Past Medical History:  Diagnosis Date  . Anemia   . Anxiety   . Depression   . Heart murmur    "when I was younger; it closed up"  . History of blood transfusion 1993   "related to the C-section  . Migraine    "a couple times/yr" (02/25/2014)  . Muscle spasm   . Pyelonephritis 02/25/2014    Patient Active Problem List   Diagnosis Date Noted  . BV (bacterial vaginosis) 02/27/2014  . Iron deficiency anemia 02/27/2014  . Pyelonephritis 02/25/2014  . Acute pyelonephritis 02/25/2014    Past Surgical History:  Procedure Laterality Date  . CESAREAN SECTION  1993  . DILATION AND CURETTAGE OF UTERUS  2003  . TUBAL LIGATION  2011  . TUMOR EXCISION Right ~ 2004   "off my back"      OB History    Gravida Para Term Preterm AB Living   8 5 5  0 2 5   SAB TAB Ectopic Multiple Live Births   1 0 0 0         Home Medications    Prior to Admission medications   Medication Sig Start Date End Date Taking? Authorizing Provider  cyclobenzaprine (FLEXERIL) 5 MG tablet Take 1 tablet (5 mg total) by mouth 3 (three) times daily as needed for muscle spasms. 01/09/15   Serita Grit, MD  metroNIDAZOLE (FLAGYL) 500 MG tablet Take 1 tablet (500 mg total) by mouth 2 (two) times daily. 07/10/15   Jorje Guild, NP  oxyCODONE-acetaminophen (PERCOCET/ROXICET) 5-325 MG per tablet Take 1-2 tablets by mouth every 6 (six) hours as needed for severe pain. 01/09/15   Serita Grit, MD  Prenatal Vit-Fe Fumarate-FA (PRENATAL MULTIVITAMIN) TABS tablet Take 1 tablet by mouth daily at 12 noon.    Historical Provider, MD    Family History Family History  Problem Relation Age of Onset  . Diabetes Mother   . Hypertension Father     Social History Social History  Substance Use Topics  . Smoking status: Current Every Day Smoker    Packs/day: 0.25    Years: 6.00    Types: Cigarettes  . Smokeless tobacco: Never Used  . Alcohol use Yes     Comment: 02/25/2014 "  don't really drink; might have a drink q now and then"     Allergies   Review of patient's allergies indicates no known allergies.   Review of Systems Review of Systems  10/14 Systems reviewed and are negative for acute change except as noted in the HPI.  Physical Exam Updated Vital Signs BP 121/76   Pulse 66   Temp 98 F (36.7 C) (Oral)   Resp 16   Ht 5\' 10"  (1.778 m)   Wt 226 lb (102.5 kg)   LMP 11/29/2015   SpO2 100%   BMI 32.43 kg/m   Physical Exam Physical Exam  Nursing note and vitals reviewed. Constitutional: Well developed, well nourished, non-toxic, and in no acute distress Head: Normocephalic and atraumatic.  Mouth/Throat: Oropharynx is clear and moist.  Neck: Normal range of motion. Neck supple.   Cardiovascular: Normal rate and regular rhythm.   Pulmonary/Chest: Effort normal and breath sounds normal.  Abdominal: Soft. There is diffuse abdominal pain and bilateral CVA/low back tenderness. There is no rebound and no guarding.  Musculoskeletal: Normal range of motion.  Neurological: Alert, no facial droop, fluent speech, moves all extremities symmetrically Skin: Skin is warm and dry.  Psychiatric: Cooperative   ED Treatments / Results  DIAGNOSTIC STUDIES: Oxygen Saturation is 100% on RA, normal by my interpretation.  COORDINATION OF CARE:  1:28 AM Discussed treatment plan which includes pain medication and IV fluids with pt at bedside and pt agreed to plan.  Labs (all labs ordered are listed, but only abnormal results are displayed) Labs Reviewed  COMPREHENSIVE METABOLIC PANEL - Abnormal; Notable for the following:       Result Value   AST 6 (*)    ALT 7 (*)    All other components within normal limits  CBC - Abnormal; Notable for the following:    WBC 11.9 (*)    Hemoglobin 10.0 (*)    HCT 33.4 (*)    MCV 70.2 (*)    MCH 21.0 (*)    MCHC 29.9 (*)    RDW 20.0 (*)    All other components within normal limits  LIPASE, BLOOD  URINALYSIS, ROUTINE W REFLEX MICROSCOPIC (NOT AT [facility])  I-STAT BETA HCG BLOOD, ED ([facility], [facility], [facility] ONLY)    EKG  EKG Interpretation None       Radiology Ct Abdomen Pelvis W Contrast  Result Date: 12/16/2015 CLINICAL DATA:  Initial evaluation for acute abdominal cramping with low were back pain for 3 days. Pain radiates into bilateral lower extremity. Next a I EXAM: CT ABDOMEN AND PELVIS WITH CONTRAST TECHNIQUE: Multidetector CT imaging of the abdomen and pelvis was performed using the standard protocol following bolus administration of intravenous contrast. CONTRAST:  100 cc of Isovue-300. COMPARISON:  Prior CT from 02/25/2014. FINDINGS: Visualized lung bases are clear. Trace pericardial fluid noted.  No pleural effusion. Irregular hypodense lesion  within the subcapsular right hepatic lobe measures approximately 2.8 x 1.6 cm, indeterminate, but most likely a benign hemangioma. This is similar to previous. Liver otherwise unremarkable. Gallbladder within normal limits. No biliary dilatation. Spleen, adrenal glands, and pancreas are within normal limits. Kidneys are equal in size with symmetric enhancement. No nephrolithiasis, hydronephrosis, or focal enhancing renal mass. Stomach within normal limits. No evidence for bowel obstruction. Appendix is normal. No abnormal wall thickening, mucosal enhancement, or inflammatory fat stranding seen about the bowels. Bladder largely decompressed without definite abnormality. Mild circumferential bladder wall thickening likely related incomplete distension. Multiple fibroids present within the uterus.  Largest of these is hypodense in appearance and measures 3.1 x 3.6 x 4.0 cm. This is increased in size relative to most recent examination. Ovaries within normal limits. Probable small degenerating corpus luteal cyst noted within the right ovary. Small volume free fluid within the right pelvic cul-de-sac, presumably physiologic. No free air. No pathologically enlarged lymph nodes identified. Normal intravascular enhancement seen throughout the intra-abdominal aorta and its branch vessels. No acute osseous abnormality. No worrisome lytic or blastic osseous lesions. IMPRESSION: 1. No CT evidence for acute intra-abdominal or pelvic process. 2. Fibroid uterus, increased in size and undergone cystic degeneration relative to most recent examination from 2015. 3. Small volume free fluid within the pelvis, presumably physiologic. 4. 2.8 cm hypodense lesion within the subcapsular right hepatic lobe at the hepatic dome, indeterminate, but most likely a benign hemangioma. This is similar relative to prior examination. Electronically Signed   By: Jeannine Boga M.D.   On: 12/16/2015 03:15    Procedures Procedures (including  critical care time)  Medications Ordered in ED Medications  morphine 4 MG/ML injection (not administered)  ondansetron (ZOFRAN) 4 MG/2ML injection (not administered)  iopamidol (ISOVUE-370) 76 % injection 100 mL (not administered)  HYDROmorphone (DILAUDID) injection 1 mg (1 mg Intravenous Given 12/16/15 0310)     Initial Impression / Assessment and Plan / ED Course  I have reviewed the triage vital signs and the nursing notes.  Pertinent labs & imaging results that were available during my care of the patient were reviewed by me and considered in my medical decision making (see chart for details).  Clinical Course   40 year old female who presents with 3 days of generalized abdominal pain and low back pain. Presentation is nontoxic in no acute distress with normal vital signs. She has a soft and nonsurgical abdomen, with diffuse tenderness. She is not pregnant. She has an unremarkable CBC, comp metabolic panel, lipase. Her urine is normal. CT abdomen pelvis visualized and reviewed with radiology. Overall no acute intra-abdominal process. No retroperitoneal process. She does have degenerating uterine fibroid, which I believe is the most likely etiology of her pain. She is given GYN follow up as needed. Supportive care discussed. Strict return and follow-up instructions reviewed. She expressed understanding of all discharge instructions and felt comfortable with the plan of care.   Final Clinical Impressions(s) / ED Diagnoses   Final diagnoses:  Generalized abdominal pain  Uterine leiomyoma, unspecified location   I personally performed the services described in this documentation, which was scribed in my presence. The recorded information has been reviewed and is accurate.   New Prescriptions New Prescriptions   No medications on file     Forde Dandy, MD 12/16/15 [phone]

## 2015-12-16 NOTE — MAU Note (Signed)
Went to the ER last night for abd pain. Was told she had fibroids. Tried to she an OBGYN today but due to insurance could not be seen. Pt reports having a lot of pain and was not given any pain medication last night.

## 2015-12-16 NOTE — Discharge Instructions (Signed)
You are given ob/gyn follow-up above.  Your fibroid is degenerating or breaking down in your uterus, and this is likely causing you pain.  Take anti-inflammatory medications for this pain and use heat pads.  Return for worsening symptoms, including fever, intractable vomiting, escalating pain or any other symptoms concerning to you.

## 2015-12-16 NOTE — MAU Provider Note (Signed)
History     CSN: [id number]  Arrival date and time: 12/16/15 2106   First Provider Initiated Contact with Patient 12/16/15 2203      Chief Complaint  Patient presents with  . Abdominal Pain   Gabriella Snyder is a 40 y.o. [id number] presenting with 4 day history lower abdominal pain. She had emergency Department evaluation yesterday including unremarkable CBC , CMP, lipase. UA was all negative. CT abdomen and pelvis showed no acute intra-abdominal process but multiple fibroids were present, largest 3.1x3.6x4.0, with "cystic degeneration."  She has had no previous similar episodes. No new sex partner.     Abdominal Pain  This is a new problem. The current episode started in the past 7 days. The onset quality is gradual. The problem occurs constantly. The problem has been waxing and waning. The pain is located in the LLQ, RLQ and suprapubic region. The pain is at a severity of 9/10. The quality of the pain is cramping and sharp ("Feels like back labor"). The abdominal pain radiates to the back (both groin areas and low back, sometimnes to upper legs). Pertinent negatives include no anorexia, constipation, diarrhea, dysuria, fever, frequency, headaches, myalgias, nausea or vomiting. Nothing aggravates the pain. The pain is relieved by nothing. Treatments tried: Tried percocet and Flexeril yesterday with some relief. ran out, no treatment today. The treatment provided mild relief. Prior diagnostic workup includes CT scan (Seen in ED yesterday ). Her past medical history is significant for abdominal surgery.   OB History  Gravida Para Term Preterm AB Living  8 5 5  0 2 5  SAB TAB Ectopic Multiple Live Births  1 0 0 0      # Outcome Date GA Lbr Len/2nd Weight Sex Delivery Anes PTL Lv  8 SAB           7 AB           6 Term      Vag-Spont     5 Term      Vag-Spont     4 Term      Vag-Spont     3 Term      Vag-Spont     2 Term      CS-Unspec     1 Gravida                Past Medical  History:  Diagnosis Date  . Anemia   . Anxiety   . Depression   . Heart murmur    "when I was younger; it closed up"  . History of blood transfusion 1993   "related to the C-section  . Migraine    "a couple times/yr" (02/25/2014)  . Muscle spasm   . Pyelonephritis 02/25/2014    Past Surgical History:  Procedure Laterality Date  . CESAREAN SECTION  1993  . DILATION AND CURETTAGE OF UTERUS  2003  . TUBAL LIGATION  2011  . TUMOR EXCISION Right ~ 2004   "off my back"    Family History  Problem Relation Age of Onset  . Diabetes Mother   . Hypertension Father     Social History  Substance Use Topics  . Smoking status: Current Every Day Smoker    Packs/day: 0.25    Years: 6.00    Types: Cigarettes  . Smokeless tobacco: Never Used  . Alcohol use Yes     Comment: 02/25/2014 "don't really drink; might have a drink q now and then"    Allergies: No Known Allergies  Prescriptions Prior to Admission  Medication Sig Dispense Refill Last Dose  . cyclobenzaprine (FLEXERIL) 5 MG tablet Take 1 tablet (5 mg total) by mouth 3 (three) times daily as needed for muscle spasms. 10 tablet 0 Past Week at Unknown time  . metroNIDAZOLE (FLAGYL) 500 MG tablet Take 1 tablet (500 mg total) by mouth 2 (two) times daily. 14 tablet 0   . oxyCODONE-acetaminophen (PERCOCET/ROXICET) 5-325 MG per tablet Take 1-2 tablets by mouth every 6 (six) hours as needed for severe pain. 8 tablet 0 Past Week at Unknown time  . Prenatal Vit-Fe Fumarate-FA (PRENATAL MULTIVITAMIN) TABS tablet Take 1 tablet by mouth daily at 12 noon.   Past Week at Unknown time    Review of Systems  Constitutional: Negative for fever.  Cardiovascular: Negative for chest pain.  Gastrointestinal: Positive for abdominal pain. Negative for anorexia, constipation, diarrhea, nausea and vomiting.  Genitourinary: Negative for dysuria and frequency.       Denies irritative vaginal discharge.  Musculoskeletal: Negative for myalgias.   Neurological: Negative for dizziness and headaches.   Physical Exam   Blood pressure 105/59, pulse 78, temperature 98.1 F (36.7 C), temperature source Oral, resp. rate 18, height 5\' 10"  (1.778 m), weight 102.1 kg (225 lb), last menstrual period 11/29/2015.  Physical Exam  Nursing note and vitals reviewed. Constitutional: She is oriented to person, place, and time. She appears well-developed and well-nourished. She appears distressed.  Terarful, in apparent pain  HENT:  Head: Normocephalic.  Eyes: Pupils are equal, round, and reactive to light.  Neck: Normal range of motion.  Cardiovascular: Normal rate.   Respiratory: Effort normal.  GI: There is tenderness. There is guarding. There is no rebound.  Genitourinary: Vagina normal. No vaginal discharge found.  Genitourinary Comments: Cx firm, moderate CMT, uterus tender, firm, nodular, 12 wk size   Musculoskeletal: Normal range of motion.  Neurological: She is alert and oriented to person, place, and time.  Skin: Skin is warm and dry.    MAU Course  Procedures  Results for orders placed or performed during the hospital encounter of 12/16/15 (from the past 24 hour(s))  Wet prep, genital     Status: Abnormal   Collection Time: 12/16/15 11:58 PM  Result Value Ref Range   Yeast Wet Prep HPF POC NONE SEEN NONE SEEN   Trich, Wet Prep NONE SEEN NONE SEEN   Clue Cells Wet Prep HPF POC PRESENT (A) NONE SEEN   WBC, Wet Prep HPF POC FEW (A) NONE SEEN   Sperm NONE SEEN   CBC with Differential     Status: Abnormal   Collection Time: 12/17/15 12:55 AM  Result Value Ref Range   WBC 9.4 4.0 - 10.5 K/uL   RBC 4.44 3.87 - 5.11 MIL/uL   Hemoglobin 9.5 (L) 12.0 - 15.0 g/dL   HCT 29.4 (L) 36.0 - 46.0 %   MCV 66.2 (L) 78.0 - 100.0 fL   MCH 21.4 (L) 26.0 - 34.0 pg   MCHC 32.3 30.0 - 36.0 g/dL   RDW 20.5 (H) 11.5 - 15.5 %   Platelets 227 150 - 400 K/uL   Neutrophils Relative % 56 %   Neutro Abs 5.3 1.7 - 7.7 K/uL   Lymphocytes Relative 38 %    Lymphs Abs 3.6 0.7 - 4.0 K/uL   Monocytes Relative 3 %   Monocytes Absolute 0.3 0.1 - 1.0 K/uL   Eosinophils Relative 3 %   Eosinophils Absolute 0.3 0.0 - 0.7 K/uL   Basophils Relative  0 %   Basophils Absolute 0.0 0.0 - 0.1 K/uL    WP, CBC pending. GC/CT sent Toradol 60mg  IM given> no improvement Will give Dilaudid 2mg  IV Jorje Guild NP assumed care at 0030  Pt reports pain improved, down to 6/10 from 9/10  Discussed results & tx plan with patient. Offered patient iron supplement, flagyl, & naproxen. Pt became upset that she wasn't getting stronger medication. Per review of [place] Controlled Substance database, pt receives percocet #90 monthly from Dr. Noah Delaine; last rx was 1 month ago. Continued to offer patient naproxen & f/u with [facility] for management of fibroids. Pt became increasingly agitated & states that she will just leave & go to [facility]. States that naproxen won't work & she needs something stronger. Informed pt of what I could do for her today including rx for iron, flagyl, & naproxen and f/u in [facility] Assessment and Plan   A: 1. Uterine leiomyoma, unspecified location   2. BV (bacterial vaginosis)   3. Generalized abdominal pain   4. Anemia, unspecified anemia type     P: Discharge home Rx iron supplement, flagyl, naproxen Msg sent to [facility] for f/u appt GC/CT pending Pt walked out without AVS

## 2015-12-16 NOTE — ED Notes (Signed)
Pt called out and states "The pain medicine has not helped at all" Pain still 8/10

## 2015-12-17 ENCOUNTER — Emergency Department (HOSPITAL_COMMUNITY)
Admission: EM | Admit: 2015-12-17 | Discharge: 2015-12-17 | Disposition: A | Discharge status: Home / Self Care | Payer: Medicaid Other | Source: Home / Self Care | Attending: Emergency Medicine | Admitting: Emergency Medicine

## 2015-12-17 ENCOUNTER — Encounter (HOSPITAL_COMMUNITY): Payer: Self-pay | Admitting: Emergency Medicine

## 2015-12-17 DIAGNOSIS — Z79891 Long term (current) use of opiate analgesic: Secondary | ICD-10-CM

## 2015-12-17 DIAGNOSIS — R1030 Lower abdominal pain, unspecified: Secondary | ICD-10-CM | POA: Insufficient documentation

## 2015-12-17 DIAGNOSIS — F17210 Nicotine dependence, cigarettes, uncomplicated: Secondary | ICD-10-CM

## 2015-12-17 DIAGNOSIS — Z791 Long term (current) use of non-steroidal anti-inflammatories (NSAID): Secondary | ICD-10-CM

## 2015-12-17 DIAGNOSIS — Z79899 Other long term (current) drug therapy: Secondary | ICD-10-CM | POA: Insufficient documentation

## 2015-12-17 LAB — WET PREP, GENITAL
SPERM: NONE SEEN
TRICH WET PREP: NONE SEEN
YEAST WET PREP: NONE SEEN

## 2015-12-17 LAB — CBC WITH DIFFERENTIAL/PLATELET
Basophils Absolute: 0 10*3/uL (ref 0.0–0.1)
Basophils Relative: 0 %
Eosinophils Absolute: 0.3 10*3/uL (ref 0.0–0.7)
Eosinophils Relative: 3 %
HEMATOCRIT: 29.4 % — AB (ref 36.0–46.0)
HEMOGLOBIN: 9.5 g/dL — AB (ref 12.0–15.0)
LYMPHS ABS: 3.6 10*3/uL (ref 0.7–4.0)
LYMPHS PCT: 38 %
MCH: 21.4 pg — AB (ref 26.0–34.0)
MCHC: 32.3 g/dL (ref 30.0–36.0)
MCV: 66.2 fL — AB (ref 78.0–100.0)
MONOS PCT: 3 %
Monocytes Absolute: 0.3 10*3/uL (ref 0.1–1.0)
NEUTROS ABS: 5.3 10*3/uL (ref 1.7–7.7)
NEUTROS PCT: 56 %
Platelets: 227 10*3/uL (ref 150–400)
RBC: 4.44 MIL/uL (ref 3.87–5.11)
RDW: 20.5 % — ABNORMAL HIGH (ref 11.5–15.5)
WBC: 9.4 10*3/uL (ref 4.0–10.5)

## 2015-12-17 LAB — GC/CHLAMYDIA PROBE AMP (~~LOC~~) NOT AT ARMC
Chlamydia: NEGATIVE
Neisseria Gonorrhea: NEGATIVE

## 2015-12-17 MED ORDER — NAPROXEN 375 MG PO TABS: 375.0000 mg | ORAL_TABLET | Freq: Two times a day (BID) | ORAL | 0 refills | Status: DC

## 2015-12-17 MED ORDER — HYDROMORPHONE HCL 1 MG/ML IJ SOLN
2.0000 mg | Freq: Once | INTRAMUSCULAR | Status: AC
  Administered 2015-12-17: 2 mg via INTRAMUSCULAR
  Filled 2015-12-17: qty 2

## 2015-12-17 MED ORDER — METRONIDAZOLE 500 MG PO TABS: 500.0000 mg | ORAL_TABLET | Freq: Two times a day (BID) | ORAL | 0 refills | Status: DC

## 2015-12-17 MED ORDER — FERROUS SULFATE 325 (65 FE) MG PO TABS: 325.0000 mg | ORAL_TABLET | Freq: Every day | ORAL | 0 refills | Status: DC

## 2015-12-17 MED ORDER — OXYCODONE-ACETAMINOPHEN 5-325 MG PO TABS
1.0000 | ORAL_TABLET | Freq: Once | ORAL | Status: AC
  Administered 2015-12-17: 1 via ORAL
  Filled 2015-12-17: qty 1

## 2015-12-17 MED ORDER — IOPAMIDOL (ISOVUE-300) INJECTION 61%
100.0000 mL | Freq: Once | INTRAVENOUS | Status: AC | PRN
  Administered 2015-12-16: 100 mL via INTRAVENOUS

## 2015-12-17 MED ORDER — HYDROMORPHONE HCL 1 MG/ML IJ SOLN: 2.0000 mg | Freq: Once | INTRAMUSCULAR | Status: DC

## 2015-12-17 NOTE — ED Provider Notes (Signed)
[facility] DEPT Provider Note   CSN: [id number] Arrival date & time: 12/17/15  0158  First Provider Contact:  First MD Initiated Contact with Patient 12/17/15 0250        History   Chief Complaint Chief Complaint  Patient presents with  . Abdominal Pain    HPI Gabriella Snyder is a 40 y.o. female.  Patient returns to the ED with persistent lower abdominal pain that started several days ago. No fever, nausea, vomiting, dysuria, vaginal discharge.  She was seen x 2 yesterday ([facility]) and diagnosed with fibroids. She reports nothing is controlling her pain. No new symptoms.    The history is provided by the patient. No language interpreter was used.  Abdominal Pain   Pertinent negatives include fever, vomiting, dysuria and myalgias.    Past Medical History:  Diagnosis Date  . Anemia   . Anxiety   . Depression   . Heart murmur    "when I was younger; it closed up"  . History of blood transfusion 1993   "related to the C-section  . Migraine    "a couple times/yr" (02/25/2014)  . Muscle spasm   . Pyelonephritis 02/25/2014    Patient Active Problem List   Diagnosis Date Noted  . BV (bacterial vaginosis) 02/27/2014  . Iron deficiency anemia 02/27/2014  . Pyelonephritis 02/25/2014  . Acute pyelonephritis 02/25/2014    Past Surgical History:  Procedure Laterality Date  . CESAREAN SECTION  1993  . DILATION AND CURETTAGE OF UTERUS  2003  . TUBAL LIGATION  2011  . TUMOR EXCISION Right ~ 2004   "off my back"    OB History    Gravida Para Term Preterm AB Living   8 5 5  0 2 5   SAB TAB Ectopic Multiple Live Births   1 0 0 0         Home Medications    Prior to Admission medications   Medication Sig Start Date End Date Taking? Authorizing Provider  acetaminophen (TYLENOL) 500 MG tablet Take 1,000 mg by mouth every 6 (six) hours as needed for mild pain.   Yes Historical Provider, MD  cyclobenzaprine (FLEXERIL) 5 MG tablet Take 1 tablet (5  mg total) by mouth 3 (three) times daily as needed for muscle spasms. 01/09/15  Yes Serita Grit, MD  ibuprofen (ADVIL,MOTRIN) 200 MG tablet Take 200 mg by mouth every 6 (six) hours as needed for moderate pain.   Yes Historical Provider, MD  naproxen sodium (ANAPROX) 220 MG tablet Take 220 mg by mouth 2 (two) times daily with a meal.   Yes Historical Provider, MD  oxyCODONE-acetaminophen (PERCOCET/ROXICET) 5-325 MG per tablet Take 1-2 tablets by mouth every 6 (six) hours as needed for severe pain. 01/09/15  Yes Serita Grit, MD  Prenatal Vit-Fe Fumarate-FA (PRENATAL MULTIVITAMIN) TABS tablet Take 1 tablet by mouth daily at 12 noon.   Yes Historical Provider, MD  ferrous sulfate 325 (65 FE) MG tablet Take 1 tablet (325 mg total) by mouth daily. 12/17/15   Jorje Guild, NP  metroNIDAZOLE (FLAGYL) 500 MG tablet Take 1 tablet (500 mg total) by mouth 2 (two) times daily. 12/17/15   Jorje Guild, NP  naproxen (NAPROSYN) 375 MG tablet Take 1 tablet (375 mg total) by mouth 2 (two) times daily. Patient not taking: Reported on 12/17/2015 12/17/15   Jorje Guild, NP    Family History Family History  Problem Relation Age of Onset  . Diabetes Mother   .  Hypertension Father     Social History Social History  Substance Use Topics  . Smoking status: Current Every Day Smoker    Packs/day: 0.25    Years: 6.00    Types: Cigarettes  . Smokeless tobacco: Never Used  . Alcohol use Yes     Comment: 02/25/2014 "don't really drink; might have a drink q now and then"     Allergies   Review of patient's allergies indicates no known allergies.   Review of Systems Review of Systems  Constitutional: Negative for chills and fever.  Respiratory: Negative.  Negative for shortness of breath.   Cardiovascular: Negative.  Negative for chest pain.  Gastrointestinal: Positive for abdominal pain. Negative for vomiting.  Genitourinary: Negative.  Negative for dysuria and vaginal discharge.  Musculoskeletal: Positive  for back pain (Pain radiates from lower abdomen to lower back. ). Negative for myalgias.  Neurological: Negative.      Physical Exam Updated Vital Signs BP 121/61 (BP Location: Left Arm)   Pulse 66   Temp 98.2 F (36.8 C) (Oral)   Resp 20   Ht 5\' 10"  (1.778 m)   Wt 102.5 kg   LMP 11/29/2015   SpO2 100%   BMI 32.43 kg/m   Physical Exam  Constitutional: She is oriented to person, place, and time. She appears well-developed and well-nourished.  Neck: Normal range of motion.  Pulmonary/Chest: Effort normal.  Abdominal: Normal appearance. She exhibits no distension. There is tenderness. There is no rigidity and no guarding.    Neurological: She is alert and oriented to person, place, and time.  Skin: Skin is warm and dry.     ED Treatments / Results  Labs (all labs ordered are listed, but only abnormal results are displayed) Labs Reviewed - No data to display  EKG  EKG Interpretation None       Radiology Ct Abdomen Pelvis W Contrast  Result Date: 12/16/2015 CLINICAL DATA:  Initial evaluation for acute abdominal cramping with low were back pain for 3 days. Pain radiates into bilateral lower extremity. Next a I EXAM: CT ABDOMEN AND PELVIS WITH CONTRAST TECHNIQUE: Multidetector CT imaging of the abdomen and pelvis was performed using the standard protocol following bolus administration of intravenous contrast. CONTRAST:  100 cc of Isovue-300. COMPARISON:  Prior CT from 02/25/2014. FINDINGS: Visualized lung bases are clear. Trace pericardial fluid noted.  No pleural effusion. Irregular hypodense lesion within the subcapsular right hepatic lobe measures approximately 2.8 x 1.6 cm, indeterminate, but most likely a benign hemangioma. This is similar to previous. Liver otherwise unremarkable. Gallbladder within normal limits. No biliary dilatation. Spleen, adrenal glands, and pancreas are within normal limits. Kidneys are equal in size with symmetric enhancement. No nephrolithiasis,  hydronephrosis, or focal enhancing renal mass. Stomach within normal limits. No evidence for bowel obstruction. Appendix is normal. No abnormal wall thickening, mucosal enhancement, or inflammatory fat stranding seen about the bowels. Bladder largely decompressed without definite abnormality. Mild circumferential bladder wall thickening likely related incomplete distension. Multiple fibroids present within the uterus. Largest of these is hypodense in appearance and measures 3.1 x 3.6 x 4.0 cm. This is increased in size relative to most recent examination. Ovaries within normal limits. Probable small degenerating corpus luteal cyst noted within the right ovary. Small volume free fluid within the right pelvic cul-de-sac, presumably physiologic. No free air. No pathologically enlarged lymph nodes identified. Normal intravascular enhancement seen throughout the intra-abdominal aorta and its branch vessels. No acute osseous abnormality. No worrisome lytic or blastic  osseous lesions. IMPRESSION: 1. No CT evidence for acute intra-abdominal or pelvic process. 2. Fibroid uterus, increased in size and undergone cystic degeneration relative to most recent examination from 2015. 3. Small volume free fluid within the pelvis, presumably physiologic. 4. 2.8 cm hypodense lesion within the subcapsular right hepatic lobe at the hepatic dome, indeterminate, but most likely a benign hemangioma. This is similar relative to prior examination. Electronically Signed   By: Jeannine Boga M.D.   On: 12/16/2015 03:15    Procedures Procedures (including critical care time)  Medications Ordered in ED Medications  oxyCODONE-acetaminophen (PERCOCET/ROXICET) 5-325 MG per tablet 1 tablet (1 tablet Oral Given 12/17/15 0323)     Initial Impression / Assessment and Plan / ED Course  I have reviewed the triage vital signs and the nursing notes.  Pertinent labs & imaging results that were available during my care of the patient were  reviewed by me and considered in my medical decision making (see chart for details).  Clinical Course    Patient returns to the ED with complaint of lower abdominal pain. Se[facility] at [facility] with evaluation that included: 8/9 CT abd/pel w/CM  Neg 8/8 UA   Neg 8/8 CBC   Hgb 10.0 (h/o anemia), WBC 11.9 8/9 Wet Prep  Clue Cells 8/9 GC/chlamydia  Pending  No change to abdominal pain.  [place] Controlled substances database show regular Rx for #90 Percocet 5/325 from one source [facility]) once per month, last filled 11/19/15.  No fever, vital signs normal. No repeat imaging or labs felt beneficial. Discussed with Dr. Billy Fischer.  Patient can be discharged home to follow up with PCP.   Final Clinical Impressions(s) / ED Diagnoses   Final diagnoses:  None  1. Abdominal pain 2. Uterine fibroid  New Prescriptions New Prescriptions   No medications on file     Charlann Lange, PA-C 12/17/15 [id number]    Gareth Morgan, MD 12/17/15 1843

## 2015-12-17 NOTE — Discharge Instructions (Signed)
Uterine Fibroids Uterine fibroids are tissue masses (tumors) that can develop in the womb (uterus). They are also called leiomyomas. This type of tumor is not cancerous (benign) and does not spread to other parts of the body outside of the pelvic area, which is between the hip bones. Occasionally, fibroids may develop in the fallopian tubes, in the cervix, or on the support structures (ligaments) that surround the uterus. You can have one or many fibroids. Fibroids can vary in size, weight, and where they grow in the uterus. Some can become quite large. Most fibroids do not require medical treatment. CAUSES A fibroid can develop when a single uterine cell keeps growing (replicating). Most cells in the human body have a control mechanism that keeps them from replicating without control. SIGNS AND SYMPTOMS Symptoms may include:   Heavy bleeding during your period.  Bleeding or spotting between periods.  Pelvic pain and pressure.  Bladder problems, such as needing to urinate more often (urinary frequency) or urgently.  Inability to reproduce offspring (infertility).  Miscarriages. DIAGNOSIS Uterine fibroids are diagnosed through a physical exam. Your health care provider may feel the lumpy tumors during a pelvic exam. Ultrasonography and an MRI may be done to determine the size, location, and number of fibroids. TREATMENT Treatment may include:  Watchful waiting. This involves getting the fibroid checked by your health care provider to see if it grows or shrinks. Follow your health care provider's recommendations for how often to have this checked.  Hormone medicines. These can be taken by mouth or given through an intrauterine device (IUD).  Surgery.  Removing the fibroids (myomectomy) or the uterus (hysterectomy).  Removing blood supply to the fibroids (uterine artery embolization). If fibroids interfere with your fertility and you want to become pregnant, your health care provider  may recommend having the fibroids removed.  HOME CARE INSTRUCTIONS  Keep all follow-up visits as directed by your health care provider. This is important.  Take medicines only as directed by your health care provider.  If you were prescribed a hormone treatment, take the hormone medicines exactly as directed.  Do not take aspirin, because it can cause bleeding.  Ask your health care provider about taking iron pills and increasing the amount of dark green, leafy vegetables in your diet. These actions can help to boost your blood iron levels, which may be affected by heavy menstrual bleeding.  Pay close attention to your period and tell your health care provider about any changes, such as:  Increased blood flow that requires you to use more pads or tampons than usual per month.  A change in the number of days that your period lasts per month.  A change in symptoms that are associated with your period, such as abdominal cramping or back pain. SEEK MEDICAL CARE IF:  You have pelvic pain, back pain, or abdominal cramps that cannot be controlled with medicines.  You have an increase in bleeding between and during periods.  You soak tampons or pads in a half hour or less.  You feel lightheaded, extra tired, or weak. SEEK IMMEDIATE MEDICAL CARE IF:  You faint.  You have a sudden increase in pelvic pain.   This information is not intended to replace advice given to you by your health care provider. Make sure you discuss any questions you have with your health care provider.   Document Released: 04/22/2000 Document Revised: 05/16/2014 Document Reviewed: 10/22/2013 Elsevier Interactive Patient Education 2016 Elsevier Inc.   Anemia, Nonspecific Anemia is a  condition in which the concentration of red blood cells or hemoglobin in the blood is below normal. Hemoglobin is a substance in red blood cells that carries oxygen to the tissues of the body. Anemia results in not enough oxygen  reaching these tissues.  CAUSES  Common causes of anemia include:   Excessive bleeding. Bleeding may be internal or external. This includes excessive bleeding from periods (in women) or from the intestine.   Poor nutrition.   Chronic kidney, thyroid, and liver disease.  Bone marrow disorders that decrease red blood cell production.  Cancer and treatments for cancer.  HIV, AIDS, and their treatments.  Spleen problems that increase red blood cell destruction.  Blood disorders.  Excess destruction of red blood cells due to infection, medicines, and autoimmune disorders. SIGNS AND SYMPTOMS   Minor weakness.   Dizziness.   Headache.  Palpitations.   Shortness of breath, especially with exercise.   Paleness.  Cold sensitivity.  Indigestion.  Nausea.  Difficulty sleeping.  Difficulty concentrating. Symptoms may occur suddenly or they may develop slowly.  DIAGNOSIS  Additional blood tests are often needed. These help your health care provider determine the best treatment. Your health care provider will check your stool for blood and look for other causes of blood loss.  TREATMENT  Treatment varies depending on the cause of the anemia. Treatment can include:   Supplements of iron, vitamin 123456, or folic acid.   Hormone medicines.   A blood transfusion. This may be needed if blood loss is severe.   Hospitalization. This may be needed if there is significant continual blood loss.   Dietary changes.  Spleen removal. HOME CARE INSTRUCTIONS Keep all follow-up appointments. It often takes many weeks to correct anemia, and having your health care provider check on your condition and your response to treatment is very important. SEEK IMMEDIATE MEDICAL CARE IF:   You develop extreme weakness, shortness of breath, or chest pain.   You become dizzy or have trouble concentrating.  You develop heavy vaginal bleeding.   You develop a rash.   You have  bloody or black, tarry stools.   You faint.   You vomit up blood.   You vomit repeatedly.   You have abdominal pain.  You have a fever or persistent symptoms for more than 2-3 days.   You have a fever and your symptoms suddenly get worse.   You are dehydrated.  MAKE SURE YOU:  Understand these instructions.  Will watch your condition.  Will get help right away if you are not doing well or get worse.   This information is not intended to replace advice given to you by your health care provider. Make sure you discuss any questions you have with your health care provider.   Document Released: 06/02/2004 Document Revised: 12/26/2012 Document Reviewed: 10/19/2012 Elsevier Interactive Patient Education [employer].

## 2015-12-17 NOTE — ED Triage Notes (Signed)
Pt has had abdominal pain since Sunday.  Pt was seen at [facility] ED last night, given pain meds and released. Pt went to [facility] on Wednesday and was given pain meds, and given a prescription for naproxen and iron and released.  Pt states she is still in pain and has gotten very little relief from the previous treatments.

## 2015-12-24 ENCOUNTER — Other Ambulatory Visit: Payer: Self-pay | Admitting: Family Medicine

## 2015-12-24 ENCOUNTER — Ambulatory Visit (HOSPITAL_BASED_OUTPATIENT_CLINIC_OR_DEPARTMENT_OTHER)
Admission: RE | Admit: 2015-12-24 | Discharge: 2015-12-24 | Disposition: A | Discharge status: Home / Self Care | Payer: Medicaid Other | Source: Ambulatory Visit | Attending: Family Medicine | Admitting: Family Medicine

## 2015-12-24 ENCOUNTER — Encounter: Payer: Medicaid Other | Admitting: Family Medicine

## 2015-12-24 DIAGNOSIS — D259 Leiomyoma of uterus, unspecified: Secondary | ICD-10-CM | POA: Diagnosis not present

## 2015-12-30 ENCOUNTER — Ambulatory Visit (INDEPENDENT_AMBULATORY_CARE_PROVIDER_SITE_OTHER): Payer: Medicaid Other | Admitting: Family Medicine

## 2015-12-30 ENCOUNTER — Encounter: Payer: Self-pay | Admitting: Family Medicine

## 2015-12-30 VITALS — BP 122/66 | HR 69 | Ht 70.0 in | Wt 225.0 lb

## 2015-12-30 DIAGNOSIS — D251 Intramural leiomyoma of uterus: Secondary | ICD-10-CM

## 2015-12-30 NOTE — Progress Notes (Signed)
   Subjective:    Patient ID: Gabriella Snyder, female    DOB: 1975-09-20, 40 y.o.   MRN: [id number]  HPI Patient seen for 1 month of worsening pelvic pain that has been fairly constant at the beginning and now intermittent. Patient has been on oxycodone 20 mg every 6 hours, but has been cutting the pills in half and interspersing them with ibuprofen 800 mg twice a day. Her pain is improving a little bit. She was seen in the [facility]U on 8/9 and was found to have a degenerating fibroid on CT scan - fibroid measures 4.0 x 3.6 x 3.1 cm. She denies bleeding, vaginal discharge.  Not currently sexually active.  Digital history includes one prior C-section with BTL.  I have reviewed the patients past medical, family, and social history.  I have reviewed the patient's medication list and allergies.  Review of Systems     Objective:   Physical Exam  Constitutional: She appears well-developed and well-nourished.  HENT:  Head: Normocephalic and atraumatic.  Cardiovascular: Normal rate, regular rhythm and normal heart sounds.   Pulmonary/Chest: Effort normal and breath sounds normal. No respiratory distress. She has no wheezes. She has no rales. She exhibits no tenderness.  Abdominal: Soft. She exhibits no distension. There is tenderness (lower pelvic). There is no rebound and no guarding.  Genitourinary: There is no rash, tenderness, lesion or injury on the right labia. There is no rash, tenderness, lesion or injury on the left labia. Uterus is enlarged (6 week) and tender. Uterus is not deviated and not fixed. Cervix exhibits no motion tenderness, no discharge and no friability. Right adnexum displays no mass, no tenderness and no fullness. Left adnexum displays no mass, no tenderness and no fullness. No erythema, tenderness or bleeding in the vagina. No foreign body in the vagina. No signs of injury around the vagina. No vaginal discharge found.  Skin: Skin is warm and dry. No rash noted. No erythema. No  pallor.  Psychiatric: She has a normal mood and affect.      Assessment & Plan:  1. Intramural leiomyoma of uterus Discussed options of hysterectomy vs embolization.  Patient prefers hysterectomy.  Pain fairly well controlled with current medications.  F/u with Gyn surgeon for hysterectomy consult.

## 2016-01-01 ENCOUNTER — Encounter: Payer: Medicaid Other | Admitting: Obstetrics & Gynecology

## 2016-01-18 ENCOUNTER — Ambulatory Visit (INDEPENDENT_AMBULATORY_CARE_PROVIDER_SITE_OTHER): Payer: Medicaid Other | Admitting: Obstetrics & Gynecology

## 2016-01-18 ENCOUNTER — Encounter: Payer: Self-pay | Admitting: Obstetrics & Gynecology

## 2016-01-18 VITALS — BP 129/66 | HR 89 | Ht 70.0 in | Wt 225.0 lb

## 2016-01-18 DIAGNOSIS — R102 Pelvic and perineal pain: Secondary | ICD-10-CM

## 2016-01-18 DIAGNOSIS — D251 Intramural leiomyoma of uterus: Secondary | ICD-10-CM

## 2016-01-18 NOTE — Patient Instructions (Signed)
Return to clinic for any scheduled appointments or for any gynecologic concerns as needed.   

## 2016-01-18 NOTE — Progress Notes (Signed)
GYNECOLOGY VISIT NOTE  History:  40 y.o. [id number] here today for follow up discussion about possible surgical management/hysterectomy for her uterine fibroid and pelvic pain. Had an episode of excruciating pain about one month ago, ultrasound showed 4 cm fibroid, 2 cm physiologic cyst. Pain is much improved now, still feels a little pain. Accompanied by her female significant other. Pain is now mild, occurs occasionally. She is concerned about recurrence of the excruciating pain she had last month.  She denies any abnormal vaginal discharge, bleeding, pelvic pain or other concerns.   Past Medical History:  Diagnosis Date  . Anemia   . Anxiety   . Depression   . Heart murmur    "when I was younger; it closed up"  . History of blood transfusion 1993   "related to the C-section  . Migraine    "a couple times/yr" (02/25/2014)  . Muscle spasm   . Pyelonephritis 02/25/2014    Past Surgical History:  Procedure Laterality Date  . CESAREAN SECTION  1993  . DILATION AND CURETTAGE OF UTERUS  2003  . TUBAL LIGATION  2011  . TUMOR EXCISION Right ~ 2004   "off my back"    The following portions of the patient's history were reviewed and updated as appropriate: allergies, current medications, past family history, past medical history, past social history, past surgical history and problem list.    Review of Systems:  Pertinent items noted in HPI and remainder of comprehensive ROS otherwise negative.  Objective:  Physical Exam BP 129/66 (BP Location: Left Arm, Patient Position: Sitting)   Pulse 89   Ht 5\' 10"  (1.778 m)   Wt 225 lb (102.1 kg)   LMP 12/26/2015 (Exact Date)   BMI 32.28 kg/m  CONSTITUTIONAL: Well-developed, well-nourished female in no acute distress.  HENT:  Normocephalic, atraumatic. External right and left ear normal. Oropharynx is clear and moist EYES: Conjunctivae and EOM are normal. Pupils are equal, round, and reactive to light. No scleral icterus.  NECK: Normal  range of motion, supple, no masses SKIN: Skin is warm and dry. No rash noted. Not diaphoretic. No erythema. No pallor. NEUROLOGIC: Alert and oriented to person, place, and time. Normal reflexes, muscle tone coordination. No cranial nerve deficit noted. PSYCHIATRIC: Normal mood and affect. Normal behavior. Normal judgment and thought content. CARDIOVASCULAR: Normal heart rate noted RESPIRATORY: Effort and breath sounds normal, no problems with respiration noted ABDOMEN: Soft, no distention noted.   PELVIC: Deferred MUSCULOSKELETAL: Normal range of motion. No edema noted.  Labs and Imaging US Transvaginal Non-ob  Result Date: 12/24/2015 CLINICAL DATA:  Pelvic and low back pain for 2 weeks. Fibroids. LMP 11/29/2015. EXAM: TRANSABDOMINAL AND TRANSVAGINAL ULTRASOUND OF PELVIS TECHNIQUE: Both transabdominal and transvaginal ultrasound examinations of the pelvis were performed. Transabdominal technique was performed for global imaging of the pelvis including uterus, ovaries, adnexal regions, and pelvic cul-de-sac. It was necessary to proceed with endovaginal exam following the transabdominal exam to visualize the endometrial thickness and ovaries. COMPARISON:  CT on 12/16/2015 and ultrasound on 11/01/2012 FINDINGS: Uterus Measurements: 9.9 x 8.0 x 7.2 cm. An intramural fibroid is seen in the left anterior corpus measuring 3.9 cm in maximum diameter. This is increased in size from approximately 1 cm on previous ultrasound in 2014. Endometrium Thickness: 12 mm.  No focal abnormality visualized. Right ovary Measurements: 2.8 x 1.9 x 2.3 cm. 1.7 cm corpus luteum noted. No other cystic or solid masses identified. Left ovary Measurements: Not directly visualized, however no adnexal mass identified.  Other findings No abnormal free fluid. IMPRESSION: 3.9 cm fibroid in left anterior corpus, which has increased in size since 2014 exam. Small right ovarian corpus luteum incidentally noted. No adnexal mass identified.  Electronically Signed   By: Earle Gell M.D.   On: 12/24/2015 15:00   US Pelvis Complete  Result Date: 12/24/2015 CLINICAL DATA:  Pelvic and low back pain for 2 weeks. Fibroids. LMP 11/29/2015. EXAM: TRANSABDOMINAL AND TRANSVAGINAL ULTRASOUND OF PELVIS TECHNIQUE: Both transabdominal and transvaginal ultrasound examinations of the pelvis were performed. Transabdominal technique was performed for global imaging of the pelvis including uterus, ovaries, adnexal regions, and pelvic cul-de-sac. It was necessary to proceed with endovaginal exam following the transabdominal exam to visualize the endometrial thickness and ovaries. COMPARISON:  CT on 12/16/2015 and ultrasound on 11/01/2012 FINDINGS: Uterus Measurements: 9.9 x 8.0 x 7.2 cm. An intramural fibroid is seen in the left anterior corpus measuring 3.9 cm in maximum diameter. This is increased in size from approximately 1 cm on previous ultrasound in 2014. Endometrium Thickness: 12 mm.  No focal abnormality visualized. Right ovary Measurements: 2.8 x 1.9 x 2.3 cm. 1.7 cm corpus luteum noted. No other cystic or solid masses identified. Left ovary Measurements: Not directly visualized, however no adnexal mass identified. Other findings No abnormal free fluid. IMPRESSION: 3.9 cm fibroid in left anterior corpus, which has increased in size since 2014 exam. Small right ovarian corpus luteum incidentally noted. No adnexal mass identified. Electronically Signed   By: Earle Gell M.D.   On: 12/24/2015 15:00    Assessment & Plan:  1. Intramural leiomyoma of uterus 2. Pelvic pain in female Patient assured that since her pain is resolving, likely did not have anything to do with her  4cm fibroid. She likely had a physiologic cyst rupture; there was small free fluid noted on CT scan on 12/16/15 during her ED visit.  She was informed that pain due to physiologic cyst rupture could be excruciating but does resolve over time.  This is her first episode of such pain since  puberty, another episode of same pain magnitude is unlikely. However, she was told that in patients with recurrent symptomatic cysts, hormonal ovulation suppression is needed but patient is not a good candidate for this given her smoking history. She was told that a hysterectomy at this point, will spare her ovaries, and may not address the etiology of her pain. Advised expectant management for now; will re-discuss need for surgery if symptoms recur or if other concerning symptoms develop.  Patient verbalized understanding of plan, will take pain medications as needed and call back for any worsening symptoms.   Return if symptoms worsen or fail to improve.   Total face-to-face time with patient: 25 minutes. Over 50% of encounter was spent on counseling and coordination of care.   Verita Schneiders, MD, [facility] Attending [place], [facility] for [employer], [place]

## 2016-04-16 ENCOUNTER — Emergency Department (HOSPITAL_COMMUNITY): Payer: Medicaid Other

## 2016-04-16 ENCOUNTER — Emergency Department (HOSPITAL_COMMUNITY)
Admission: EM | Admit: 2016-04-16 | Discharge: 2016-04-16 | Disposition: A | Discharge status: Home / Self Care | Payer: Medicaid Other | Attending: Emergency Medicine | Admitting: Emergency Medicine

## 2016-04-16 ENCOUNTER — Encounter (HOSPITAL_COMMUNITY): Payer: Self-pay | Admitting: *Deleted

## 2016-04-16 DIAGNOSIS — F17210 Nicotine dependence, cigarettes, uncomplicated: Secondary | ICD-10-CM | POA: Insufficient documentation

## 2016-04-16 DIAGNOSIS — Y92410 Unspecified street and highway as the place of occurrence of the external cause: Secondary | ICD-10-CM | POA: Diagnosis not present

## 2016-04-16 DIAGNOSIS — R0789 Other chest pain: Secondary | ICD-10-CM | POA: Diagnosis not present

## 2016-04-16 DIAGNOSIS — Y939 Activity, unspecified: Secondary | ICD-10-CM | POA: Diagnosis not present

## 2016-04-16 DIAGNOSIS — M546 Pain in thoracic spine: Secondary | ICD-10-CM | POA: Diagnosis not present

## 2016-04-16 DIAGNOSIS — Y999 Unspecified external cause status: Secondary | ICD-10-CM | POA: Diagnosis not present

## 2016-04-16 MED ORDER — CYCLOBENZAPRINE HCL 5 MG PO TABS: 5.0000 mg | ORAL_TABLET | Freq: Three times a day (TID) | ORAL | 0 refills | Status: DC | PRN

## 2016-04-16 MED ORDER — NAPROXEN 375 MG PO TABS: 375.0000 mg | ORAL_TABLET | Freq: Two times a day (BID) | ORAL | 0 refills | Status: DC
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
## 2016-06-14 ENCOUNTER — Ambulatory Visit: Payer: Medicaid Other | Attending: Sports Medicine

## 2016-06-14 DIAGNOSIS — M25511 Pain in right shoulder: Secondary | ICD-10-CM | POA: Insufficient documentation

## 2016-06-14 DIAGNOSIS — X58XXXD Exposure to other specified factors, subsequent encounter: Secondary | ICD-10-CM | POA: Diagnosis not present

## 2016-06-14 DIAGNOSIS — S161XXD Strain of muscle, fascia and tendon at neck level, subsequent encounter: Secondary | ICD-10-CM | POA: Insufficient documentation

## 2016-06-14 DIAGNOSIS — M6281 Muscle weakness (generalized): Secondary | ICD-10-CM | POA: Diagnosis present

## 2016-06-14 DIAGNOSIS — G8929 Other chronic pain: Secondary | ICD-10-CM | POA: Diagnosis present

## 2016-06-14 NOTE — Patient Instructions (Signed)
Issued from cabinet ROM exercises with cane and self ROM for overhead reaching. Also band exercises  rockwood and unattached and issued yellow/red/green band . She will start with 3-5 reps then work up to 20-25 reps and manage tension and band choice as she progresses. Stretch 2-4x/day band 1x/day
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
## 2017-01-11 ENCOUNTER — Other Ambulatory Visit (HOSPITAL_COMMUNITY)
Admission: RE | Admit: 2017-01-11 | Discharge: 2017-01-11 | Disposition: A | Discharge status: Home / Self Care | Payer: Medicaid Other | Source: Ambulatory Visit | Attending: Obstetrics & Gynecology | Admitting: Obstetrics & Gynecology

## 2017-01-11 ENCOUNTER — Encounter: Payer: Self-pay | Admitting: Obstetrics & Gynecology

## 2017-01-11 ENCOUNTER — Other Ambulatory Visit (HOSPITAL_COMMUNITY): Payer: Self-pay | Admitting: Obstetrics & Gynecology

## 2017-01-11 ENCOUNTER — Ambulatory Visit (INDEPENDENT_AMBULATORY_CARE_PROVIDER_SITE_OTHER): Payer: Medicaid Other | Admitting: Obstetrics & Gynecology

## 2017-01-11 VITALS — BP 137/73 | HR 78 | Ht 70.5 in | Wt 210.0 lb

## 2017-01-11 DIAGNOSIS — Z0000 Encounter for general adult medical examination without abnormal findings: Secondary | ICD-10-CM

## 2017-01-11 DIAGNOSIS — N938 Other specified abnormal uterine and vaginal bleeding: Secondary | ICD-10-CM

## 2017-01-11 DIAGNOSIS — Z01419 Encounter for gynecological examination (general) (routine) without abnormal findings: Secondary | ICD-10-CM

## 2017-01-11 NOTE — Addendum Note (Signed)
Addended by: Phill Myron on: 01/11/2017 02:18 PM   Modules accepted: Orders

## 2017-01-11 NOTE — Progress Notes (Signed)
Subjective:    Gabriella Snyder is a 41 y.o. S AA P5 (25, 44, 81, 83, and 79 yo kids) female who presents for an annual exam. She reports that basically every period she has leads her to be beridden due to the cramping and pain. Previously she had periods "like clockwork", but now she has been bleeding every 2 weeks, for about the last 2 months The patient is sexually active. GYN screening history: last pap: was normal. The patient wears seatbelts: yes. The patient participates in regular exercise: yes. Has the patient ever been transfused or tattooed?: yes. The patient reports that there is not domestic violence in her life.   Menstrual History: OB History    Gravida Para Term Preterm AB Living   8 5 5  0 2 5   SAB TAB Ectopic Multiple Live Births   1 0 0 0        Menarche age: 34 No LMP recorded.    The following portions of the patient's history were reviewed and updated as appropriate: allergies, current medications, past family history, past medical history, past social history, past surgical history and problem list.  Review of Systems Pertinent items are noted in HPI.   Monogamous for about 5 months Unemployed currently, filed for disability  FH- + cervical cancer in mom's sister. + colon cancer in PGM, + lymphoma/leukemia/pancreas/lung cancer No gyn cancer Denies dyspareunia S/P BTL Chronic constipation, using Miralax   Objective:    BP 137/73 (BP Location: Left Arm)   Pulse 78   Ht 5' 10.5" (1.791 m)   Wt 210 lb (95.3 kg)   BMI 29.71 kg/m   General Appearance:    Alert, cooperative, no distress, appears stated age  Head:    Normocephalic, without obvious abnormality, atraumatic  Eyes:    PERRL, conjunctiva/corneas clear, EOM's intact, fundi    benign, both eyes  Ears:    Normal TM's and external ear canals, both ears  Nose:   Nares normal, septum midline, mucosa normal, no drainage    or sinus tenderness  Throat:   Lips, mucosa, and tongue normal; teeth and gums  normal  Neck:   Supple, symmetrical, trachea midline, no adenopathy;    thyroid:  no enlargement/tenderness/nodules; no carotid   bruit or JVD  Back:     Symmetric, no curvature, ROM normal, no CVA tenderness  Lungs:     Clear to auscultation bilaterally, respirations unlabored  Chest Wall:    No tenderness or deformity   Heart:    Regular rate and rhythm, S1 and S2 normal, no murmur, rub   or gallop  Breast Exam:    No tenderness, masses, or nipple abnormality  Abdomen:     Soft, non-tender, bowel sounds active all four quadrants,    no masses, no organomegaly  Genitalia:    Normal female without lesion, discharge or tenderness, 6 week size uterus, some mobility, no palpable adnexal masses or tenderness     Extremities:   Extremities normal, atraumatic, no cyanosis or edema  Pulses:   2+ and symmetric all extremities  Skin:   Skin color, texture, turgor normal, no rashes or lesions  Lymph nodes:   Cervical, supraclavicular, and axillary nodes normal  Neurologic:   CNII-XII intact, normal strength, sensation and reflexes    throughout  .    Assessment:    Healthy female exam.   DUB Severe dysmenorrhea   Plan:     Thin prep Pap smear. with cotesting Gyn u/s  CBC, TSH Cerivcal cultures STI testing due to new partner

## 2017-01-12 LAB — CBC
Hematocrit: 31.7 % — ABNORMAL LOW (ref 34.0–46.6)
Hemoglobin: 9.8 g/dL — ABNORMAL LOW (ref 11.1–15.9)
MCH: 21.2 pg — ABNORMAL LOW (ref 26.6–33.0)
MCHC: 30.9 g/dL — ABNORMAL LOW (ref 31.5–35.7)
MCV: 69 fL — AB (ref 79–97)
PLATELETS: 286 10*3/uL (ref 150–379)
RBC: 4.62 x10E6/uL (ref 3.77–5.28)
RDW: 23.7 % — AB (ref 12.3–15.4)
WBC: 8.1 10*3/uL (ref 3.4–10.8)

## 2017-01-12 LAB — HIV ANTIBODY (ROUTINE TESTING W REFLEX): HIV SCREEN 4TH GENERATION: NONREACTIVE

## 2017-01-12 LAB — RPR: RPR: NONREACTIVE

## 2017-01-12 LAB — HEPATITIS B SURFACE ANTIGEN: Hepatitis B Surface Ag: NEGATIVE

## 2017-01-12 LAB — HEPATITIS C ANTIBODY: Hep C Virus Ab: <0.1 s/co ratio (ref 0.0–0.9)

## 2017-01-12 LAB — TSH: TSH: 1.2 u[IU]/mL (ref 0.450–4.500)

## 2017-01-13 LAB — CYTOLOGY - PAP
BACTERIAL VAGINITIS: POSITIVE — AB
Candida vaginitis: NEGATIVE
Chlamydia: NEGATIVE
Diagnosis: NEGATIVE
Neisseria Gonorrhea: NEGATIVE
TRICH (WINDOWPATH): NEGATIVE

## 2017-01-14 ENCOUNTER — Ambulatory Visit (HOSPITAL_BASED_OUTPATIENT_CLINIC_OR_DEPARTMENT_OTHER)
Admission: RE | Admit: 2017-01-14 | Discharge: 2017-01-14 | Disposition: A | Discharge status: Home / Self Care | Payer: Medicaid Other | Source: Ambulatory Visit | Attending: Obstetrics & Gynecology | Admitting: Obstetrics & Gynecology

## 2017-01-14 DIAGNOSIS — N938 Other specified abnormal uterine and vaginal bleeding: Secondary | ICD-10-CM

## 2017-01-14 DIAGNOSIS — D259 Leiomyoma of uterus, unspecified: Secondary | ICD-10-CM | POA: Insufficient documentation

## 2017-02-17 ENCOUNTER — Ambulatory Visit (INDEPENDENT_AMBULATORY_CARE_PROVIDER_SITE_OTHER): Payer: Medicaid Other | Admitting: Obstetrics & Gynecology

## 2017-02-17 ENCOUNTER — Other Ambulatory Visit (HOSPITAL_COMMUNITY)
Admission: RE | Admit: 2017-02-17 | Discharge: 2017-02-17 | Disposition: A | Discharge status: Home / Self Care | Payer: Medicaid Other | Source: Ambulatory Visit | Attending: Obstetrics & Gynecology | Admitting: Obstetrics & Gynecology

## 2017-02-17 ENCOUNTER — Encounter: Payer: Self-pay | Admitting: Obstetrics & Gynecology

## 2017-02-17 VITALS — BP 117/69 | HR 66 | Ht 70.5 in | Wt 207.0 lb

## 2017-02-17 DIAGNOSIS — N938 Other specified abnormal uterine and vaginal bleeding: Secondary | ICD-10-CM | POA: Insufficient documentation

## 2017-02-17 DIAGNOSIS — Z3202 Encounter for pregnancy test, result negative: Secondary | ICD-10-CM

## 2017-02-17 DIAGNOSIS — B373 Candidiasis of vulva and vagina: Secondary | ICD-10-CM | POA: Insufficient documentation

## 2017-02-17 DIAGNOSIS — B3731 Acute candidiasis of vulva and vagina: Secondary | ICD-10-CM | POA: Insufficient documentation

## 2017-02-17 DIAGNOSIS — Z01812 Encounter for preprocedural laboratory examination: Secondary | ICD-10-CM

## 2017-02-17 LAB — POCT URINE PREGNANCY: Preg Test, Ur: NEGATIVE

## 2017-02-17 MED ORDER — METRONIDAZOLE 500 MG PO TABS: 500.0000 mg | ORAL_TABLET | Freq: Two times a day (BID) | ORAL | 0 refills | Status: DC

## 2017-02-17 MED ORDER — FLUCONAZOLE 150 MG PO TABS: 150.0000 mg | ORAL_TABLET | Freq: Once | ORAL | 3 refills | Status: AC

## 2017-02-17 NOTE — Progress Notes (Signed)
   Subjective:    Patient ID: Gabriella Snyder, female    DOB: 06/27/1975, 41 y.o.   MRN: [id number]  HPI   41 yo P5 here for a EMBX. She has DUB and her u/s showed a fibroid as well as a 13 mm endometrial lining.   Review of Systems Her pap was normal but had yeast and BV.    Objective:   Physical Exam Breathing, conversing, and ambulating normally Well nourished, well hydrated Black female, no apparent distress  UPT negative, consent signed, time out done Cervix prepped with betadine and grasped with a single tooth tenaculum Uterus sounded to 9 cm Pipelle used for 2 passes with a moderate amount of tissue obtained. She tolerated the procedure well.    Assessment & Plan:  Yeast and BV- treat appropriately DUB- await pathology Discuss treatment options at next visit

## 2017-02-17 NOTE — Addendum Note (Signed)
Addended by: Phill Myron on: 02/17/2017 10:03 AM   Modules accepted: Orders

## 2017-03-13 ENCOUNTER — Ambulatory Visit: Payer: Medicaid Other | Admitting: Obstetrics & Gynecology

## 2017-12-13 ENCOUNTER — Emergency Department (HOSPITAL_COMMUNITY): Payer: Medicaid Other

## 2017-12-13 ENCOUNTER — Inpatient Hospital Stay (HOSPITAL_COMMUNITY)
Admission: AD | Admit: 2017-12-13 | Discharge: 2017-12-13 | Discharge status: Against Medical Advice | Payer: Medicaid Other | Source: Ambulatory Visit | Attending: Obstetrics & Gynecology | Admitting: Obstetrics & Gynecology

## 2017-12-13 ENCOUNTER — Encounter (HOSPITAL_COMMUNITY): Payer: Self-pay

## 2017-12-13 ENCOUNTER — Other Ambulatory Visit: Payer: Self-pay

## 2017-12-13 ENCOUNTER — Emergency Department (HOSPITAL_COMMUNITY)
Admission: EM | Admit: 2017-12-13 | Discharge: 2017-12-13 | Disposition: A | Discharge status: Home / Self Care | Payer: Medicaid Other | Attending: Emergency Medicine | Admitting: Emergency Medicine

## 2017-12-13 DIAGNOSIS — R102 Pelvic and perineal pain: Secondary | ICD-10-CM | POA: Diagnosis present

## 2017-12-13 DIAGNOSIS — A599 Trichomoniasis, unspecified: Secondary | ICD-10-CM | POA: Diagnosis not present

## 2017-12-13 DIAGNOSIS — N739 Female pelvic inflammatory disease, unspecified: Secondary | ICD-10-CM | POA: Diagnosis not present

## 2017-12-13 DIAGNOSIS — Z202 Contact with and (suspected) exposure to infections with a predominantly sexual mode of transmission: Secondary | ICD-10-CM | POA: Diagnosis not present

## 2017-12-13 DIAGNOSIS — N760 Acute vaginitis: Secondary | ICD-10-CM | POA: Diagnosis not present

## 2017-12-13 DIAGNOSIS — B9689 Other specified bacterial agents as the cause of diseases classified elsewhere: Secondary | ICD-10-CM

## 2017-12-13 DIAGNOSIS — N939 Abnormal uterine and vaginal bleeding, unspecified: Secondary | ICD-10-CM | POA: Diagnosis not present

## 2017-12-13 DIAGNOSIS — N730 Acute parametritis and pelvic cellulitis: Secondary | ICD-10-CM

## 2017-12-13 LAB — COMPREHENSIVE METABOLIC PANEL
ALT: 9 U/L (ref 0–44)
AST: 18 U/L (ref 15–41)
Albumin: 4.2 g/dL (ref 3.5–5.0)
Alkaline Phosphatase: 49 U/L (ref 38–126)
Anion gap: 8 (ref 5–15)
BUN: 11 mg/dL (ref 6–20)
CHLORIDE: 107 mmol/L (ref 98–111)
CO2: 24 mmol/L (ref 22–32)
CREATININE: 1.1 mg/dL — AB (ref 0.44–1.00)
Calcium: 9.3 mg/dL (ref 8.9–10.3)
GFR calc Af Amer: >60 mL/min (ref >60)
Glucose, Bld: 77 mg/dL (ref 70–99)
Potassium: 3.8 mmol/L (ref 3.5–5.1)
Sodium: 139 mmol/L (ref 135–145)
Total Bilirubin: 0.7 mg/dL (ref 0.3–1.2)
Total Protein: 8.2 g/dL — ABNORMAL HIGH (ref 6.5–8.1)

## 2017-12-13 LAB — CBC WITH DIFFERENTIAL/PLATELET
Basophils Absolute: 0.1 10*3/uL (ref 0.0–0.1)
Basophils Relative: 0 %
EOS ABS: 0.2 10*3/uL (ref 0.0–0.7)
Eosinophils Relative: 2 %
HEMATOCRIT: 34.3 % — AB (ref 36.0–46.0)
HEMOGLOBIN: 10.8 g/dL — AB (ref 12.0–15.0)
LYMPHS ABS: 5.5 10*3/uL — AB (ref 0.7–4.0)
LYMPHS PCT: 44 %
MCH: 21.6 pg — ABNORMAL LOW (ref 26.0–34.0)
MCHC: 31.5 g/dL (ref 30.0–36.0)
MCV: 68.5 fL — AB (ref 78.0–100.0)
Monocytes Absolute: 0.8 10*3/uL (ref 0.1–1.0)
Monocytes Relative: 6 %
NEUTROS PCT: 48 %
Neutro Abs: 6.1 10*3/uL (ref 1.7–7.7)
PLATELETS: 289 10*3/uL (ref 150–400)
RBC: 5.01 MIL/uL (ref 3.87–5.11)
RDW: 20.9 % — ABNORMAL HIGH (ref 11.5–15.5)
WBC: 12.7 10*3/uL — AB (ref 4.0–10.5)

## 2017-12-13 LAB — URINALYSIS, ROUTINE W REFLEX MICROSCOPIC
BACTERIA UA: NONE SEEN
BILIRUBIN URINE: NEGATIVE
GLUCOSE, UA: NEGATIVE mg/dL
Ketones, ur: NEGATIVE mg/dL
Nitrite: NEGATIVE
Protein, ur: 30 mg/dL — AB
SPECIFIC GRAVITY, URINE: 1.026 (ref 1.005–1.030)
WBC, UA: >50 WBC/hpf — ABNORMAL HIGH (ref 0–5)
pH: 6 (ref 5.0–8.0)

## 2017-12-13 LAB — WET PREP, GENITAL
SPERM: NONE SEEN
Yeast Wet Prep HPF POC: NONE SEEN

## 2017-12-13 LAB — I-STAT BETA HCG BLOOD, ED (MC, WL, AP ONLY): I-stat hCG, quantitative: <5 m[IU]/mL (ref <5)

## 2017-12-13 LAB — LIPASE, BLOOD: LIPASE: 32 U/L (ref 11–51)

## 2017-12-13 MED ORDER — LIDOCAINE HCL (PF) 1 % IJ SOLN
2.0000 mL | Freq: Once | INTRAMUSCULAR | Status: AC
  Administered 2017-12-13: 2 mL
  Filled 2017-12-13: qty 30

## 2017-12-13 MED ORDER — DOXYCYCLINE HYCLATE 100 MG PO CAPS: 100.0000 mg | ORAL_CAPSULE | Freq: Two times a day (BID) | ORAL | 0 refills | Status: AC

## 2017-12-13 MED ORDER — ONDANSETRON 4 MG PO TBDP: ORAL_TABLET | ORAL | 0 refills | Status: DC

## 2017-12-13 MED ORDER — METRONIDAZOLE 500 MG PO TABS: 500.0000 mg | ORAL_TABLET | Freq: Two times a day (BID) | ORAL | 0 refills | Status: DC

## 2017-12-13 MED ORDER — METRONIDAZOLE 500 MG PO TABS
500.0000 mg | ORAL_TABLET | Freq: Once | ORAL | Status: AC
  Administered 2017-12-13: 500 mg via ORAL
  Filled 2017-12-13: qty 1

## 2017-12-13 MED ORDER — CEFTRIAXONE SODIUM 250 MG IJ SOLR
250.0000 mg | Freq: Once | INTRAMUSCULAR | Status: AC
  Administered 2017-12-13: 250 mg via INTRAMUSCULAR
  Filled 2017-12-13: qty 250

## 2017-12-13 MED ORDER — MORPHINE SULFATE (PF) 4 MG/ML IV SOLN
4.0000 mg | Freq: Once | INTRAVENOUS | Status: AC
  Administered 2017-12-13: 4 mg via INTRAVENOUS
  Filled 2017-12-13: qty 1

## 2017-12-13 MED ORDER — METRONIDAZOLE 500 MG PO TABS: 500.0000 mg | ORAL_TABLET | Freq: Two times a day (BID) | ORAL | 0 refills | Status: AC

## 2017-12-13 MED ORDER — HYDROCODONE-ACETAMINOPHEN 5-325 MG PO TABS: 1.0000 | ORAL_TABLET | Freq: Four times a day (QID) | ORAL | 0 refills | Status: DC | PRN

## 2017-12-13 MED ORDER — ONDANSETRON HCL 4 MG/2ML IJ SOLN
4.0000 mg | Freq: Once | INTRAMUSCULAR | Status: AC
  Administered 2017-12-13: 4 mg via INTRAVENOUS
  Filled 2017-12-13: qty 2

## 2017-12-13 MED ORDER — KETOROLAC TROMETHAMINE 30 MG/ML IJ SOLN
30.0000 mg | Freq: Once | INTRAMUSCULAR | Status: AC
  Administered 2017-12-13: 30 mg via INTRAVENOUS
  Filled 2017-12-13: qty 1

## 2017-12-13 MED ORDER — SODIUM CHLORIDE 0.9 % IV BOLUS
500.0000 mL | Freq: Once | INTRAVENOUS | Status: AC
Start: 2017-12-13 — End: 2017-12-13
  Administered 2017-12-13: 500 mL via INTRAVENOUS

## 2017-12-13 MED ORDER — DOXYCYCLINE HYCLATE 100 MG PO TABS
100.0000 mg | ORAL_TABLET | Freq: Once | ORAL | Status: AC
  Administered 2017-12-13: 100 mg via ORAL
  Filled 2017-12-13: qty 1

## 2017-12-13 NOTE — ED Provider Notes (Signed)
[facility] DEPT Provider Note   CSN: [id number] Arrival date & time: 12/13/17  1319     History   Chief Complaint Chief Complaint  Patient presents with  . Vaginal Bleeding  . Abdominal Pain    HPI Gabriella Snyder is a 42 y.o. female.  Gabriella Snyder is a 42 y.o. Female with a history of anemia, migraines, uterine fibroids and dysfunctional uterine bleeding, who presents to the emergency department for increased vaginal bleeding and lower abdominal cramping.  Patient reports since her period started about 1 month ago she has had continued bleeding throughout the month.  She reports her typical menstrual cycle last about 3 days, with bleeding and minimal cramping.  Initial cycle this month lasted 5 days with heavy bleeding, patient has continued to have intermittent spotting daily since then, and reports associated severe lower abdominal cramping which is worse left than the right.  Even with her history of uterine fibroids it is not red bleeding like this before she is felt nauseated with the pain but has not had any episodes of vomiting, no diarrhea, melena or hematochezia.  She denies dysuria or urinary urgency.  She is currently sexually active but has not noted any increased vaginal discharge.  No upper abdominal pain, no chest pain or shortness of breath.  No fevers or chills.  She has been taking ibuprofen and Tylenol for her father gave her some narcotic pain medication last night due to increasing pain but this did not seem to help for long either.  Has been seen by Dr. Hulan Fray at [facility] in the past for abnormal bleeding and uterine fibroids which have been following closely to try and avoid hysterectomy.  Patient had her tubes tied remotely.     Past Medical History:  Diagnosis Date  . Anemia   . Anxiety   . Depression   . Heart murmur    "when I was younger; it closed up"  . History of blood transfusion 1993   "related to the  C-section  . Migraine    "a couple times/yr" (02/25/2014)  . Muscle spasm   . Pyelonephritis 02/25/2014    Patient Active Problem List   Diagnosis Date Noted  . Morbid obesity ([place]) 02/17/2017  . DUB (dysfunctional uterine bleeding) 02/17/2017  . Yeast vaginitis 02/17/2017  . BV (bacterial vaginosis) 02/27/2014  . Iron deficiency anemia 02/27/2014  . Pyelonephritis 02/25/2014  . Acute pyelonephritis 02/25/2014    Past Surgical History:  Procedure Laterality Date  . CESAREAN SECTION  1993  . DILATION AND CURETTAGE OF UTERUS  2003  . TUBAL LIGATION  2011  . TUMOR EXCISION Right ~ 2004   "off my back"     OB History    Gravida  8   Para  5   Term  5   Preterm  0   AB  2   Living  5     SAB  1   TAB  0   Ectopic  0   Multiple  0   Live Births               Home Medications    Prior to Admission medications   Medication Sig Start Date End Date Taking? Authorizing Provider  acetaminophen (TYLENOL) 500 MG tablet Take 1,000 mg by mouth every 6 (six) hours as needed for mild pain.   Yes [provider]  cyclobenzaprine (FLEXERIL) 5 MG tablet Take 1 tablet (5 mg total) by  mouth 3 (three) times daily as needed for muscle spasms. 04/16/16  Yes Domenic Moras, PA-C  ibuprofen (ADVIL,MOTRIN) 200 MG tablet Take 200 mg by mouth every 6 (six) hours as needed for moderate pain.   Yes [provider]  doxycycline (VIBRAMYCIN) 100 MG capsule Take 1 capsule (100 mg total) by mouth 2 (two) times daily for 14 days. One po bid x 7 days 12/13/17 12/27/17  Jacqlyn Larsen, PA-C  ferrous sulfate 325 (65 FE) MG tablet Take 1 tablet (325 mg total) by mouth daily. Patient not taking: Reported on 12/30/2015 12/17/15   Jorje Guild, NP  HYDROcodone-acetaminophen (NORCO) 5-325 MG tablet Take 1 tablet by mouth every 6 (six) hours as needed. 12/13/17   Jacqlyn Larsen, PA-C  metroNIDAZOLE (FLAGYL) 500 MG tablet Take 1 tablet (500 mg total) by mouth 2 (two) times daily for 14  days. One po bid x 7 days 12/13/17 12/27/17  Jacqlyn Larsen, PA-C  naproxen (NAPROSYN) 375 MG tablet Take 1 tablet (375 mg total) by mouth 2 (two) times daily. Patient not taking: Reported on 01/11/2017 04/16/16   Domenic Moras, PA-C  ondansetron [facility] ODT) 4 MG disintegrating tablet 4mg  ODT q4 hours prn nausea/vomit 12/13/17   Jacqlyn Larsen, PA-C  oxyCODONE-acetaminophen (PERCOCET/ROXICET) 5-325 MG per tablet Take 1-2 tablets by mouth every 6 (six) hours as needed for severe pain. Patient not taking: Reported on 12/13/2017 01/09/15   Serita Grit, MD    Family History Family History  Problem Relation Age of Onset  . Diabetes Mother   . Hypertension Father   . Cancer Maternal Grandfather   . Cancer Maternal Aunt        cervical  . Cancer Cousin        lymphoma  . Stroke Paternal Grandfather     Social History Social History   Tobacco Use  . Smoking status: Current Every Day Smoker    Packs/day: 0.25    Years: 6.00    Pack years: 1.50    Types: Cigarettes  . Smokeless tobacco: Never Used  Substance Use Topics  . Alcohol use: Yes    Comment: 02/25/2014 "don't really drink; might have a drink q now and then"  . Drug use: Yes    Types: Marijuana    Comment: 02/25/2014 "usually once/day; nothing in the last week"     Allergies   Patient has no known allergies.   Review of Systems Review of Systems  Constitutional: Negative for chills and fever.  HENT: Negative.   Eyes: Negative for visual disturbance.  Respiratory: Negative for cough and shortness of breath.   Cardiovascular: Negative for chest pain and palpitations.  Gastrointestinal: Positive for abdominal pain and nausea. Negative for blood in stool, constipation, diarrhea and vomiting.  Genitourinary: Positive for pelvic pain and vaginal bleeding. Negative for dysuria, flank pain, frequency, vaginal discharge and vaginal pain.  Musculoskeletal: Negative for arthralgias, back pain and myalgias.  Skin: Negative for color  change and rash.  Neurological: Negative for dizziness, syncope and light-headedness.     Physical Exam Updated Vital Signs BP (!) 134/97 (BP Location: Left Arm)   Pulse 70   Temp 98.4 F (36.9 C) (Oral)   Resp 16   Ht 5\' 9"  (1.753 m)   Wt 97.1 kg (214 lb)   LMP 12/13/2017   SpO2 100%   BMI 31.60 kg/m   Physical Exam  Constitutional: She is oriented to person, place, and time. She appears well-developed and well-nourished.  Non-toxic appearance.  She does not appear ill. No distress.  HENT:  Head: Normocephalic and atraumatic.  Mouth/Throat: Oropharynx is clear and moist.  Eyes: Right eye exhibits no discharge. Left eye exhibits no discharge.  Cardiovascular: Normal rate, regular rhythm, normal heart sounds and intact distal pulses.  Pulmonary/Chest: Effort normal and breath sounds normal. No respiratory distress.  Respirations equal and unlabored, patient able to speak in full sentences, lungs clear to auscultation bilaterally  Abdominal: Soft. Normal appearance and bowel sounds are normal. She exhibits no distension. There is tenderness.  Abdomen soft, nondistended, bowel sounds present throughout, there is tenderness across the lower abdomen without guarding, upper abdomen is unremarkable.  Genitourinary:  Genitourinary Comments: Chaperone present during pelvic exam. No external genital lesions noted. Speculum exam reveals a small amount of blood in the vaginal vault, no brisk bleeding from the cervical os, small amount of discharge present as well. Patient has a lot of discomfort with bimanual exam, there is no specific cervical motion tenderness patient has a test to palpation over the uterus and bilateral adnexa without palpable masses.  Neurological: She is alert and oriented to person, place, and time. Coordination normal.  Skin: Skin is warm and dry. Capillary refill takes less than 2 seconds. She is not diaphoretic.  Psychiatric: She has a normal mood and affect. Her  behavior is normal.  Nursing note and vitals reviewed.    ED Treatments / Results  Labs (all labs ordered are listed, but only abnormal results are displayed) Labs Reviewed  WET PREP, GENITAL - Abnormal; Notable for the following components:      Result Value   Trich, Wet Prep PRESENT (*)    Clue Cells Wet Prep HPF POC PRESENT (*)    WBC, Wet Prep HPF POC FEW (*)    All other components within normal limits  CBC WITH DIFFERENTIAL/PLATELET - Abnormal; Notable for the following components:   WBC 12.7 (*)    Hemoglobin 10.8 (*)    HCT 34.3 (*)    MCV 68.5 (*)    MCH 21.6 (*)    RDW 20.9 (*)    Lymphs Abs 5.5 (*)    All other components within normal limits  COMPREHENSIVE METABOLIC PANEL - Abnormal; Notable for the following components:   Creatinine, Ser 1.10 (*)    Total Protein 8.2 (*)    All other components within normal limits  URINALYSIS, ROUTINE W REFLEX MICROSCOPIC - Abnormal; Notable for the following components:   APPearance HAZY (*)    Hgb urine dipstick LARGE (*)    Protein, ur 30 (*)    Leukocytes, UA MODERATE (*)    WBC, UA >50 (*)    All other components within normal limits  LIPASE, BLOOD  HIV ANTIBODY (ROUTINE TESTING)  RPR  I-STAT BETA HCG BLOOD, ED ([facility], [facility], [facility] ONLY)  GC/CHLAMYDIA PROBE AMP ([place]) NOT AT [facility]    EKG None  Radiology US Transvaginal Non-ob  Result Date: 12/13/2017 CLINICAL DATA:  42 year old G8 P5 AB3, presenting with 1 month history of dysfunctional uterine bleeding and BILATERAL pelvic pain, LEFT greater than RIGHT. EXAM: TRANSABDOMINAL AND TRANSVAGINAL ULTRASOUND OF PELVIS DOPPLER ULTRASOUND OF OVARIES TECHNIQUE: Both transabdominal and transvaginal ultrasound examinations of the pelvis were performed. Transabdominal technique was performed for global imaging of the pelvis including uterus, ovaries, adnexal regions, and pelvic cul-de-sac. It was necessary to proceed with endovaginal exam following the transabdominal exam to  adequately visualize the endometrium and ovaries. Color and duplex Doppler ultrasound was utilized to  evaluate blood flow to the ovaries. COMPARISON:  01/14/2017, 12/14/2015 and earlier. CT abdomen and pelvis 12/16/2015. FINDINGS: Uterus Measurements: Approximately 9.6 x 7.2 x 7.1 cm. Subserosal fibroid arising from the ANTERIOR uterine body measuring approximately 3.7 x 3.4 x 4.1 cm. Subserosal fibroid arising from the ANTERIOR fundus measuring approximately 2.0 x 1.9 x 1.6 cm, better seen on today's examination. Endometrium Thickness: 11 mm. Normal appearance without evidence of endometrial fluid or mass. Right ovary Measurements: Approximately 2.0 x 1.3 x 1.1 cm. Small follicular cysts. No dominant cyst or solid mass. Normal color Doppler flow within the ovary. Left ovary Measurements: Approximately 1.9 x 1.9 x 2.2 cm. Small follicular cysts. No dominant cyst or solid mass. Normal color Doppler flow within the ovary. Pulsed Doppler evaluation of both ovaries demonstrates normal low-resistance arterial and venous waveforms. Other findings No abnormal free fluid. IMPRESSION: 1. At least 2 uterine fibroids, the largest of which arises from the ANTERIOR uterine body, measurements given above, unchanged from prior examinations. 2. Normal endometrial thickness of 11 mm. If bleeding remains unresponsive to hormonal or medical therapy, sonohysterogram should be considered for focal lesion work-up. (Ref: Radiological Reasoning: Algorithmic Workup of Abnormal Vaginal Bleeding with Endovaginal Sonography and Sonohysterography. AJR 2008; 629:B28-41). 3. Normal appearing ovaries. Electronically Signed   By: Evangeline Dakin M.D.   On: 12/13/2017 20:54   US Pelvis Complete  Result Date: 12/13/2017 CLINICAL DATA:  41 year old G8 P5 AB3, presenting with 1 month history of dysfunctional uterine bleeding and BILATERAL pelvic pain, LEFT greater than RIGHT. EXAM: TRANSABDOMINAL AND TRANSVAGINAL ULTRASOUND OF PELVIS DOPPLER  ULTRASOUND OF OVARIES TECHNIQUE: Both transabdominal and transvaginal ultrasound examinations of the pelvis were performed. Transabdominal technique was performed for global imaging of the pelvis including uterus, ovaries, adnexal regions, and pelvic cul-de-sac. It was necessary to proceed with endovaginal exam following the transabdominal exam to adequately visualize the endometrium and ovaries. Color and duplex Doppler ultrasound was utilized to evaluate blood flow to the ovaries. COMPARISON:  01/14/2017, 12/14/2015 and earlier. CT abdomen and pelvis 12/16/2015. FINDINGS: Uterus Measurements: Approximately 9.6 x 7.2 x 7.1 cm. Subserosal fibroid arising from the ANTERIOR uterine body measuring approximately 3.7 x 3.4 x 4.1 cm. Subserosal fibroid arising from the ANTERIOR fundus measuring approximately 2.0 x 1.9 x 1.6 cm, better seen on today's examination. Endometrium Thickness: 11 mm. Normal appearance without evidence of endometrial fluid or mass. Right ovary Measurements: Approximately 2.0 x 1.3 x 1.1 cm. Small follicular cysts. No dominant cyst or solid mass. Normal color Doppler flow within the ovary. Left ovary Measurements: Approximately 1.9 x 1.9 x 2.2 cm. Small follicular cysts. No dominant cyst or solid mass. Normal color Doppler flow within the ovary. Pulsed Doppler evaluation of both ovaries demonstrates normal low-resistance arterial and venous waveforms. Other findings No abnormal free fluid. IMPRESSION: 1. At least 2 uterine fibroids, the largest of which arises from the ANTERIOR uterine body, measurements given above, unchanged from prior examinations. 2. Normal endometrial thickness of 11 mm. If bleeding remains unresponsive to hormonal or medical therapy, sonohysterogram should be considered for focal lesion work-up. (Ref: Radiological Reasoning: Algorithmic Workup of Abnormal Vaginal Bleeding with Endovaginal Sonography and Sonohysterography. AJR 2008; 324:M01-02). 3. Normal appearing ovaries.  Electronically Signed   By: Evangeline Dakin M.D.   On: 12/13/2017 20:54   [place] Art/ven Flow Abd Pelv Doppler  Result Date: 12/13/2017 CLINICAL DATA:  42 year old G8 P5 AB3, presenting with 1 month history of dysfunctional uterine bleeding and BILATERAL pelvic pain, LEFT greater than RIGHT. EXAM:  TRANSABDOMINAL AND TRANSVAGINAL ULTRASOUND OF PELVIS DOPPLER ULTRASOUND OF OVARIES TECHNIQUE: Both transabdominal and transvaginal ultrasound examinations of the pelvis were performed. Transabdominal technique was performed for global imaging of the pelvis including uterus, ovaries, adnexal regions, and pelvic cul-de-sac. It was necessary to proceed with endovaginal exam following the transabdominal exam to adequately visualize the endometrium and ovaries. Color and duplex Doppler ultrasound was utilized to evaluate blood flow to the ovaries. COMPARISON:  01/14/2017, 12/14/2015 and earlier. CT abdomen and pelvis 12/16/2015. FINDINGS: Uterus Measurements: Approximately 9.6 x 7.2 x 7.1 cm. Subserosal fibroid arising from the ANTERIOR uterine body measuring approximately 3.7 x 3.4 x 4.1 cm. Subserosal fibroid arising from the ANTERIOR fundus measuring approximately 2.0 x 1.9 x 1.6 cm, better seen on today's examination. Endometrium Thickness: 11 mm. Normal appearance without evidence of endometrial fluid or mass. Right ovary Measurements: Approximately 2.0 x 1.3 x 1.1 cm. Small follicular cysts. No dominant cyst or solid mass. Normal color Doppler flow within the ovary. Left ovary Measurements: Approximately 1.9 x 1.9 x 2.2 cm. Small follicular cysts. No dominant cyst or solid mass. Normal color Doppler flow within the ovary. Pulsed Doppler evaluation of both ovaries demonstrates normal low-resistance arterial and venous waveforms. Other findings No abnormal free fluid. IMPRESSION: 1. At least 2 uterine fibroids, the largest of which arises from the ANTERIOR uterine body, measurements given above, unchanged from prior  examinations. 2. Normal endometrial thickness of 11 mm. If bleeding remains unresponsive to hormonal or medical therapy, sonohysterogram should be considered for focal lesion work-up. (Ref: Radiological Reasoning: Algorithmic Workup of Abnormal Vaginal Bleeding with Endovaginal Sonography and Sonohysterography. AJR 2008; 268:T41-96). 3. Normal appearing ovaries. Electronically Signed   By: Evangeline Dakin M.D.   On: 12/13/2017 20:54    Procedures Procedures (including critical care time)  Medications Ordered in ED Medications  sodium chloride 0.9 % bolus 500 mL (0 mLs Intravenous Stopped 12/13/17 1940)  ondansetron (ZOFRAN) injection 4 mg (4 mg Intravenous Given 12/13/17 1838)  ketorolac (TORADOL) 30 MG/ML injection 30 mg (30 mg Intravenous Given 12/13/17 1839)  morphine 4 MG/ML injection 4 mg (4 mg Intravenous Given 12/13/17 2134)  cefTRIAXone (ROCEPHIN) injection 250 mg (250 mg Intramuscular Given 12/13/17 2246)  doxycycline (VIBRA-TABS) tablet 100 mg (100 mg Oral Given 12/13/17 2247)  metroNIDAZOLE (FLAGYL) tablet 500 mg (500 mg Oral Given 12/13/17 2247)  lidocaine (PF) (XYLOCAINE) 1 % injection 2 mL (2 mLs Other Given 12/13/17 2247)     Initial Impression / Assessment and Plan / ED Course  I have reviewed the triage vital signs and the nursing notes.  Pertinent labs & imaging results that were available during my care of the patient were reviewed by me and considered in my medical decision making (see chart for details).  Urine out.  Patient presents for evaluation of abnormal vaginal bleeding over the past month, with increasing lower abdominal pain and cramping over the past week.  History of fibroids but has no effect issues with irregular bleeding or pain like this recently.  No fevers, unsure of vaginal discharge.  On exam patient has some lower abdominal tenderness without guarding.  Pelvic exam reveals small amount of bleeding as well as some discharge.  Patient has significant discomfort  throughout bimanual exam with no obvious even fatigue, but this is still somewhat concerning for PID.  STD testing collected, as well as basic abdominal labs.  Will get pelvic ultrasound given discomfort on exam.  Toradol and Zofran for pain.  Labs show mild leukocytosis of  12.7, hemoglobin stable at 10.8.,  No acute electrolyte derangements, normal lingular renal function, normal lipase.  Wet prep positive for trichomonas, clue cells and white blood cells.  Urinalysis with blood as well as some leukocytes but no bacteria.  Given patient's trichomonas on exam I feel this is more likely the source of patient's leukocytes in the urine however rather than urinary tract infection, she is not having any urinary symptoms.  Pelvic ultrasound shows 2 uterine fibroids which are unchanged from prior study, normal endometrial thickness, no evidence of tubo-ovarian abscess, there are some small bilateral follicular cysts, normal flow to bilateral ovaries.  Pelvic ultrasound is reassuring, but will treat for PID.  I am Rocephin, as well as doxycycline and Flagyl given here in the ED.  Will discharge with 2 weeks of antibiotics, small amount of pain medication and Zofran given for symptomatic management.  Patient to follow-up with Dr. Hulan Fray with OB/GYN.  Strict return precautions discussed.  Patient expresses understanding and is in agreement with plan.  Final Clinical Impressions(s) / ED Diagnoses   Final diagnoses:  PID (acute pelvic inflammatory disease)  Trichomonas infection  BV (bacterial vaginosis)  Vaginal bleeding    ED Discharge Orders         Ordered    doxycycline (VIBRAMYCIN) 100 MG capsule  2 times daily     12/13/17 2229    metroNIDAZOLE (FLAGYL) 500 MG tablet  2 times daily,   Status:  Discontinued     12/13/17 2229    ondansetron (ZOFRAN ODT) 4 MG disintegrating tablet     12/13/17 2233    HYDROcodone-acetaminophen (NORCO) 5-325 MG tablet  Every 6 hours PRN     12/13/17 2233     metroNIDAZOLE (FLAGYL) 500 MG tablet  2 times daily     12/13/17 2235           Jacqlyn Larsen, PA-C 12/15/17 [id number]    Fredia Sorrow, MD 12/16/17 [id number]

## 2017-12-13 NOTE — ED Triage Notes (Signed)
Patient reports that she has been having an increase in vaginal bleeding and increased frequency x 1 month. Patient also c/o left mid abdominal cramping x 1 week. Patient c/o headache x 1 week.

## 2017-12-13 NOTE — Discharge Instructions (Addendum)
You tested positive for trichomonas today given the worsening pain he is been experiencing I think you likely have pelvic inflammatory disease.  This is treated with antibiotics you should take doxycycline and Flagyl twice daily for the next 14 days, it is very important that you complete your entire course of antibiotics.  Do not drink alcohol while taking Flagyl as it can cause severe vomiting.  Please follow-up with your OB/GYN for recheck.  I recommend pelvic rest until you have completed your entire course of antibiotics, make sure you are using protection once he become sexually active again, and let any partners know so they can be tested and treated as well.  You have additional STD testing pending will be contacted in 2 to 3 days with any positive results.  Your hemoglobin is stable today please follow-up with Dr. Hulan Fray regarding vaginal bleeding.  Zofran as needed for nausea, ibuprofen and Tylenol for pain, Norco for breakthrough pain.  Return to the emergency department for worsening abdominal pain, fevers, nausea, vomiting or any other new or concerning symptoms.

## 2017-12-14 LAB — HIV ANTIBODY (ROUTINE TESTING W REFLEX): HIV Screen 4th Generation wRfx: NONREACTIVE

## 2017-12-14 LAB — RPR: RPR: NONREACTIVE

## 2017-12-14 LAB — GC/CHLAMYDIA PROBE AMP (~~LOC~~) NOT AT ARMC
Chlamydia: NEGATIVE
NEISSERIA GONORRHEA: NEGATIVE

## 2017-12-26 ENCOUNTER — Encounter: Payer: Self-pay | Admitting: Advanced Practice Midwife

## 2017-12-26 ENCOUNTER — Ambulatory Visit (INDEPENDENT_AMBULATORY_CARE_PROVIDER_SITE_OTHER): Payer: Medicaid Other | Admitting: Advanced Practice Midwife

## 2017-12-26 ENCOUNTER — Other Ambulatory Visit (HOSPITAL_COMMUNITY)
Admission: RE | Admit: 2017-12-26 | Discharge: 2017-12-26 | Disposition: A | Discharge status: Home / Self Care | Payer: Medicaid Other | Source: Ambulatory Visit | Attending: Advanced Practice Midwife | Admitting: Advanced Practice Midwife

## 2017-12-26 VITALS — BP 125/64 | HR 76 | Ht 70.0 in | Wt 218.0 lb

## 2017-12-26 DIAGNOSIS — N898 Other specified noninflammatory disorders of vagina: Secondary | ICD-10-CM | POA: Diagnosis not present

## 2017-12-26 DIAGNOSIS — B3731 Acute candidiasis of vulva and vagina: Secondary | ICD-10-CM

## 2017-12-26 DIAGNOSIS — A5901 Trichomonal vulvovaginitis: Secondary | ICD-10-CM

## 2017-12-26 DIAGNOSIS — B373 Candidiasis of vulva and vagina: Secondary | ICD-10-CM

## 2017-12-26 MED ORDER — TERCONAZOLE 0.4 % VA CREA: 1.0000 | TOPICAL_CREAM | Freq: Every day | VAGINAL | 1 refills | Status: DC

## 2017-12-26 MED ORDER — FLUCONAZOLE 150 MG PO TABS
150.0000 mg | ORAL_TABLET | Freq: Once | ORAL | 3 refills | Status: DC | PRN
Start: 2017-12-26 — End: 2018-11-08

## 2017-12-26 NOTE — Progress Notes (Signed)
Patient complaining of vaginal itching. Patient states that she is unable to take all antibiotics due to side effects. Kathrene Alu RN

## 2017-12-27 ENCOUNTER — Encounter: Payer: Self-pay | Admitting: Advanced Practice Midwife

## 2017-12-27 LAB — CERVICOVAGINAL ANCILLARY ONLY
Bacterial vaginitis: NEGATIVE
Candida vaginitis: POSITIVE — AB
TRICH (WINDOWPATH): NEGATIVE

## 2017-12-27 NOTE — Patient Instructions (Signed)
Vaginal Yeast infection, Adult Vaginal yeast infection is a condition that causes soreness, swelling, and redness (inflammation) of the vagina. It also causes vaginal discharge. This is a common condition. Some women get this infection frequently. What are the causes? This condition is caused by a change in the normal balance of the yeast (candida) and bacteria that live in the vagina. This change causes an overgrowth of yeast, which causes the inflammation. What increases the risk? This condition is more likely to develop in:  Women who take antibiotic medicines.  Women who have diabetes.  Women who take birth control pills.  Women who are pregnant.  Women who douche often.  Women who have a weak defense (immune) system.  Women who have been taking steroid medicines for a long time.  Women who frequently wear tight clothing.  What are the signs or symptoms? Symptoms of this condition include:  White, thick vaginal discharge.  Swelling, itching, redness, and irritation of the vagina. The lips of the vagina (vulva) may be affected as well.  Pain or a burning feeling while urinating.  Pain during sex.  How is this diagnosed? This condition is diagnosed with a medical history and physical exam. This will include a pelvic exam. Your health care provider will examine a sample of your vaginal discharge under a microscope. Your health care provider may send this sample for testing to confirm the diagnosis. How is this treated? This condition is treated with medicine. Medicines may be over-the-counter or prescription. You may be told to use one or more of the following:  Medicine that is taken orally.  Medicine that is applied as a cream.  Medicine that is inserted directly into the vagina (suppository).  Follow these instructions at home:  Take or apply over-the-counter and prescription medicines only as told by your health care provider.  Do not have sex until your health  care provider has approved. Tell your sex partner that you have a yeast infection. That person should go to his or her health care provider if he or she develops symptoms.  Do not wear tight clothes, such as pantyhose or tight pants.  Avoid using tampons until your health care provider approves.  Eat more yogurt. This may help to keep your yeast infection from returning.  Try taking a sitz bath to help with discomfort. This is a warm water bath that is taken while you are sitting down. The water should only come up to your hips and should cover your buttocks. Do this 3-4 times per day or as told by your health care provider.  Do not douche.  Wear breathable, cotton underwear.  If you have diabetes, keep your blood sugar levels under control. Contact a health care provider if:  You have a fever.  Your symptoms go away and then return.  Your symptoms do not get better with treatment.  Your symptoms get worse.  You have new symptoms.  You develop blisters in or around your vagina.  You have blood coming from your vagina and it is not your menstrual period.  You develop pain in your abdomen. This information is not intended to replace advice given to you by your health care provider. Make sure you discuss any questions you have with your health care provider. Document Released: 02/02/2005 Document Revised: 10/07/2015 Document Reviewed: 10/27/2014 Elsevier Interactive Patient Education  2018 [employer]. Trichomoniasis Trichomoniasis is an STI (sexually transmitted infection) that can affect both women and men. In women, the outer area of  the female genitalia (vulva) and the vagina are affected. In men, the penis is mainly affected, but the prostate and other reproductive organs can also be involved. This condition can be treated with medicine. It often has no symptoms (is asymptomatic), especially in men. What are the causes? This condition is caused by an organism called  Trichomonas vaginalis. Trichomoniasis most often spreads from person to person (is contagious) through sexual contact. What increases the risk? The following factors may make you more likely to develop this condition:  Having unprotected sexual intercourse.  Having sexual intercourse with a partner who has trichomoniasis.  Having multiple sexual partners.  Having had previous trichomoniasis infections or other STIs.  What are the signs or symptoms? In women, symptoms of trichomoniasis include:  Abnormal vaginal discharge that is clear, white, gray, or yellow-green and foamy and has an unusual "fishy" odor.  Itching and irritation of the vagina and vulva.  Burning or pain during urination or sexual intercourse.  Genital redness and swelling.  In men, symptoms of trichomoniasis include:  Penile discharge that may be foamy or contain pus.  Pain in the penis. This may happen only when urinating.  Itching or irritation inside the penis.  Burning after urination or ejaculation.  How is this diagnosed? In women, this condition may be found during a routine Pap test or physical exam. It may be found in men during a routine physical exam. Your health care provider may perform tests to help diagnose this infection, such as:  Urine tests (men and women).  The following in women: ? Testing the pH of the vagina. ? A vaginal swab test that checks for the Trichomonas vaginalis organism. ? Testing vaginal secretions.  Your health care provider may test you for other STIs, including HIV (human immunodeficiency virus). How is this treated? This condition is treated with medicine taken by mouth (orally), such as metronidazole or tinidazole to fight the infection. Your sexual partner(s) may also need to be tested and treated.  If you are a woman and you plan to become pregnant or think you may be pregnant, tell your health care provider right away. Some medicines that are used to treat the  infection should not be taken during pregnancy.  Your health care provider may recommend over-the-counter medicines or creams to help relieve itching or irritation. You may be tested for infection again 3 months after treatment. Follow these instructions at home:  Take and use over-the-counter and prescription medicines, including creams, only as told by your health care provider.  Do not have sexual intercourse until one week after you finish your medicine, or until your health care provider approves. Ask your health care provider when you may resume sexual intercourse.  (Women) Do not douche or wear tampons while you have the infection.  Discuss your infection with your sexual partner(s). Make sure that your partner gets tested and treated, if necessary.  Keep all follow-up visits as told by your health care provider. This is important. How is this prevented?  Use condoms every time you have sex. Using condoms correctly and consistently can help protect against STIs.  Avoid having multiple sexual partners.  Talk with your sexual partner about any symptoms that either of you may have, as well as any history of STIs.  Get tested for STIs and STDs (sexually transmitted diseases) before you have sex. Ask your partner to do the same.  Do not have sexual contact if you have symptoms of trichomoniasis or another STI. Contact  a health care provider if:  You still have symptoms after you finish your medicine.  You develop pain in your abdomen.  You have pain when you urinate.  You have bleeding after sexual intercourse.  You develop a rash.  You feel nauseous or you vomit.  You plan to become pregnant or think you may be pregnant. Summary  Trichomoniasis is an STI (sexually transmitted infection) that can affect both women and men.  This condition often has no symptoms (is asymptomatic), especially in men.  You should not have sexual intercourse until one week after you finish  your medicine, or until your health care provider approves. Ask your health care provider when you may resume sexual intercourse.  Discuss your infection with your sexual partner. Make sure that your partner gets tested and treated, if necessary. This information is not intended to replace advice given to you by your health care provider. Make sure you discuss any questions you have with your health care provider. Document Released: 10/19/2000 Document Revised: 03/18/2016 Document Reviewed: 03/18/2016 Elsevier Interactive Patient Education  2017 [employer].

## 2017-12-27 NOTE — Progress Notes (Signed)
   Subjective:   Patient ID: Gabriella Snyder, female    DOB: 06-17-1975, 42 y.o.   MRN: [id number]  This is a 42 y.o. female who presents with c/o vaginal itching post treatment with multiple antibiotics for Presumed PID.  Was seen in ER on 12/13/17 for pain and found to have Trichomonas.  Was given 14 days of Doxy and Flagyl.  Has been itching, despite self treatment with Monistat.  Vaginal Discharge  The patient's primary symptoms include genital itching and vaginal discharge. The patient's pertinent negatives include no genital lesions, genital odor, pelvic pain or vaginal bleeding. This is a new problem. The current episode started in the past 7 days. The problem has been unchanged. The patient is experiencing no pain. She is not pregnant. Pertinent negatives include no abdominal pain, chills, constipation, diarrhea, nausea or vomiting. The vaginal discharge was thick and white. There has been no bleeding. She has not been passing clots. She has not been passing tissue. Nothing aggravates the symptoms. She has tried antifungals for the symptoms. The treatment provided no relief. She is sexually active. It is unknown whether or not her partner has an STD.   Review of Systems  Constitutional: Negative for chills.  Gastrointestinal: Negative for abdominal pain, constipation, diarrhea, nausea and vomiting.  Genitourinary: Positive for vaginal discharge. Negative for pelvic pain.       Objective:   Physical Exam  Constitutional: She appears well-developed and well-nourished.  HENT:  Head: Normocephalic.  Cardiovascular: Normal rate.  Pulmonary/Chest: Effort normal.  Abdominal: Soft. She exhibits no mass. There is no tenderness. There is no rebound and no guarding.  Genitourinary: Vaginal discharge found.  Genitourinary Comments: Copious thick white curdlike discharge, mild erethema  Skin: No rash noted. No erythema.  Psychiatric: She has a normal mood and affect.      Assessment & Plan:    Probable yeast vaginitis S/P treatment for Trich and PID  Discussed nature of Dalbert Batman (partner denies, states was tested negative, papers show he was not tested for Trich, only other STDs) Will retest today Rx Terazol for external use Rx Diflucan for yeast  Followup as needed

## 2018-01-15 ENCOUNTER — Other Ambulatory Visit (HOSPITAL_COMMUNITY)
Admission: RE | Admit: 2018-01-15 | Discharge: 2018-01-15 | Disposition: A | Discharge status: Home / Self Care | Payer: Medicaid Other | Source: Ambulatory Visit | Attending: Obstetrics & Gynecology | Admitting: Obstetrics & Gynecology

## 2018-01-15 ENCOUNTER — Ambulatory Visit: Payer: Medicaid Other | Admitting: Obstetrics & Gynecology

## 2018-01-15 ENCOUNTER — Encounter: Payer: Self-pay | Admitting: Obstetrics & Gynecology

## 2018-01-15 VITALS — BP 130/80 | HR 90 | Ht 70.0 in | Wt 221.0 lb

## 2018-01-15 DIAGNOSIS — B9689 Other specified bacterial agents as the cause of diseases classified elsewhere: Secondary | ICD-10-CM | POA: Insufficient documentation

## 2018-01-15 DIAGNOSIS — Z01419 Encounter for gynecological examination (general) (routine) without abnormal findings: Secondary | ICD-10-CM | POA: Insufficient documentation

## 2018-01-15 DIAGNOSIS — Z23 Encounter for immunization: Secondary | ICD-10-CM

## 2018-01-15 DIAGNOSIS — N760 Acute vaginitis: Secondary | ICD-10-CM | POA: Insufficient documentation

## 2018-01-15 DIAGNOSIS — Z0000 Encounter for general adult medical examination without abnormal findings: Secondary | ICD-10-CM | POA: Diagnosis not present

## 2018-01-15 NOTE — Progress Notes (Signed)
Last pap 01/11/17- normal

## 2018-01-15 NOTE — Progress Notes (Signed)
Subjective:    Gabriella Snyder is a 42 y.o. single P5 ( 50, 16, 58, 22, and 30 yo kids, 1/2 grands)  female who presents for an annual exam. The patient has no complaints today. She would like a TOC for trich/BV.  The patient is sexually active, monogamous for about a year.  GYN screening history: last pap: was normal. The patient wears seatbelts: yes. The patient participates in regular exercise: yes. Has the patient ever been transfused or tattooed?: yes. The patient reports that there is not domestic violence in her life.   Her most recent HBG was 10.3, She still has heavy periods, lasting 7 days, had a fibroid on u/s last year.   Menstrual History: OB History    Gravida  8   Para  5   Term  5   Preterm  0   AB  2   Living  5     SAB  1   TAB  0   Ectopic  0   Multiple  0   Live Births              Menarche age: 47 Patient's last menstrual period was 01/09/2018.    The following portions of the patient's history were reviewed and updated as appropriate: allergies, current medications, past family history, past medical history, past social history, past surgical history and problem list.  Review of Systems Pertinent items are noted in HPI.   FH- no breast, + cervical cancer - maternal aunt (deceased), + colon cancer in paternal GM Works at [employer] (works for [employer]) Had a BTL   Objective:    BP 130/80   Pulse 90   Ht 5\' 10"  (1.778 m)   Wt 221 lb (100.2 kg)   LMP 01/09/2018   BMI 31.71 kg/m   General Appearance:    Alert, cooperative, no distress, appears stated age  Head:    Normocephalic, without obvious abnormality, atraumatic  Eyes:    PERRL, conjunctiva/corneas clear, EOM's intact, fundi    benign, both eyes  Ears:    Normal TM's and external ear canals, both ears  Nose:   Nares normal, septum midline, mucosa normal, no drainage    or sinus tenderness  Throat:   Lips, mucosa, and tongue normal; teeth and gums normal  Neck:   Supple,  symmetrical, trachea midline, no adenopathy;    thyroid:  no enlargement/tenderness/nodules; no carotid   bruit or JVD  Back:     Symmetric, no curvature, ROM normal, no CVA tenderness  Lungs:     Clear to auscultation bilaterally, respirations unlabored  Chest Wall:    No tenderness or deformity   Heart:    Regular rate and rhythm, S1 and S2 normal, no murmur, rub   or gallop  Breast Exam:    No tenderness, masses, or nipple abnormality  Abdomen:     Soft, non-tender, bowel sounds active all four quadrants,    no masses, no organomegaly  Genitalia:    Normal female without lesion, discharge or tenderness, odor c/w BV, no decensus of cervix/uterus, 8 week size uterus, no adnexal masses      Extremities:   Extremities normal, atraumatic, no cyanosis or edema  Pulses:   2+ and symmetric all extremities  Skin:   Skin color, texture, turgor normal, no rashes or lesions  Lymph nodes:   Cervical, supraclavicular, and axillary nodes normal  Neurologic:   CNII-XII intact, normal strength, sensation and reflexes    throughout  .  Assessment:    Healthy female exam.    Plan:     Thin prep Pap smear. with cotesting Mammogram TOC Flu and Gardasil #1 today Gardasil #2 and TDAP in 2 months and to discuss her fibroid

## 2018-01-17 ENCOUNTER — Ambulatory Visit (INDEPENDENT_AMBULATORY_CARE_PROVIDER_SITE_OTHER): Payer: Medicaid Other

## 2018-01-17 DIAGNOSIS — Z1231 Encounter for screening mammogram for malignant neoplasm of breast: Secondary | ICD-10-CM | POA: Diagnosis not present

## 2018-01-17 DIAGNOSIS — Z01419 Encounter for gynecological examination (general) (routine) without abnormal findings: Secondary | ICD-10-CM

## 2018-01-17 LAB — CYTOLOGY - PAP
Bacterial vaginitis: POSITIVE — AB
Candida vaginitis: NEGATIVE
Diagnosis: NEGATIVE
HPV: NOT DETECTED
Trichomonas: NEGATIVE

## 2018-01-18 ENCOUNTER — Other Ambulatory Visit: Payer: Self-pay | Admitting: Obstetrics & Gynecology

## 2018-01-18 MED ORDER — METRONIDAZOLE 500 MG PO TABS: 500.0000 mg | ORAL_TABLET | Freq: Two times a day (BID) | ORAL | 0 refills | Status: DC

## 2018-01-30 ENCOUNTER — Other Ambulatory Visit: Payer: Self-pay

## 2018-02-01 ENCOUNTER — Telehealth: Payer: Self-pay

## 2018-02-01 DIAGNOSIS — B9689 Other specified bacterial agents as the cause of diseases classified elsewhere: Secondary | ICD-10-CM

## 2018-02-01 DIAGNOSIS — N760 Acute vaginitis: Principal | ICD-10-CM

## 2018-02-01 MED ORDER — METRONIDAZOLE 500 MG PO TABS: 500.0000 mg | ORAL_TABLET | Freq: Two times a day (BID) | ORAL | 0 refills | Status: DC

## 2018-02-01 NOTE — Telephone Encounter (Signed)
PT called stating that her Flagyl was sent to the wrong pharmacy. Rx sent to correct pharmacy

## 2018-03-19 ENCOUNTER — Ambulatory Visit: Payer: Medicaid Other

## 2018-03-27 ENCOUNTER — Ambulatory Visit (INDEPENDENT_AMBULATORY_CARE_PROVIDER_SITE_OTHER): Payer: Medicaid Other

## 2018-03-27 VITALS — BP 116/63 | HR 66 | Ht 70.0 in | Wt 226.0 lb

## 2018-03-27 DIAGNOSIS — Z23 Encounter for immunization: Secondary | ICD-10-CM | POA: Diagnosis not present

## 2018-03-27 NOTE — Progress Notes (Signed)
Gabriella Snyder here for TDAP and HPV   Injections.  Injections administered without complication. Patient will return in 6 months  for next injection.  chiquita l wilson, CMA 03/27/2018  3:42 PM

## 2018-03-29 NOTE — Progress Notes (Signed)
Agree with management by office staff Seabron Spates, CNM

## 2018-07-18 ENCOUNTER — Other Ambulatory Visit: Payer: Self-pay

## 2018-07-18 ENCOUNTER — Ambulatory Visit: Payer: Medicaid Other

## 2018-07-18 DIAGNOSIS — Z23 Encounter for immunization: Secondary | ICD-10-CM

## 2018-07-18 NOTE — Progress Notes (Signed)
Pt here for Gardasil injection. Injection given in left deltoid.

## 2018-11-07 ENCOUNTER — Ambulatory Visit (INDEPENDENT_AMBULATORY_CARE_PROVIDER_SITE_OTHER): Payer: Medicaid Other

## 2018-11-07 ENCOUNTER — Other Ambulatory Visit: Payer: Self-pay

## 2018-11-07 ENCOUNTER — Other Ambulatory Visit (HOSPITAL_COMMUNITY)
Admission: RE | Admit: 2018-11-07 | Discharge: 2018-11-07 | Disposition: A | Discharge status: Home / Self Care | Payer: Medicaid Other | Source: Ambulatory Visit | Attending: Family Medicine | Admitting: Family Medicine

## 2018-11-07 VITALS — BP 117/75 | HR 80 | Ht 70.0 in | Wt 214.0 lb

## 2018-11-07 DIAGNOSIS — N898 Other specified noninflammatory disorders of vagina: Secondary | ICD-10-CM | POA: Diagnosis present

## 2018-11-07 DIAGNOSIS — R399 Unspecified symptoms and signs involving the genitourinary system: Secondary | ICD-10-CM

## 2018-11-07 LAB — POCT URINALYSIS DIPSTICK
Glucose, UA: NEGATIVE
Nitrite, UA: NEGATIVE
Protein, UA: POSITIVE — AB
Spec Grav, UA: 1.015 (ref 1.010–1.025)
pH, UA: 6.5 (ref 5.0–8.0)

## 2018-11-07 MED ORDER — NITROFURANTOIN MONOHYD MACRO 100 MG PO CAPS: 100.0000 mg | ORAL_CAPSULE | Freq: Two times a day (BID) | ORAL | 0 refills | Status: DC

## 2018-11-07 NOTE — Progress Notes (Signed)
Pt states that she has been having frequent urination and pain on right side.Pt states that she has also been having discharge with odor. UA showed large white cells and a trace of blood.Macrobid 100mg  BID x 5 days was sent to pt's pharmacy. Wet prep and urine culture was sent to the lab.  Raihana Balderrama l Kayen Grabel, CMA

## 2018-11-07 NOTE — Progress Notes (Signed)
Patient seen and assessed by nursing staff.  Agree with documentation and plan.  

## 2018-11-08 ENCOUNTER — Telehealth: Payer: Self-pay

## 2018-11-08 MED ORDER — FLUCONAZOLE 150 MG PO TABS: 150.0000 mg | ORAL_TABLET | Freq: Once | ORAL | 1 refills | Status: DC | PRN

## 2018-11-08 NOTE — Telephone Encounter (Signed)
Patient called stating that she was evaluated yesterday for UTI and was prescribed antibiotic. Patient states she does get yeast infections easily and since this is a long holiday weekend she is worried about getting one over the weekend. Will prescribed per protocol and patient will only fill if she has itching, cottage like discharge. Kathrene Alu RN

## 2018-11-10 LAB — URINE CULTURE

## 2018-11-12 LAB — CERVICOVAGINAL ANCILLARY ONLY
Bacterial vaginitis: POSITIVE — AB
Candida vaginitis: NEGATIVE

## 2018-11-13 MED ORDER — METRONIDAZOLE 500 MG PO TABS: 500.0000 mg | ORAL_TABLET | Freq: Two times a day (BID) | ORAL | 0 refills | Status: DC

## 2018-11-13 NOTE — Addendum Note (Signed)
Addended by: Donnamae Jude on: 11/13/2018 08:01 AM   Modules accepted: Orders

## 2018-11-14 ENCOUNTER — Telehealth: Payer: Self-pay

## 2018-11-14 NOTE — Telephone Encounter (Signed)
-----   Message from Donnamae Jude, MD sent at 11/13/2018  8:01 AM EDT ----- Has BV--rx sent in

## 2018-11-14 NOTE — Telephone Encounter (Signed)
Called pt to inform her of positive BV results. Left message for pt to call the office back. chiquita l wilson, CMA

## 2019-01-09 ENCOUNTER — Ambulatory Visit: Payer: Medicaid Other | Admitting: Obstetrics & Gynecology

## 2019-02-07 ENCOUNTER — Other Ambulatory Visit: Payer: Self-pay | Admitting: Emergency Medicine

## 2019-02-07 DIAGNOSIS — Z20822 Contact with and (suspected) exposure to covid-19: Secondary | ICD-10-CM

## 2019-02-11 ENCOUNTER — Ambulatory Visit: Payer: Medicaid Other | Admitting: Obstetrics & Gynecology

## 2019-02-12 LAB — NOVEL CORONAVIRUS, NAA: SARS-CoV-2, NAA: NOT DETECTED

## 2019-02-28 ENCOUNTER — Other Ambulatory Visit: Payer: Self-pay

## 2019-02-28 ENCOUNTER — Encounter: Payer: Self-pay | Admitting: Family Medicine

## 2019-02-28 ENCOUNTER — Ambulatory Visit (INDEPENDENT_AMBULATORY_CARE_PROVIDER_SITE_OTHER): Payer: Medicaid Other | Admitting: Family Medicine

## 2019-02-28 VITALS — BP 123/79 | HR 91 | Ht 69.5 in

## 2019-02-28 DIAGNOSIS — Z1239 Encounter for other screening for malignant neoplasm of breast: Secondary | ICD-10-CM

## 2019-02-28 DIAGNOSIS — N939 Abnormal uterine and vaginal bleeding, unspecified: Secondary | ICD-10-CM | POA: Diagnosis not present

## 2019-02-28 MED ORDER — MEGESTROL ACETATE 40 MG PO TABS: 40.0000 mg | ORAL_TABLET | Freq: Every day | ORAL | 5 refills | Status: DC

## 2019-02-28 NOTE — Progress Notes (Signed)
   Subjective:    Patient ID: Gabriella Snyder, female    DOB: 1976-02-12, 43 y.o.   MRN: [id number]  HPI Patient seen for AUB/IMB. Has periods every 10 days, worsening over 2 months. Bleeds for a few days, then stops. Heavy bleeding with clots.   Review of Systems     Objective:   Physical Exam Exam conducted with a chaperone present.  Constitutional:      Appearance: Normal appearance.  Pulmonary:     Effort: Pulmonary effort is normal.  Abdominal:     General: Abdomen is flat.     Palpations: Abdomen is soft.     Hernia: There is no hernia in the left inguinal area or right inguinal area.  Genitourinary:    Labia:        Right: No rash, tenderness, lesion or injury.        Left: No rash, tenderness, lesion or injury.      Vagina: No signs of injury and foreign body. No vaginal discharge, erythema, tenderness or bleeding.     Cervix: No cervical motion tenderness, discharge, friability, lesion, erythema or cervical bleeding.     Uterus: Enlarged (10 week size). Not deviated, not fixed and no uterine prolapse.      Adnexa: Right adnexa normal and left adnexa normal.       Right: No mass, tenderness or fullness.         Left: No mass, tenderness or fullness.    Lymphadenopathy:     Lower Body: No right inguinal adenopathy. No left inguinal adenopathy.  Skin:    Capillary Refill: Capillary refill takes less than 2 seconds.  Neurological:     Mental Status: She is alert.  Psychiatric:        Mood and Affect: Mood normal.        Behavior: Behavior normal.        Thought Content: Thought content normal.        Judgment: Judgment normal.        Assessment & Plan:  1. Abnormal uterine bleeding (AUB) Start progesterone 40mg  Daily. Will get [place] to evaluate fibroids. May need endometrial sampling if bleeding not controlled. - US PELVIS TRANSVAGINAL NON-OB (TV ONLY); Future  2. Breast cancer screening, high risk patient - MM DIGITAL SCREENING BILATERAL; Future

## 2019-02-28 NOTE — Progress Notes (Signed)
Patient states she is having bleeding between her periods. Patient states she is having bleeding almost every ten days. Kathrene Alu RN

## 2019-03-11 ENCOUNTER — Ambulatory Visit (HOSPITAL_BASED_OUTPATIENT_CLINIC_OR_DEPARTMENT_OTHER): Admission: RE | Admit: 2019-03-11 | Payer: Medicaid Other | Source: Ambulatory Visit

## 2019-03-11 ENCOUNTER — Inpatient Hospital Stay (HOSPITAL_BASED_OUTPATIENT_CLINIC_OR_DEPARTMENT_OTHER): Admission: RE | Admit: 2019-03-11 | Payer: Medicaid Other | Source: Ambulatory Visit

## 2019-03-19 ENCOUNTER — Other Ambulatory Visit (HOSPITAL_BASED_OUTPATIENT_CLINIC_OR_DEPARTMENT_OTHER): Payer: Medicaid Other

## 2019-03-19 ENCOUNTER — Ambulatory Visit (HOSPITAL_BASED_OUTPATIENT_CLINIC_OR_DEPARTMENT_OTHER): Payer: Medicaid Other

## 2019-04-01 ENCOUNTER — Other Ambulatory Visit: Payer: Self-pay

## 2019-04-01 ENCOUNTER — Emergency Department (HOSPITAL_COMMUNITY)
Admission: EM | Admit: 2019-04-01 | Discharge: 2019-04-01 | Disposition: A | Discharge status: Home / Self Care | Payer: Medicaid Other | Attending: Emergency Medicine | Admitting: Emergency Medicine

## 2019-04-01 ENCOUNTER — Encounter (HOSPITAL_COMMUNITY): Payer: Self-pay

## 2019-04-01 DIAGNOSIS — R110 Nausea: Secondary | ICD-10-CM | POA: Diagnosis present

## 2019-04-01 DIAGNOSIS — Z5321 Procedure and treatment not carried out due to patient leaving prior to being seen by health care provider: Secondary | ICD-10-CM | POA: Diagnosis not present

## 2019-04-01 LAB — COMPREHENSIVE METABOLIC PANEL
ALT: 8 U/L (ref 0–44)
AST: 19 U/L (ref 15–41)
Albumin: 4.4 g/dL (ref 3.5–5.0)
Alkaline Phosphatase: 58 U/L (ref 38–126)
Anion gap: 9 (ref 5–15)
BUN: 11 mg/dL (ref 6–20)
CO2: 20 mmol/L — ABNORMAL LOW (ref 22–32)
Calcium: 9.3 mg/dL (ref 8.9–10.3)
Chloride: 107 mmol/L (ref 98–111)
Creatinine, Ser: 0.98 mg/dL (ref 0.44–1.00)
GFR calc Af Amer: >60 mL/min (ref >60)
GFR calc non Af Amer: >60 mL/min (ref >60)
Glucose, Bld: 153 mg/dL — ABNORMAL HIGH (ref 70–99)
Potassium: 3.9 mmol/L (ref 3.5–5.1)
Sodium: 136 mmol/L (ref 135–145)
Total Bilirubin: 0.6 mg/dL (ref 0.3–1.2)
Total Protein: 8.4 g/dL — ABNORMAL HIGH (ref 6.5–8.1)

## 2019-04-01 LAB — CBC
HCT: 34.8 % — ABNORMAL LOW (ref 36.0–46.0)
Hemoglobin: 10.6 g/dL — ABNORMAL LOW (ref 12.0–15.0)
MCH: 22.8 pg — ABNORMAL LOW (ref 26.0–34.0)
MCHC: 30.5 g/dL (ref 30.0–36.0)
MCV: 74.8 fL — ABNORMAL LOW (ref 80.0–100.0)
Platelets: 238 10*3/uL (ref 150–400)
RBC: 4.65 MIL/uL (ref 3.87–5.11)
RDW: 21.2 % — ABNORMAL HIGH (ref 11.5–15.5)
WBC: 9 10*3/uL (ref 4.0–10.5)
nRBC: 0 % (ref 0.0–0.2)

## 2019-04-01 LAB — LIPASE, BLOOD: Lipase: 26 U/L (ref 11–51)

## 2019-04-01 LAB — I-STAT BETA HCG BLOOD, ED (MC, WL, AP ONLY): I-stat hCG, quantitative: <5 m[IU]/mL (ref <5)

## 2019-04-01 MED ORDER — SODIUM CHLORIDE 0.9% FLUSH: 3.0000 mL | Freq: Once | INTRAVENOUS | Status: DC

## 2019-04-01 NOTE — ED Notes (Signed)
Sent culture down with urine sample.

## 2019-04-01 NOTE — ED Notes (Signed)
Lab called and stated urine spilled in bag and they need another sample. Advised pt of same.

## 2019-04-01 NOTE — ED Triage Notes (Signed)
Pt presents with c/o nausea and diarrhea for approx 4 days. Pt reports she does have her tubes tied so she does not believe that she is pregnant. Pt is concerned as to whether the nausea and diarrhea are Covid symptoms are not. Denies being around anyone Covid positive.

## 2019-04-25 ENCOUNTER — Encounter: Payer: Medicaid Other | Admitting: Obstetrics & Gynecology

## 2019-04-26 ENCOUNTER — Encounter: Payer: Self-pay | Admitting: Obstetrics & Gynecology

## 2019-04-26 ENCOUNTER — Other Ambulatory Visit: Payer: Self-pay

## 2019-04-26 ENCOUNTER — Encounter: Payer: Medicaid Other | Admitting: Obstetrics & Gynecology

## 2019-04-26 NOTE — Progress Notes (Signed)
Pt arrived 1 hour and 45 min late then left without being seen. Will have office call pt to reschedule.   Kanchan Gal L. Harraway-Smith, M.D., Cherlynn June

## 2019-05-23 ENCOUNTER — Other Ambulatory Visit (HOSPITAL_COMMUNITY): Payer: Self-pay | Admitting: Family Medicine

## 2019-05-23 DIAGNOSIS — R079 Chest pain, unspecified: Secondary | ICD-10-CM

## 2019-06-03 ENCOUNTER — Other Ambulatory Visit: Payer: Self-pay

## 2019-06-03 ENCOUNTER — Ambulatory Visit (HOSPITAL_BASED_OUTPATIENT_CLINIC_OR_DEPARTMENT_OTHER)
Admission: RE | Admit: 2019-06-03 | Discharge: 2019-06-03 | Disposition: A | Discharge status: Home / Self Care | Payer: Medicaid Other | Source: Ambulatory Visit | Attending: Family Medicine | Admitting: Family Medicine

## 2019-06-03 ENCOUNTER — Encounter: Payer: Self-pay | Admitting: Obstetrics & Gynecology

## 2019-06-03 ENCOUNTER — Ambulatory Visit (INDEPENDENT_AMBULATORY_CARE_PROVIDER_SITE_OTHER): Payer: Medicaid Other | Admitting: Obstetrics & Gynecology

## 2019-06-03 ENCOUNTER — Other Ambulatory Visit (HOSPITAL_COMMUNITY)
Admission: RE | Admit: 2019-06-03 | Discharge: 2019-06-03 | Disposition: A | Discharge status: Home / Self Care | Payer: Medicaid Other | Source: Ambulatory Visit | Attending: Obstetrics & Gynecology | Admitting: Obstetrics & Gynecology

## 2019-06-03 VITALS — BP 123/61 | HR 68 | Ht 70.0 in | Wt 226.0 lb

## 2019-06-03 DIAGNOSIS — Z1239 Encounter for other screening for malignant neoplasm of breast: Secondary | ICD-10-CM | POA: Diagnosis present

## 2019-06-03 DIAGNOSIS — Z01419 Encounter for gynecological examination (general) (routine) without abnormal findings: Secondary | ICD-10-CM | POA: Insufficient documentation

## 2019-06-03 DIAGNOSIS — D251 Intramural leiomyoma of uterus: Secondary | ICD-10-CM

## 2019-06-03 DIAGNOSIS — N939 Abnormal uterine and vaginal bleeding, unspecified: Secondary | ICD-10-CM | POA: Insufficient documentation

## 2019-06-03 DIAGNOSIS — Z0000 Encounter for general adult medical examination without abnormal findings: Secondary | ICD-10-CM

## 2019-06-03 LAB — RESULTS CONSOLE HPV: CHL HPV: NEGATIVE

## 2019-06-03 MED ORDER — NORETHIN ACE-ETH ESTRAD-FE 1-20 MG-MCG(24) PO TABS: 1.0000 | ORAL_TABLET | Freq: Every day | ORAL | 11 refills | Status: DC

## 2019-06-03 NOTE — Progress Notes (Signed)
Subjective:     Gabriella Snyder is a 44 y.o. female here for a routine exam.  Current complaints: Pt reports spotting between menses. Pt has a h/o trich but, has no assoc sx. She was given a Rx form progestins but, she did not take it as she was worried about mood changes assoc with the meds.   Pt reports a h/o uterine fibroids. She denies pelvic pain.   Gynecologic History Patient's last menstrual period was 05/28/2019 (exact date). Contraception: tubal ligation Last Pap: 01/2018. Results were: normal Last mammogram: 06/03/2019. Results were: results pending  Obstetric History OB History  Gravida Para Term Preterm AB Living  8 5 5  0 2 5  SAB TAB Ectopic Multiple Live Births  1 0 0 0      # Outcome Date GA Lbr Len/2nd Weight Sex Delivery Anes PTL Lv  8 SAB           7 AB           6 Term      Vag-Spont     5 Term      Vag-Spont     4 Term      Vag-Spont     3 Term      Vag-Spont     2 Term      CS-Unspec     1 Gravida             The following portions of the patient's history were reviewed and updated as appropriate: allergies, current medications, past family history, past medical history, past social history, past surgical history and problem list.  Review of Systems Pertinent items are noted in HPI.    Objective:  BP 123/61   Pulse 68   Ht 5\' 10"  (1.778 m)   Wt 226 lb (102.5 kg)   LMP 05/28/2019 (Exact Date)   BMI 32.43 kg/m   General Appearance:    Alert, cooperative, no distress, appears stated age  Head:    Normocephalic, without obvious abnormality, atraumatic  Eyes:    conjunctiva/corneas clear, EOM's intact, both eyes  Ears:    Normal external ear canals, both ears  Nose:   Nares normal, septum midline, mucosa normal, no drainage    or sinus tenderness  Throat:   Lips, mucosa, and tongue normal; teeth and gums normal  Neck:   Supple, symmetrical, trachea midline, no adenopathy;    thyroid:  no enlargement/tenderness/nodules  Back:     Symmetric, no  curvature, ROM normal, no CVA tenderness  Lungs:     respirations unlabored  Chest Wall:    No tenderness or deformity   Heart:    Regular rate and rhythm  Breast Exam:    No tenderness, masses, or nipple abnormality  Abdomen:     Soft, non-tender, bowel sounds active all four quadrants,    no masses, no organomegaly; well healed transverse incision  Genitalia:    Normal female without lesion, discharge or tenderness; uterus: small mobile ~10 weeks sized.      Extremities:   Extremities normal, atraumatic, no cyanosis or edema  Pulses:   2+ and symmetric all extremities  Skin:   Skin color, texture, turgor normal, no rashes or lesions   06/03/2019 CLINICAL DATA:  Dysfunctional uterine bleeding  EXAM: ULTRASOUND PELVIS TRANSVAGINAL  TECHNIQUE: Transvaginal ultrasound examination of the pelvis was performed including evaluation of the uterus, ovaries, adnexal regions, and pelvic cul-de-sac.  COMPARISON:  December 13, 2017  FINDINGS: Uterus  Measurements: 9.9  x 8.6 x 8.3 cm = volume: 368.6 mL. The uterus has inhomogeneous echotexture throughout the myometrium. There is a mixed echogenicity mass along the anterior superior fundus measuring 4.3 x 4.4 x 4.9 cm. Slightly more posteriorly in the superior fundus region, there is a primarily hypoechoic mass measuring 2.6 x 2.6 x 2.9 cm. There are apparent nabothian cysts arising from the cervix. The largest of these cysts measures 1.0 x 0.8 cm and contains debris. A smaller similar appearing cyst measures 6 x 6 mm. A third cyst measures 6 x 5 mm.  Endometrium  Thickness: 5 mm.  No focal abnormality visualized.  Right ovary  Measurements: 3.4 x 1.6 x 1.7 cm = volume: 4.7 mL. Normal appearance/no adnexal mass.  Left ovary  Measurements: 2.8 x 2.9 x 3.2 cm = volume: 13.6 mL. There is an apparent follicle arising from the left ovary measuring 2.2 x 1.8 x 1.6 cm. No other extrauterine pelvic mass.  Other findings:  No  abnormal free fluid  IMPRESSION: 1.Leiomyomatous appearing uterus with 2 well-defined superior fundal leiomyomas. These masses were present previously but appears slightly larger currently.  2.  Cervical nabothian cysts noted.  3. Endometrial thickness within normal limits. Endometrial contour smooth.  4. Apparent follicle arising from left ovary. No other extrauterine pelvic mass. No free pelvic fluid.  Assessment:    Healthy female exam.   AUB- spotting between menses. US reveal fibroids that are slightly larger than prev. Reviewed options for management. Pt would like to try OCPs. Would like to avoid surgical management at present.    Plan:   F/u PAP and cx LoEstrin 1 po q day F/u results of mammogram from this am F/u in 3 months or sooner prn  Malikiah Debarr L. Harraway-Smith, M.D., Cherlynn June

## 2019-06-03 NOTE — Patient Instructions (Signed)
Abnormal Uterine Bleeding °Abnormal uterine bleeding is unusual bleeding from the uterus. It includes: °· Bleeding or spotting between periods. °· Bleeding after sex. °· Bleeding that is heavier than normal. °· Periods that last longer than usual. °· Bleeding after menopause. °Abnormal uterine bleeding can affect women at various stages in life, including teenagers, women in their reproductive years, pregnant women, and women who have reached menopause. Common causes of abnormal uterine bleeding include: °· Pregnancy. °· Growths of tissue (polyps). °· A noncancerous tumor in the uterus (fibroid). °· Infection. °· Cancer. °· Hormonal imbalances. °Any type of abnormal bleeding should be evaluated by a health care provider. Many cases are minor and simple to treat, while others are more serious. Treatment will depend on the cause of the bleeding. °Follow these instructions at home: °· Monitor your condition for any changes. °· Do not use tampons, douche, or have sex if told by your health care provider. °· Change your pads often. °· Get regular exams that include pelvic exams and cervical cancer screening. °· Keep all follow-up visits as told by your health care provider. This is important. °Contact a health care provider if: °· Your bleeding lasts for more than one week. °· You feel dizzy at times. °· You feel nauseous or you vomit. °Get help right away if: °· You pass out. °· Your bleeding soaks through a pad every hour. °· You have abdominal pain. °· You have a fever. °· You become sweaty or weak. °· You pass large blood clots from your vagina. °Summary °· Abnormal uterine bleeding is unusual bleeding from the uterus. °· Any type of abnormal bleeding should be evaluated by a health care provider. Many cases are minor and simple to treat, while others are more serious. °· Treatment will depend on the cause of the bleeding. °This information is not intended to replace advice given to you by your health care provider.  Make sure you discuss any questions you have with your health care provider. °Document Revised: 08/02/2017 Document Reviewed: 05/27/2016 °Elsevier Patient Education © 2020 [employer] ° °

## 2019-06-03 NOTE — Addendum Note (Signed)
Addended by: Wendelyn Breslow L on: 06/03/2019 11:38 AM   Modules accepted: Orders

## 2019-06-04 LAB — CERVICOVAGINAL ANCILLARY ONLY
Chlamydia: NEGATIVE
Comment: NEGATIVE
Comment: NORMAL
Neisseria Gonorrhea: NEGATIVE

## 2019-06-04 LAB — CYTOLOGY - PAP
Comment: NEGATIVE
Diagnosis: NEGATIVE
High risk HPV: NEGATIVE

## 2019-06-23 ENCOUNTER — Encounter (HOSPITAL_COMMUNITY): Payer: Self-pay | Admitting: *Deleted

## 2019-06-23 ENCOUNTER — Emergency Department (HOSPITAL_COMMUNITY)
Admission: EM | Admit: 2019-06-23 | Discharge: 2019-06-23 | Disposition: A | Discharge status: Home / Self Care | Payer: Medicaid Other | Attending: Emergency Medicine | Admitting: Emergency Medicine

## 2019-06-23 ENCOUNTER — Other Ambulatory Visit: Payer: Self-pay

## 2019-06-23 ENCOUNTER — Emergency Department (HOSPITAL_COMMUNITY): Payer: Medicaid Other

## 2019-06-23 DIAGNOSIS — M546 Pain in thoracic spine: Secondary | ICD-10-CM

## 2019-06-23 DIAGNOSIS — F17210 Nicotine dependence, cigarettes, uncomplicated: Secondary | ICD-10-CM | POA: Insufficient documentation

## 2019-06-23 MED ORDER — DEXAMETHASONE 4 MG PO TABS: 4.0000 mg | ORAL_TABLET | Freq: Two times a day (BID) | ORAL | 0 refills | Status: DC

## 2019-06-23 MED ORDER — DIAZEPAM 5 MG PO TABS
5.0000 mg | ORAL_TABLET | Freq: Once | ORAL | Status: AC
  Administered 2019-06-23: 5 mg via ORAL
  Filled 2019-06-23: qty 1

## 2019-06-23 MED ORDER — GABAPENTIN 300 MG PO CAPS: 300.0000 mg | ORAL_CAPSULE | Freq: Three times a day (TID) | ORAL | 0 refills | Status: DC

## 2019-06-23 MED ORDER — KETOROLAC TROMETHAMINE 15 MG/ML IJ SOLN
15.0000 mg | Freq: Once | INTRAMUSCULAR | Status: AC
  Administered 2019-06-23: 08:00:00 15 mg via INTRAMUSCULAR
  Filled 2019-06-23: qty 1

## 2019-06-23 MED ORDER — GABAPENTIN 300 MG PO CAPS
300.0000 mg | ORAL_CAPSULE | Freq: Once | ORAL | Status: AC
  Administered 2019-06-23: 08:00:00 300 mg via ORAL
  Filled 2019-06-23: qty 1

## 2019-06-23 MED ORDER — DEXAMETHASONE 4 MG PO TABS
8.0000 mg | ORAL_TABLET | Freq: Once | ORAL | Status: AC
  Administered 2019-06-23: 08:00:00 8 mg via ORAL
  Filled 2019-06-23: qty 2

## 2019-06-23 NOTE — ED Notes (Signed)
MRI tech on call, called, no answer, left voicemail

## 2019-06-23 NOTE — ED Triage Notes (Signed)
Pt states she has been having upper back pain for over a month, has been treated by PCP, Muscle relaxer and pain pills have not helped. States her PCP wants her to get a MRI, problems with Medicaid in getting it approved. States pain feels hot in upper left shoulder blade area.

## 2019-06-27 NOTE — ED Provider Notes (Signed)
[facility] DEPT Provider Note   CSN: [id number] Arrival date & time: 06/23/19  [id number]     History Chief Complaint  Patient presents with  . Back Pain    Gabriella Snyder is a 44 y.o. female.  HPI   44 year old female with mid to upper back pain.  Radiation up into the left shoulder blade.  Sharp.  Sometimes worse with movement.  Pins and needle sensation at times.  No weakness.  She has been treated symptomatically with her PCP.  Has been using NSAIDs, pain medication and muscle relaxants without much improvement.  She states that this makes her feel sleepy.  Pain has been progressively worsening.  Denies any acute trauma or strain.  Does not feel short of breath.  Past Medical History:  Diagnosis Date  . Anemia   . Anxiety   . Depression   . Heart murmur    "when I was younger; it closed up"  . History of blood transfusion 1993   "related to the C-section  . Migraine    "a couple times/yr" (02/25/2014)  . Muscle spasm   . Pyelonephritis 02/25/2014    Patient Active Problem List   Diagnosis Date Noted  . Trichomonal vaginitis 12/26/2017  . Morbid obesity ([place]) 02/17/2017  . DUB (dysfunctional uterine bleeding) 02/17/2017  . Yeast vaginitis 02/17/2017  . BV (bacterial vaginosis) 02/27/2014  . Iron deficiency anemia 02/27/2014  . Pyelonephritis 02/25/2014  . Acute pyelonephritis 02/25/2014    Past Surgical History:  Procedure Laterality Date  . CESAREAN SECTION  1993  . DILATION AND CURETTAGE OF UTERUS  2003  . TUBAL LIGATION  2011  . TUMOR EXCISION Right ~ 2004   "off my back"     OB History    Gravida  8   Para  5   Term  5   Preterm  0   AB  2   Living  5     SAB  1   TAB  0   Ectopic  0   Multiple  0   Live Births              Family History  Problem Relation Age of Onset  . Diabetes Mother   . Hypertension Father   . Cancer Maternal Grandfather   . Cancer Maternal Aunt        cervical  .  Cancer Cousin        lymphoma  . Stroke Paternal Grandfather     Social History   Tobacco Use  . Smoking status: Current Every Day Smoker    Packs/day: 0.25    Years: 6.00    Pack years: 1.50    Types: Cigarettes  . Smokeless tobacco: Never Used  Substance Use Topics  . Alcohol use: Yes    Comment: 02/25/2014 "don't really drink; might have a drink q now and then"  . Drug use: Yes    Types: Marijuana    Comment: 02/25/2014 "usually once/day; nothing in the last week"    Home Medications Prior to Admission medications   Medication Sig Start Date End Date Taking? Authorizing Provider  ascorbic acid (VITAMIN C) 500 MG tablet Take 500 mg by mouth daily.   Yes [provider]  cyclobenzaprine (FLEXERIL) 5 MG tablet Take 1 tablet (5 mg total) by mouth 3 (three) times daily as needed for muscle spasms. Patient taking differently: Take 5 mg by mouth 2 (two) times daily as needed for muscle spasms.  04/16/16  Yes Domenic Moras, PA-C  ergocalciferol (VITAMIN D2) 1.25 MG (50000 UT) capsule Take 50,000 Units by mouth once a week.   Yes [provider]  ferrous sulfate 325 (65 FE) MG tablet Take 1 tablet (325 mg total) by mouth daily. 12/17/15  Yes Jorje Guild, NP  ibuprofen (ADVIL) 800 MG tablet Take 800 mg by mouth every 8 (eight) hours as needed for mild pain or moderate pain.   Yes [provider]  Multiple Vitamin (MULTIVITAMIN) tablet Take 1 tablet by mouth daily.   Yes [provider]  Norethindrone Acetate-Ethinyl Estrad-FE (LOESTRIN 24 FE) 1-20 MG-MCG(24) tablet Take 1 tablet by mouth daily. Patient taking differently: Take 1 tablet by mouth every evening.  06/03/19  Yes Lavonia Drafts, MD  oxycodone-acetaminophen (LYNOX) 10-300 MG tablet Take 1 tablet by mouth every 4 (four) hours as needed for pain.   Yes [provider]  zinc gluconate 50 MG tablet Take 50 mg by mouth daily.   Yes [provider]  dexamethasone (DECADRON)  4 MG tablet Take 1 tablet (4 mg total) by mouth 2 (two) times daily. 06/23/19   Virgel Manifold, MD  gabapentin (NEURONTIN) 300 MG capsule Take 1 capsule (300 mg total) by mouth 3 (three) times daily. 06/23/19   Virgel Manifold, MD  ondansetron (ZOFRAN ODT) 4 MG disintegrating tablet 4mg  ODT q4 hours prn nausea/vomit Patient not taking: Reported on 12/26/2017 12/13/17   Jacqlyn Larsen, PA-C    Allergies    Patient has no known allergies.  Review of Systems   Review of Systems All systems reviewed and negative, other than as noted in HPI.  Physical Exam Updated Vital Signs BP 138/88   Pulse 68   Temp 98.1 F (36.7 C) (Oral)   Resp 16   Ht 5\' 10"  (1.778 m)   Wt 102.5 kg   LMP 05/28/2019 (Exact Date)   SpO2 100%   BMI 32.43 kg/m   Physical Exam Vitals and nursing note reviewed.  Constitutional:      General: She is not in acute distress.    Appearance: She is well-developed.  HENT:     Head: Normocephalic and atraumatic.  Eyes:     General:        Right eye: No discharge.        Left eye: No discharge.     Conjunctiva/sclera: Conjunctivae normal.  Cardiovascular:     Rate and Rhythm: Normal rate and regular rhythm.     Heart sounds: Normal heart sounds. No murmur. No friction rub. No gallop.   Pulmonary:     Effort: Pulmonary effort is normal. No respiratory distress.     Breath sounds: Normal breath sounds.  Abdominal:     General: There is no distension.     Palpations: Abdomen is soft.     Tenderness: There is no abdominal tenderness.  Musculoskeletal:        General: Tenderness present.     Cervical back: Neck supple.     Comments: Back and shoulder grossly normal in appearance.  No concerning skin changes noted.  Tenderness to pain palpation left scapular region extending to the posterior left shoulder.  Increased pain with both passive and active abduction of the left shoulder.  No midline spinal tenderness.  Neurovascular intact in the left upper extremity.  Skin:     General: Skin is warm and dry.  Neurological:     Mental Status: She is alert.  Psychiatric:  Behavior: Behavior normal.        Thought Content: Thought content normal.     ED Results / Procedures / Treatments   Labs (all labs ordered are listed, but only abnormal results are displayed) Labs Reviewed - No data to display  EKG None  Radiology No results found.   MR THORACIC SPINE WO CONTRAST  Result Date: 06/23/2019 CLINICAL DATA:  Left back pain in between the shoulder blades. EXAM: MRI THORACIC SPINE WITHOUT CONTRAST TECHNIQUE: Multiplanar, multisequence MR imaging of the thoracic spine was performed. No intravenous contrast was administered. COMPARISON:  Thoracic spine radiographs from 01/16/2008 FINDINGS: Despite efforts by the technologist and patient, motion artifact is present on today's exam and could not be eliminated. This reduces exam sensitivity and specificity. Alignment:  No vertebral subluxation is observed. Vertebrae: Modic type 2 degenerative endplate findings observed at T4-5 and along the superior endplate of T6. There are several small thoracic hemangiomas, one of the largest of which is a 0.6 cm hemangioma along the upper portion of the T9 vertebral body. No significant vertebral marrow edema is identified. Reasonably preserved intervertebral disc signal. Cord:  No significant abnormal spinal cord signal is observed. Paraspinal and other soft tissues: Unremarkable Disc levels: No significant findings above the T6-7 level. T6-7: No impingement. Minimal disc bulge. T7-8: Unremarkable. T8-9: Unremarkable. T9-10: No impingement. Mild right degenerative facet arthropathy. T10-11: No impingement. Mild disc bulge and mild degenerative left facet arthropathy. T11-12: No impingement. Suspected mild bilateral degenerative facet arthropathy. T12-L1: Unremarkable. IMPRESSION: 1. 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 2. Several  small thoracic hemangiomas are incidentally noted, one of which is a 0.6 cm hemangioma along the upper portion of the T9 vertebral body. 3. Modic type 2 degenerative endplate findings at [id number] and along the superior endplate of T6. 4. Despite efforts by the technologist and patient, motion artifact is present on today's exam and could not be eliminated. This reduces exam sensitivity and specificity. Electronically Signed   By: Van Clines M.D.   On: 06/23/2019 12:08   US PELVIS TRANSVAGINAL NON-OB (TV ONLY)  Result Date: 06/03/2019 CLINICAL DATA:  Dysfunctional uterine bleeding EXAM: ULTRASOUND PELVIS TRANSVAGINAL TECHNIQUE: Transvaginal ultrasound examination of the pelvis was performed including evaluation of the uterus, ovaries, adnexal regions, and pelvic cul-de-sac. COMPARISON:  December 13, 2017 FINDINGS: Uterus Measurements: 9.9 x 8.6 x 8.3 cm = volume: 368.6 mL. The uterus has inhomogeneous echotexture throughout the myometrium. There is a mixed echogenicity mass along the anterior superior fundus measuring 4.3 x 4.4 x 4.9 cm. Slightly more posteriorly in the superior fundus region, there is a primarily hypoechoic mass measuring 2.6 x 2.6 x 2.9 cm. There are apparent nabothian cysts arising from the cervix. The largest of these cysts measures 1.0 x 0.8 cm and contains debris. A smaller similar appearing cyst measures 6 x 6 mm. A third cyst measures 6 x 5 mm. Endometrium Thickness: 5 mm.  No focal abnormality visualized. Right ovary Measurements: 3.4 x 1.6 x 1.7 cm = volume: 4.7 mL. Normal appearance/no adnexal mass. Left ovary Measurements: 2.8 x 2.9 x 3.2 cm = volume: 13.6 mL. There is an apparent follicle arising from the left ovary measuring 2.2 x 1.8 x 1.6 cm. No other extrauterine pelvic mass. Other findings:  No abnormal free fluid IMPRESSION: 1.Leiomyomatous appearing uterus with 2 well-defined superior fundal leiomyomas. These masses were present previously but appears slightly larger  currently. 2.  Cervical nabothian cysts noted. 3. Endometrial thickness within normal  limits. Endometrial contour smooth. 4. Apparent follicle arising from left ovary. No other extrauterine pelvic mass. No free pelvic fluid. Electronically Signed   By: Lowella Grip III M.D.   On: 06/03/2019 10:39   MM 3D SCREEN BREAST BILATERAL  Result Date: 06/04/2019 CLINICAL DATA:  Screening. EXAM: DIGITAL SCREENING BILATERAL MAMMOGRAM WITH TOMO AND CAD COMPARISON:  Previous exam(s). ACR Breast Density Category b: There are scattered areas of fibroglandular density. FINDINGS: There are no findings suspicious for malignancy. Images were processed with CAD. IMPRESSION: No mammographic evidence of malignancy. A result letter of this screening mammogram will be mailed directly to the patient. RECOMMENDATION: Screening mammogram in one year. (Code:SM-B-01Y) BI-RADS CATEGORY  1: Negative. Electronically Signed   By: Abelardo Diesel M.D.   On: 06/04/2019 10:46    Procedures Procedures (including critical care time)  Medications Ordered in ED Medications  ketorolac (TORADOL) 15 MG/ML injection 15 mg (15 mg Intramuscular Given 06/23/19 0825)  gabapentin (NEURONTIN) capsule 300 mg (300 mg Oral Given 06/23/19 0824)  dexamethasone (DECADRON) tablet 8 mg (8 mg Oral Given 06/23/19 0825)  diazepam (VALIUM) tablet 5 mg (5 mg Oral Given 06/23/19 1039)    ED Course  I have reviewed the triage vital signs and the nursing notes.  Pertinent labs & imaging results that were available during my care of the patient were reviewed by me and considered in my medical decision making (see chart for details).    MDM Rules/Calculators/A&P                     44 year old female with persistent nontraumatic left upper back pain.  Sound somewhat neuropathic in description.  MRI without significant acute abnormality.  Neuro exam without focality.  Plan continue symptomatic treatment.  Hopefully course of steroids will give her some  improvement.  Also try Neurontin.  Return precautions were discussed.  Outpatient follow-up with her PCP otherwise.  Final Clinical Impression(s) / ED Diagnoses Final diagnoses:  Acute left-sided thoracic back pain    Rx / DC Orders ED Discharge Orders         Ordered    dexamethasone (DECADRON) 4 MG tablet  2 times daily     06/23/19 1239    gabapentin (NEURONTIN) 300 MG capsule  3 times daily     06/23/19 1239           Virgel Manifold, MD 06/27/19 0945

## 2019-07-01 ENCOUNTER — Encounter (HOSPITAL_BASED_OUTPATIENT_CLINIC_OR_DEPARTMENT_OTHER): Payer: Self-pay | Admitting: *Deleted

## 2019-07-01 ENCOUNTER — Other Ambulatory Visit: Payer: Self-pay

## 2019-07-01 ENCOUNTER — Emergency Department (HOSPITAL_BASED_OUTPATIENT_CLINIC_OR_DEPARTMENT_OTHER)
Admission: EM | Admit: 2019-07-01 | Discharge: 2019-07-01 | Disposition: A | Discharge status: Home / Self Care | Payer: Medicaid Other | Attending: Emergency Medicine | Admitting: Emergency Medicine

## 2019-07-01 DIAGNOSIS — Z79899 Other long term (current) drug therapy: Secondary | ICD-10-CM | POA: Diagnosis not present

## 2019-07-01 DIAGNOSIS — M546 Pain in thoracic spine: Secondary | ICD-10-CM | POA: Insufficient documentation

## 2019-07-01 DIAGNOSIS — F17210 Nicotine dependence, cigarettes, uncomplicated: Secondary | ICD-10-CM | POA: Insufficient documentation

## 2019-07-01 DIAGNOSIS — M79605 Pain in left leg: Secondary | ICD-10-CM

## 2019-07-01 DIAGNOSIS — M722 Plantar fascial fibromatosis: Secondary | ICD-10-CM | POA: Diagnosis not present

## 2019-07-01 DIAGNOSIS — G8929 Other chronic pain: Secondary | ICD-10-CM

## 2019-07-01 MED ORDER — KETOROLAC TROMETHAMINE 30 MG/ML IJ SOLN
30.0000 mg | Freq: Once | INTRAMUSCULAR | Status: AC
  Administered 2019-07-01: 30 mg via INTRAMUSCULAR
  Filled 2019-07-01: qty 1

## 2019-07-01 NOTE — ED Provider Notes (Signed)
[facility] DEPARTMENT Provider Note   CSN: [id number] Arrival date & time: 07/01/19  1320     History Chief Complaint  Patient presents with  . Back Pain    KEELEE Snyder is a 44 y.o. female with a pertinent past medical history of depression, anemia, thoracic degenerative disc disease, who presents today with thoracic back pain.  Patient states that the back pain has worsened having her MRI on 06/23/2019.  She is prescribed oxycodone 10 mg, Flexeril, gabapentin for pain management but states she continues to have pain.  She denies any focal neurologic deficits including weakness, change in sensation, bowel or bladder dysfunction.  She denies any systemic symptoms concerning for infection including fever, shortness of breath, myalgias or arthralgias.  He does admit to associated left leg pain on the medial aspect of her distal leg.  She states that the pain is sharp and tender to palpation.  She also has pain on her right foot particularly when walking.  Recent MRIs on the 14th show significant degenerative disc disease in the middle thoracic region.  HPI     Past Medical History:  Diagnosis Date  . Anemia   . Anxiety   . Depression   . Heart murmur    "when I was younger; it closed up"  . History of blood transfusion 1993   "related to the C-section  . Migraine    "a couple times/yr" (02/25/2014)  . Muscle spasm   . Pyelonephritis 02/25/2014    Patient Active Problem List   Diagnosis Date Noted  . Trichomonal vaginitis 12/26/2017  . Morbid obesity ([place]) 02/17/2017  . DUB (dysfunctional uterine bleeding) 02/17/2017  . Yeast vaginitis 02/17/2017  . BV (bacterial vaginosis) 02/27/2014  . Iron deficiency anemia 02/27/2014  . Pyelonephritis 02/25/2014  . Acute pyelonephritis 02/25/2014    Past Surgical History:  Procedure Laterality Date  . CESAREAN SECTION  1993  . DILATION AND CURETTAGE OF UTERUS  2003  . TUBAL LIGATION  2011  . TUMOR  EXCISION Right ~ 2004   "off my back"     OB History    Gravida  8   Para  5   Term  5   Preterm  0   AB  2   Living  5     SAB  1   TAB  0   Ectopic  0   Multiple  0   Live Births              Family History  Problem Relation Age of Onset  . Diabetes Mother   . Hypertension Father   . Cancer Maternal Grandfather   . Cancer Maternal Aunt        cervical  . Cancer Cousin        lymphoma  . Stroke Paternal Grandfather     Social History   Tobacco Use  . Smoking status: Current Every Day Smoker    Packs/day: 0.25    Years: 6.00    Pack years: 1.50    Types: Cigarettes  . Smokeless tobacco: Never Used  Substance Use Topics  . Alcohol use: Yes    Comment: 02/25/2014 "don't really drink; might have a drink q now and then"  . Drug use: Yes    Types: Marijuana    Comment: 02/25/2014 "usually once/day; nothing in the last week"    Home Medications Prior to Admission medications   Medication Sig Start Date End Date Taking? Authorizing Provider  ascorbic acid (VITAMIN C) 500 MG tablet Take 500 mg by mouth daily.    [provider]  cyclobenzaprine (FLEXERIL) 5 MG tablet Take 1 tablet (5 mg total) by mouth 3 (three) times daily as needed for muscle spasms. Patient taking differently: Take 5 mg by mouth 2 (two) times daily as needed for muscle spasms.  04/16/16   Domenic Moras, PA-C  dexamethasone (DECADRON) 4 MG tablet Take 1 tablet (4 mg total) by mouth 2 (two) times daily. 06/23/19   Virgel Manifold, MD  ergocalciferol (VITAMIN D2) 1.25 MG (50000 UT) capsule Take 50,000 Units by mouth once a week.    [provider]  ferrous sulfate 325 (65 FE) MG tablet Take 1 tablet (325 mg total) by mouth daily. 12/17/15   Jorje Guild, NP  gabapentin (NEURONTIN) 300 MG capsule Take 1 capsule (300 mg total) by mouth 3 (three) times daily. 06/23/19   Virgel Manifold, MD  ibuprofen (ADVIL) 800 MG tablet Take 800 mg by mouth every 8 (eight) hours as needed for  mild pain or moderate pain.    [provider]  Multiple Vitamin (MULTIVITAMIN) tablet Take 1 tablet by mouth daily.    [provider]  Norethindrone Acetate-Ethinyl Estrad-FE (LOESTRIN 24 FE) 1-20 MG-MCG(24) tablet Take 1 tablet by mouth daily. Patient taking differently: Take 1 tablet by mouth every evening.  06/03/19   Lavonia Drafts, MD  ondansetron (ZOFRAN ODT) 4 MG disintegrating tablet 4mg  ODT q4 hours prn nausea/vomit Patient not taking: Reported on 12/26/2017 12/13/17   Jacqlyn Larsen, PA-C  oxycodone-acetaminophen (LYNOX) 10-300 MG tablet Take 1 tablet by mouth every 4 (four) hours as needed for pain.    [provider]  zinc gluconate 50 MG tablet Take 50 mg by mouth daily.    [provider]    Allergies    Patient has no known allergies.  Review of Systems   Review of Systems - All review of systems are negative except what is noted on the HPI.  Physical Exam Updated Vital Signs BP 108/72 (BP Location: Right Arm)   Pulse 63   Temp 97.9 F (36.6 C) (Oral)   Resp 16   Ht 5\' 10"  (1.778 m)   Wt 102.5 kg   LMP 06/30/2019   SpO2 100%   BMI 32.43 kg/m   Physical Exam Constitutional:      General: She is in acute distress.     Appearance: She is not ill-appearing.  HENT:     Head: Normocephalic and atraumatic.  Eyes:     Extraocular Movements: Extraocular movements intact.  Cardiovascular:     Rate and Rhythm: Normal rate.     Pulses: Normal pulses.  Pulmonary:     Effort: Pulmonary effort is normal. No respiratory distress.  Chest:     Chest wall: No tenderness.  Abdominal:     General: There is no distension.     Tenderness: There is no abdominal tenderness.  Musculoskeletal:        General: Tenderness (Midthoracic, left leg and right plantar foot pain) present. Normal range of motion.     Cervical back: Normal range of motion.  Skin:    General: Skin is warm and dry.  Neurological:     General: No focal deficit  present.     Mental Status: She is oriented to person, place, and time. Mental status is at baseline.     Cranial Nerves: No cranial nerve deficit.     Sensory: No sensory  deficit.     Motor: No weakness.     Coordination: Coordination normal.  Psychiatric:        Mood and Affect: Mood normal.        Behavior: Behavior normal.     ED Results / Procedures / Treatments   Labs (all labs ordered are listed, but only abnormal results are displayed) Labs Reviewed - No data to display  EKG None  Radiology No results found.  Procedures Procedures (including critical care time)  Medications Ordered in ED Medications  ketorolac (TORADOL) 30 MG/ML injection 30 mg (has no administration in time range)    ED Course  I have reviewed the triage vital signs and the nursing notes.  Pertinent labs & imaging results that were available during my care of the patient were reviewed by me and considered in my medical decision making (see chart for details).    MDM Rules/Calculators/A&P                      Thoracic degenerative disc disease: Patient recently seen in the outpatient setting for thoracic back pain.  Recent MRI shows significant degenerative disc disease in the mid thoracic region.  Patient's already on maximal dosage of gabapentin, oxycodone 10 mg, and Flexeril for her pain management.  Patient states that her pain is worse today.  Considering her presenting signs and symptoms, she would likely benefit from an outpatient follow-up with neurosurgery to discuss further management of her chronic back pain. -Refer to neurosurgery  Right plantar foot pain concerning for plantar fasciitis: Patient presented with pain on the plantar surface of her right foot concerning for plantar fasciitis.  She states that the pain sometimes makes it difficult for her to walk on her foot.  Patient shows no signs of systemic infection or recent trauma to the area.  Patient would benefit from wearing a  boot to assist with ambulation. -Ordered boot -Counseled patient on conservative management with rest, pain management, stretching, ice - Follow up with PCP.  Left leg pain: Patient presents with tenderness to her left medial leg.  The pain is point tender nonspecific.  There is some concern for a local thrombophlebitis.  Patient was counseled on using warm compresses combined with conservative pain management.  She was instructed to follow-up with her primary care physician in the outpatient setting to further evaluate and manage this condition. - Warm compresses, conservative pain management and follow up with PCP   Final Clinical Impression(s) / ED Diagnoses Final diagnoses:  Chronic midline thoracic back pain  Plantar fasciitis  Pain of left lower extremity    Rx / DC Orders ED Discharge Orders    None       Marianna Payment, MD 07/01/19 [place], Wenda Overland, MD 07/03/19 [phone]

## 2019-07-01 NOTE — ED Triage Notes (Signed)
Back pain. She had an MRI recently. She was given Gabapentin and a steroid. She was feeling better but this am she was unable to stand. Burning pain in her left leg, right foot and upper back.

## 2019-07-01 NOTE — Discharge Instructions (Addendum)
Thank you for letting us participate in your care today.  We discussed back pain and was pain.  Please see the instructions below.  Instructions: I put in a referral for neurosurgery.  Please call their office to make an appointment.  The numbers attached to your discharge paperwork. Please use your current prescription medications for back pain relief. I have ordered you a boot for your right foot.  Please use the boot daily for foot pain For the left leg please apply warm compresses as needed to help deal with the pain.

## 2019-07-01 NOTE — ED Notes (Signed)
ED Provider Little at bedside.

## 2019-07-04 ENCOUNTER — Encounter (HOSPITAL_COMMUNITY): Payer: Self-pay | Admitting: Family Medicine

## 2019-07-09 ENCOUNTER — Encounter (HOSPITAL_COMMUNITY): Payer: Self-pay | Admitting: Family Medicine

## 2019-07-19 ENCOUNTER — Telehealth (HOSPITAL_COMMUNITY): Payer: Self-pay | Admitting: Family Medicine

## 2019-07-19 NOTE — Telephone Encounter (Signed)
Just an FYI. We have made several attempts to contact this patient including sending a letter to schedule or reschedule their Myocardial Perfusion test. We will be removing the patient from the  WQ.   Thank you  07/08/2019- Mailed letter/[facility]  06/17/19 [facility] 06/06/19 [facility] 05/23/19 - [facility] - SB

## 2019-08-05 ENCOUNTER — Telehealth: Payer: Self-pay

## 2019-08-05 NOTE — Telephone Encounter (Signed)
Attempted to reach patient to schedule follow up with Dr. Ihor Dow and patient has mailbox that is full. Kathrene Alu RN

## 2019-10-21 ENCOUNTER — Other Ambulatory Visit (HOSPITAL_COMMUNITY)
Admission: RE | Admit: 2019-10-21 | Discharge: 2019-10-21 | Disposition: A | Discharge status: Home / Self Care | Payer: Medicaid Other | Source: Ambulatory Visit | Attending: Obstetrics & Gynecology | Admitting: Obstetrics & Gynecology

## 2019-10-21 ENCOUNTER — Other Ambulatory Visit: Payer: Self-pay

## 2019-10-21 ENCOUNTER — Ambulatory Visit (HOSPITAL_BASED_OUTPATIENT_CLINIC_OR_DEPARTMENT_OTHER): Payer: Medicaid Other

## 2019-10-21 VITALS — BP 122/82 | HR 89 | Wt 233.0 lb

## 2019-10-21 DIAGNOSIS — N898 Other specified noninflammatory disorders of vagina: Secondary | ICD-10-CM

## 2019-10-21 DIAGNOSIS — R399 Unspecified symptoms and signs involving the genitourinary system: Secondary | ICD-10-CM | POA: Diagnosis not present

## 2019-10-21 LAB — POCT URINALYSIS DIPSTICK
Blood, UA: NEGATIVE
Glucose, UA: NEGATIVE
Ketones, UA: NEGATIVE
Nitrite, UA: NEGATIVE
Spec Grav, UA: 1.015 (ref 1.010–1.025)
pH, UA: 6.5 (ref 5.0–8.0)

## 2019-10-21 NOTE — Progress Notes (Addendum)
Pt presents with UTI symptoms and vaginal discharge.Pt states she has been having itching, vaginal discharge, and back pain x 2 weeks. UA showed moderate white cells. Pt is also requesting STI screening. Self swab and Urine culture was sent to the lab.  Geoffery Aultman l Faryn Sieg, CMA   Attestation of Attending Supervision of CMA/RN: Evaluation and management procedures were performed by the nurse under my supervision and collaboration.  I have reviewed the nursing note and chart, and I agree with the management and plan.  Carolyn L. Harraway-Smith, M.D., Cherlynn June

## 2019-10-23 ENCOUNTER — Telehealth: Payer: Self-pay

## 2019-10-23 ENCOUNTER — Other Ambulatory Visit: Payer: Self-pay | Admitting: Obstetrics & Gynecology

## 2019-10-23 DIAGNOSIS — A5901 Trichomonal vulvovaginitis: Secondary | ICD-10-CM

## 2019-10-23 DIAGNOSIS — B379 Candidiasis, unspecified: Secondary | ICD-10-CM

## 2019-10-23 DIAGNOSIS — B9689 Other specified bacterial agents as the cause of diseases classified elsewhere: Secondary | ICD-10-CM

## 2019-10-23 LAB — CERVICOVAGINAL ANCILLARY ONLY
Bacterial Vaginitis (gardnerella): POSITIVE — AB
Candida Glabrata: NEGATIVE
Candida Vaginitis: NEGATIVE
Chlamydia: NEGATIVE
Comment: NEGATIVE
Comment: NEGATIVE
Comment: NEGATIVE
Comment: NEGATIVE
Comment: NEGATIVE
Comment: NORMAL
Neisseria Gonorrhea: NEGATIVE
Trichomonas: POSITIVE — AB

## 2019-10-23 LAB — URINE CULTURE

## 2019-10-23 MED ORDER — METRONIDAZOLE 500 MG PO TABS: 500.0000 mg | ORAL_TABLET | Freq: Two times a day (BID) | ORAL | 0 refills | Status: DC

## 2019-10-23 MED ORDER — METRONIDAZOLE 500 MG PO TABS: 2000.0000 mg | ORAL_TABLET | Freq: Once | ORAL | 0 refills | Status: AC

## 2019-10-23 MED ORDER — FLUCONAZOLE 150 MG PO TABS: 150.0000 mg | ORAL_TABLET | Freq: Once | ORAL | 0 refills | Status: AC

## 2019-10-23 NOTE — Telephone Encounter (Signed)
Called pt to discuss results. Pt made aware that she does not have a UTI but she tested positive for BV and Trich. Flagyl was sent to pt's pharmacy with 1 refill for pt's partner. Understanding was voiced.  Tamorah Hada l Jarren Para, CMA

## 2019-11-14 ENCOUNTER — Emergency Department (HOSPITAL_COMMUNITY): Payer: Medicaid Other

## 2019-11-14 ENCOUNTER — Other Ambulatory Visit: Payer: Self-pay

## 2019-11-14 ENCOUNTER — Encounter (HOSPITAL_COMMUNITY): Payer: Self-pay

## 2019-11-14 ENCOUNTER — Emergency Department (HOSPITAL_COMMUNITY)
Admission: EM | Admit: 2019-11-14 | Discharge: 2019-11-14 | Disposition: A | Discharge status: Home / Self Care | Payer: Medicaid Other | Attending: Emergency Medicine | Admitting: Emergency Medicine

## 2019-11-14 DIAGNOSIS — Y929 Unspecified place or not applicable: Secondary | ICD-10-CM | POA: Insufficient documentation

## 2019-11-14 DIAGNOSIS — X501XXA Overexertion from prolonged static or awkward postures, initial encounter: Secondary | ICD-10-CM | POA: Diagnosis not present

## 2019-11-14 DIAGNOSIS — Y939 Activity, unspecified: Secondary | ICD-10-CM | POA: Insufficient documentation

## 2019-11-14 DIAGNOSIS — Y999 Unspecified external cause status: Secondary | ICD-10-CM | POA: Diagnosis not present

## 2019-11-14 DIAGNOSIS — F17210 Nicotine dependence, cigarettes, uncomplicated: Secondary | ICD-10-CM | POA: Insufficient documentation

## 2019-11-14 DIAGNOSIS — S62654A Nondisplaced fracture of medial phalanx of right ring finger, initial encounter for closed fracture: Secondary | ICD-10-CM | POA: Diagnosis not present

## 2019-11-14 DIAGNOSIS — S60944A Unspecified superficial injury of right ring finger, initial encounter: Secondary | ICD-10-CM | POA: Diagnosis present

## 2019-11-14 HISTORY — DX: Other cervical disc degeneration, unspecified cervical region: M50.30

## 2019-11-14 MED ORDER — IBUPROFEN 800 MG PO TABS
800.0000 mg | ORAL_TABLET | Freq: Once | ORAL | Status: AC
  Administered 2019-11-14: 800 mg via ORAL
  Filled 2019-11-14: qty 1

## 2019-11-14 NOTE — ED Triage Notes (Addendum)
Patient states that she was trying to keep her daughter from running from the police at the police station and injured her right 4th and 5th finger. Patient has slight swelling to the right hand as well.

## 2019-11-14 NOTE — Discharge Instructions (Addendum)
Please go to your appointment with your primary care provider tomorrow, as scheduled.  I would like you to call the office of Dr. Fredna Dow to schedule appointment for ongoing evaluation management of your fracture.  You may require further evaluation or perhaps even surgical intervention.  Ibuprofen or Tylenol as needed for symptoms of pain.  Return to ED or seek immediate medical attention should you experience any new or worsening symptoms.

## 2019-11-14 NOTE — ED Provider Notes (Signed)
[facility] DEPT Provider Note   CSN: [id number] Arrival date & time: 11/14/19  1138     History Chief Complaint  Patient presents with  . Finger Injury  . hand swelling    Gabriella Snyder is a 44 y.o. female with no relevant past medical history presents to the ED with complaints of finger injury.  Patient reports that Gabriella Snyder was restraining her daughter from running from the police when her shirt "twisted" and Gabriella Snyder immediately experienced symptoms of right hand discomfort, particularly involving her fourth and fifth fingers.  Gabriella Snyder states that her fifth finger is particularly swollen and Gabriella Snyder has limited ability to range her finger due to symptoms of pain.  Gabriella Snyder denies any numbness or weakness, wound, other injury, bleeding disorder, or other symptoms.  HPI     Past Medical History:  Diagnosis Date  . Anemia   . Anxiety   . Degenerative disc disease, cervical   . Depression   . Heart murmur    "when I was younger; it closed up"  . History of blood transfusion 1993   "related to the C-section  . Migraine    "a couple times/yr" (02/25/2014)  . Muscle spasm   . Pyelonephritis 02/25/2014    Patient Active Problem List   Diagnosis Date Noted  . Trichomonal vaginitis 12/26/2017  . Morbid obesity ([place]) 02/17/2017  . DUB (dysfunctional uterine bleeding) 02/17/2017  . Yeast vaginitis 02/17/2017  . BV (bacterial vaginosis) 02/27/2014  . Iron deficiency anemia 02/27/2014  . Pyelonephritis 02/25/2014  . Acute pyelonephritis 02/25/2014    Past Surgical History:  Procedure Laterality Date  . CESAREAN SECTION  1993  . DILATION AND CURETTAGE OF UTERUS  2003  . TUBAL LIGATION  2011  . TUMOR EXCISION Right ~ 2004   "off my back"     OB History    Gravida  8   Para  5   Term  5   Preterm  0   AB  2   Living  5     SAB  1   TAB  0   Ectopic  0   Multiple  0   Live Births              Family History  Problem Relation Age of  Onset  . Diabetes Mother   . Hypertension Father   . Cancer Maternal Grandfather   . Cancer Maternal Aunt        cervical  . Cancer Cousin        lymphoma  . Stroke Paternal Grandfather     Social History   Tobacco Use  . Smoking status: Current Every Day Smoker    Packs/day: 0.25    Years: 6.00    Pack years: 1.50    Types: Cigarettes  . Smokeless tobacco: Never Used  Vaping Use  . Vaping Use: Never used  Substance Use Topics  . Alcohol use: Yes    Comment: 02/25/2014 "don't really drink; might have a drink q now and then"  . Drug use: Yes    Types: Marijuana    Comment: 02/25/2014 "usually once/day; nothing in the last week"    Home Medications Prior to Admission medications   Medication Sig Start Date End Date Taking? Authorizing Provider  ascorbic acid (VITAMIN C) 500 MG tablet Take 500 mg by mouth daily.    [provider]  cyclobenzaprine (FLEXERIL) 5 MG tablet Take 1 tablet (5 mg total) by mouth 3 (three)  times daily as needed for muscle spasms. Patient taking differently: Take 5 mg by mouth 2 (two) times daily as needed for muscle spasms.  04/16/16   Domenic Moras, PA-C  dexamethasone (DECADRON) 4 MG tablet Take 1 tablet (4 mg total) by mouth 2 (two) times daily. Patient not taking: Reported on 10/21/2019 06/23/19   Virgel Manifold, MD  ergocalciferol (VITAMIN D2) 1.25 MG (50000 UT) capsule Take 50,000 Units by mouth once a week.    [provider]  ferrous sulfate 325 (65 FE) MG tablet Take 1 tablet (325 mg total) by mouth daily. Patient not taking: Reported on 10/21/2019 12/17/15   Jorje Guild, NP  gabapentin (NEURONTIN) 300 MG capsule Take 1 capsule (300 mg total) by mouth 3 (three) times daily. 06/23/19   Virgel Manifold, MD  ibuprofen (ADVIL) 800 MG tablet Take 800 mg by mouth every 8 (eight) hours as needed for mild pain or moderate pain.    [provider]  metroNIDAZOLE (FLAGYL) 500 MG tablet Take 1 tablet (500 mg total) by mouth 2 (two)  times daily. 10/23/19   Lavonia Drafts, MD  Multiple Vitamin (MULTIVITAMIN) tablet Take 1 tablet by mouth daily.    [provider]  Norethindrone Acetate-Ethinyl Estrad-FE (LOESTRIN 24 FE) 1-20 MG-MCG(24) tablet Take 1 tablet by mouth daily. Patient not taking: Reported on 10/21/2019 06/03/19   Lavonia Drafts, MD  ondansetron (ZOFRAN ODT) 4 MG disintegrating tablet 4mg  ODT q4 hours prn nausea/vomit Patient not taking: Reported on 12/26/2017 12/13/17   Jacqlyn Larsen, PA-C  oxycodone-acetaminophen (LYNOX) 10-300 MG tablet Take 1 tablet by mouth every 4 (four) hours as needed for pain.    [provider]  zinc gluconate 50 MG tablet Take 50 mg by mouth daily.    [provider]    Allergies    Patient has no known allergies.  Review of Systems   Review of Systems  Musculoskeletal: Positive for arthralgias and joint swelling.  Skin: Negative for wound.  Neurological: Negative for weakness and numbness.  Hematological: Does not bruise/bleed easily.    Physical Exam Updated Vital Signs BP 134/86 (BP Location: Left Arm)   Pulse 98   Temp 99.2 F (37.3 C) (Oral)   Resp 20   Ht 5' 10.5" (1.791 m)   Wt 105.7 kg   LMP 10/15/2019   SpO2 99%   BMI 32.96 kg/m   Physical Exam Vitals and nursing note reviewed. Exam conducted with a chaperone present.  Constitutional:      Appearance: Normal appearance.  HENT:     Head: Normocephalic and atraumatic.  Eyes:     General: No scleral icterus.    Conjunctiva/sclera: Conjunctivae normal.  Pulmonary:     Effort: Pulmonary effort is normal.  Musculoskeletal:     Comments: Right hand, fifth finger: Moderate swelling over distal portion, no fusiform swelling.  Able to flex and extend mildly against resistance.  Capillary refill intact.  Sensation intact.  Diminished ability to demonstrate full ROM due to pain and swelling.  No significant overlying skin changes.  Tenderness appreciated over middle and  distal phalanxes.  Nail plate undisturbed.  Skin:    General: Skin is dry.  Neurological:     Mental Status: Gabriella Snyder is alert.     GCS: GCS eye subscore is 4. GCS verbal subscore is 5. GCS motor subscore is 6.  Psychiatric:        Mood and Affect: Mood normal.        Behavior: Behavior normal.  Thought Content: Thought content normal.     ED Results / Procedures / Treatments   Labs (all labs ordered are listed, but only abnormal results are displayed) Labs Reviewed - No data to display  EKG None  Radiology DG Hand Complete Right  Result Date: 11/14/2019 CLINICAL DATA:  Pain following injury EXAM: RIGHT HAND - COMPLETE 3+ VIEW COMPARISON:  None. FINDINGS: Frontal, oblique and lateral views were obtained. There is an obliquely oriented fracture of the mid to distal aspect of the fifth middle phalanx with fracture fragments in near anatomic alignment. There is a smaller fracture fragment in the proximal most aspect of the fifth distal phalanx with alignment anatomic. No other fractures are evident. No dislocation. Joint spaces appear normal. No erosive change. IMPRESSION: Obliquely oriented fracture mid to distal aspect of fifth middle phalanx with near anatomic alignment. This fracture extends to the fifth DIP joint level. Additionally, there is a nondisplaced fracture of the proximal aspect of the fifth distal phalanx which extends to the fifth DIP joint. No other evident fractures. No dislocation. No appreciable arthropathic change. Electronically Signed   By: Lowella Grip III M.D.   On: 11/14/2019 13:45    Procedures Procedures (including critical care time)  Medications Ordered in ED Medications  ibuprofen (ADVIL) tablet 800 mg (800 mg Oral Given 11/14/19 1432)    ED Course  I have reviewed the triage vital signs and the nursing notes.  Pertinent labs & imaging results that were available during my care of the patient were reviewed by me and considered in my medical  decision making (see chart for details).    MDM Rules/Calculators/A&P                          I personally reviewed plain films obtained of right hand which demonstrate oblique fracture of fifth middle phalanx with extension towards DIP.  There is also an additional nondisplaced fracture involving proximal aspect of distal phalanx on same finger with extension towards DIP.  No dislocations or other appreciable arthropathic changes.  Do not feel as though immediate hand consult or emergent intervention is warranted.  Patient is neurovascular intact.  Will place in finger splint and buddy taped to fourth finger.  The remainder of her physical exam (see PE) was largely unremarkable.  Compartments are soft.  Will provide 800 mg ibuprofen here in the ED.  Gabriella Snyder is adamant that Gabriella Snyder is not pregnant.  Patient has an outpatient appointment scheduled with her primary care provider tomorrow.  We will also place referral for her to follow-up with hand surgery for ongoing evaluation and management.  All of the evaluation and work-up results were discussed with the patient and any family at bedside.  Patient and/or family were informed that while patient is appropriate for discharge at this time, some medical emergencies may only develop or become detectable after a period of time.  I specifically instructed patient and/or family to return to return to the ED or seek immediate medical attention for any new or worsening symptoms.  They were provided opportunity to ask any additional questions and have none at this time.  Prior to discharge patient is feeling well, agreeable with plan for discharge home.  They have expressed understanding of verbal discharge instructions as well as return precautions and are agreeable to the plan.    Final Clinical Impression(s) / ED Diagnoses Final diagnoses:  Closed nondisplaced fracture of middle phalanx of right ring finger, initial  encounter    Rx / DC Orders ED Discharge  Orders    None       Reita Chard 11/14/19 1517    Breck Coons, MD 11/15/19 [phone]

## 2019-11-14 NOTE — ED Notes (Signed)
Patient left before receiving the d/c instructions and d/c vitals.

## 2019-12-04 ENCOUNTER — Other Ambulatory Visit: Payer: Self-pay | Admitting: Orthopedic Surgery

## 2019-12-05 ENCOUNTER — Other Ambulatory Visit: Payer: Self-pay

## 2019-12-05 ENCOUNTER — Encounter (HOSPITAL_BASED_OUTPATIENT_CLINIC_OR_DEPARTMENT_OTHER): Payer: Self-pay | Admitting: Orthopedic Surgery

## 2019-12-09 ENCOUNTER — Other Ambulatory Visit (HOSPITAL_COMMUNITY): Payer: Medicaid Other | Attending: Orthopedic Surgery

## 2019-12-10 ENCOUNTER — Emergency Department (HOSPITAL_BASED_OUTPATIENT_CLINIC_OR_DEPARTMENT_OTHER)
Admission: EM | Admit: 2019-12-10 | Discharge: 2019-12-10 | Disposition: A | Discharge status: Home / Self Care | Payer: Medicaid Other | Attending: Emergency Medicine | Admitting: Emergency Medicine

## 2019-12-10 ENCOUNTER — Encounter (HOSPITAL_COMMUNITY): Payer: Self-pay | Admitting: *Deleted

## 2019-12-10 ENCOUNTER — Encounter (HOSPITAL_BASED_OUTPATIENT_CLINIC_OR_DEPARTMENT_OTHER): Payer: Self-pay | Admitting: Emergency Medicine

## 2019-12-10 ENCOUNTER — Other Ambulatory Visit: Payer: Self-pay

## 2019-12-10 ENCOUNTER — Emergency Department (HOSPITAL_COMMUNITY): Payer: Medicaid Other

## 2019-12-10 ENCOUNTER — Emergency Department (HOSPITAL_COMMUNITY)
Admission: EM | Admit: 2019-12-10 | Discharge: 2019-12-10 | Disposition: A | Discharge status: Home / Self Care | Payer: Medicaid Other | Source: Home / Self Care | Attending: Emergency Medicine | Admitting: Emergency Medicine

## 2019-12-10 DIAGNOSIS — M546 Pain in thoracic spine: Secondary | ICD-10-CM | POA: Insufficient documentation

## 2019-12-10 DIAGNOSIS — R0789 Other chest pain: Secondary | ICD-10-CM | POA: Insufficient documentation

## 2019-12-10 DIAGNOSIS — Z5321 Procedure and treatment not carried out due to patient leaving prior to being seen by health care provider: Secondary | ICD-10-CM | POA: Diagnosis not present

## 2019-12-10 DIAGNOSIS — F17210 Nicotine dependence, cigarettes, uncomplicated: Secondary | ICD-10-CM | POA: Insufficient documentation

## 2019-12-10 HISTORY — DX: Other chronic pain: G89.29

## 2019-12-10 LAB — CBC
HCT: 37.8 % (ref 36.0–46.0)
Hemoglobin: 11.5 g/dL — ABNORMAL LOW (ref 12.0–15.0)
MCH: 23.4 pg — ABNORMAL LOW (ref 26.0–34.0)
MCHC: 30.4 g/dL (ref 30.0–36.0)
MCV: 76.8 fL — ABNORMAL LOW (ref 80.0–100.0)
Platelets: 234 10*3/uL (ref 150–400)
RBC: 4.92 MIL/uL (ref 3.87–5.11)
RDW: 20 % — ABNORMAL HIGH (ref 11.5–15.5)
WBC: 8.2 10*3/uL (ref 4.0–10.5)
nRBC: 0 % (ref 0.0–0.2)

## 2019-12-10 LAB — BASIC METABOLIC PANEL
Anion gap: 6 (ref 5–15)
BUN: 11 mg/dL (ref 6–20)
CO2: 26 mmol/L (ref 22–32)
Calcium: 9 mg/dL (ref 8.9–10.3)
Chloride: 106 mmol/L (ref 98–111)
Creatinine, Ser: 1.02 mg/dL — ABNORMAL HIGH (ref 0.44–1.00)
GFR calc Af Amer: >60 mL/min (ref >60)
GFR calc non Af Amer: >60 mL/min (ref >60)
Glucose, Bld: 98 mg/dL (ref 70–99)
Potassium: 4.4 mmol/L (ref 3.5–5.1)
Sodium: 138 mmol/L (ref 135–145)

## 2019-12-10 LAB — I-STAT BETA HCG BLOOD, ED (MC, WL, AP ONLY): I-stat hCG, quantitative: <5 m[IU]/mL (ref <5)

## 2019-12-10 LAB — TROPONIN I (HIGH SENSITIVITY): Troponin I (High Sensitivity): <2 ng/L (ref <18)

## 2019-12-10 MED ORDER — SODIUM CHLORIDE 0.9% FLUSH: 3.0000 mL | Freq: Once | INTRAVENOUS | Status: DC

## 2019-12-10 NOTE — Discharge Instructions (Addendum)
Please continue taking your regular pain medications.  If your symptoms worsen you need to return to the emergency department for reevaluation.  Please follow-up with Dr. Fredna Dow regarding your symptoms.  It was a pleasure to meet you.

## 2019-12-10 NOTE — ED Triage Notes (Signed)
Left upper back pain and side, started this am. Unkown injury. Pain with movement. Pt states took her oxycontin at 5pm without relief. Ambulatory, NAD. Denies urinary sx.

## 2019-12-10 NOTE — ED Triage Notes (Signed)
Pt states she woke about 4 am yesterday morning with chest pain and back pain with some swelling reported to left posterior rib area. Husband placed Ice on it without relief. Senn at [facility] this morning about 0015 for same.

## 2019-12-10 NOTE — ED Notes (Signed)
At end of triage pt states they are going to leave because they dont want to wait to be seen.

## 2019-12-10 NOTE — ED Provider Notes (Signed)
[facility] DEPT Provider Note   CSN: [id number] Arrival date & time: 12/10/19  0915     History Chief Complaint  Patient presents with  . Back Pain  . Chest Pain    Gabriella Snyder is a 44 y.o. female.  HPI Patient is a 44 year old female with a medical history as noted below.  Yesterday she woke with atraumatic left thoracic back pain.  She feels as if her pain is worsening and now wraps around her left ribs to her left anterior chest.  Her pain worsens with palpation and movement.  She takes 30 mg of oxycodone daily for chronic pain management and has been taking this with minimal relief.  She additionally took a dose of Flexeril last night which help with difficulty sleeping but she states that it has done little in regards to her pain.  She denies any other complaints at this time.  No fevers, chills, shortness of breath, abdominal pain, nausea, vomiting, diarrhea, urinary changes, syncope.    Past Medical History:  Diagnosis Date  . Anemia   . Anxiety   . Chronic pain   . Degenerative disc disease, cervical   . Depression   . Heart murmur    "when I was younger; it closed up"  . History of blood transfusion 1993   "related to the C-section  . Migraine    "a couple times/yr" (02/25/2014)  . Muscle spasm   . Pyelonephritis 02/25/2014    Patient Active Problem List   Diagnosis Date Noted  . Trichomonal vaginitis 12/26/2017  . Morbid obesity ([place]) 02/17/2017  . DUB (dysfunctional uterine bleeding) 02/17/2017  . Yeast vaginitis 02/17/2017  . BV (bacterial vaginosis) 02/27/2014  . Iron deficiency anemia 02/27/2014  . Pyelonephritis 02/25/2014  . Acute pyelonephritis 02/25/2014    Past Surgical History:  Procedure Laterality Date  . CESAREAN SECTION  1993  . DILATION AND CURETTAGE OF UTERUS  2003  . TUBAL LIGATION  2011  . TUMOR EXCISION Right ~ 2004   "off my back"     OB History    Gravida  8   Para  5   Term  5    Preterm  0   AB  2   Living  5     SAB  1   TAB  0   Ectopic  0   Multiple  0   Live Births              Family History  Problem Relation Age of Onset  . Diabetes Mother   . Hypertension Father   . Cancer Maternal Grandfather   . Cancer Maternal Aunt        cervical  . Cancer Cousin        lymphoma  . Stroke Paternal Grandfather     Social History   Tobacco Use  . Smoking status: Current Every Day Smoker    Packs/day: 0.25    Years: 6.00    Pack years: 1.50    Types: Cigarettes  . Smokeless tobacco: Never Used  Vaping Use  . Vaping Use: Never used  Substance Use Topics  . Alcohol use: Yes    Comment: 02/25/2014 "don't really drink; might have a drink q now and then"  . Drug use: Yes    Types: Marijuana    Comment: 02/25/2014 "usually once/day; nothing in the last week"    Home Medications Prior to Admission medications   Medication Sig Start Date End Date  Taking? Authorizing Provider  ascorbic acid (VITAMIN C) 500 MG tablet Take 500 mg by mouth daily.    [provider]  cyclobenzaprine (FLEXERIL) 5 MG tablet Take 1 tablet (5 mg total) by mouth 3 (three) times daily as needed for muscle spasms. Patient taking differently: Take 5 mg by mouth 2 (two) times daily as needed for muscle spasms.  04/16/16   Domenic Moras, PA-C  ergocalciferol (VITAMIN D2) 1.25 MG (50000 UT) capsule Take 50,000 Units by mouth once a week.    [provider]  gabapentin (NEURONTIN) 300 MG capsule Take 1 capsule (300 mg total) by mouth 3 (three) times daily. 06/23/19   Virgel Manifold, MD  ibuprofen (ADVIL) 800 MG tablet Take 800 mg by mouth every 8 (eight) hours as needed for mild pain or moderate pain.    [provider]  Multiple Vitamin (MULTIVITAMIN) tablet Take 1 tablet by mouth daily.    [provider]  oxycodone-acetaminophen (LYNOX) 10-300 MG tablet Take 1 tablet by mouth every 4 (four) hours as needed for pain.    [provider]      Allergies    Patient has no known allergies.  Review of Systems   Review of Systems  All other systems reviewed and are negative. Ten systems reviewed and are negative for acute change, except as noted in the HPI.    Physical Exam Updated Vital Signs BP 117/78 (BP Location: Left Arm)   Pulse 69   Temp 98.2 F (36.8 C) (Oral)   Resp 15   Ht 5\' 8"  (1.727 m)   Wt 105.2 kg   LMP 11/21/2019 (Approximate)   SpO2 100%   BMI 35.27 kg/m   Physical Exam Vitals and nursing note reviewed.  Constitutional:      General: She is in acute distress.     Appearance: Normal appearance. She is well-developed. She is not ill-appearing, toxic-appearing or diaphoretic.     Comments: Well-developed female lying in the semi-Fowlers position.  She speaks clearly and coherently.  She appears to be in pain.  HENT:     Head: Normocephalic and atraumatic.     Right Ear: External ear normal.     Left Ear: External ear normal.     Nose: Nose normal.     Mouth/Throat:     Mouth: Mucous membranes are moist.     Pharynx: Oropharynx is clear. No oropharyngeal exudate or posterior oropharyngeal erythema.  Eyes:     Extraocular Movements: Extraocular movements intact.     Pupils: Pupils are equal, round, and reactive to light.  Cardiovascular:     Rate and Rhythm: Normal rate and regular rhythm.     Pulses: Normal pulses.          Radial pulses are 2+ on the right side and 2+ on the left side.       Dorsalis pedis pulses are 2+ on the right side and 2+ on the left side.     Heart sounds: Normal heart sounds. No murmur heard.  No diastolic murmur is present.  No friction rub. No gallop.   Pulmonary:     Effort: Pulmonary effort is normal. No tachypnea or respiratory distress.     Breath sounds: Normal breath sounds. No stridor. No decreased breath sounds, wheezing, rhonchi or rales.  Chest:     Chest wall: Tenderness present. No deformity or crepitus.     Comments: Moderate TTP noted diffusely  along the left thoracic paraspinal musculature, left axilla, left  chest.  No crepitus or deformity noted.  No edema or overlying erythema.  No zoster-like rash visualized. Abdominal:     General: Abdomen is flat.     Palpations: Abdomen is soft.     Tenderness: There is no abdominal tenderness.     Comments: Abdomen is soft and nontender in all 4 quadrants.  Musculoskeletal:        General: Normal range of motion.     Cervical back: Normal range of motion and neck supple. No tenderness.     Right lower leg: No tenderness. No edema.     Left lower leg: No tenderness. No edema.  Skin:    General: Skin is warm and dry.  Neurological:     General: No focal deficit present.     Mental Status: She is alert and oriented to person, place, and time.  Psychiatric:        Mood and Affect: Mood normal.        Behavior: Behavior normal.    ED Results / Procedures / Treatments   Labs (all labs ordered are listed, but only abnormal results are displayed) Labs Reviewed  BASIC METABOLIC PANEL - Abnormal; Notable for the following components:      Result Value   Creatinine, Ser 1.02 (*)    All other components within normal limits  CBC - Abnormal; Notable for the following components:   Hemoglobin 11.5 (*)    MCV 76.8 (*)    MCH 23.4 (*)    RDW 20.0 (*)    All other components within normal limits  I-STAT BETA HCG BLOOD, ED ([facility], [facility], [facility] ONLY)  TROPONIN I (HIGH SENSITIVITY)   EKG EKG Interpretation  Date/Time:  Tuesday December 10 2019 12:41:23 EDT Ventricular Rate:  66 PR Interval:    QRS Duration: 95 QT Interval:  409 QTC Calculation: 429 R Axis:   63 Text Interpretation: Sinus rhythm no acute ST/T changes no significant change since 2016 Confirmed by Sherwood Gambler [phone]) on 12/10/2019 1:10:48 PM  Radiology DG Chest 2 View  Result Date: 12/10/2019 CLINICAL DATA:  Chest pain. EXAM: CHEST - 2 VIEW COMPARISON:  02/25/2014 FINDINGS: The cardiomediastinal silhouette is unchanged with  normal heart size. The lungs are mildly hypoinflated. No airspace consolidation, edema, pleural effusion, pneumothorax is identified. No acute osseous abnormality is seen. IMPRESSION: No active cardiopulmonary disease. Electronically Signed   By: Sukaina Toothaker Bores M.D.   On: 12/10/2019 10:15   Procedures Procedures   Medications Ordered in ED Medications - No data to display  ED Course  I have reviewed the triage vital signs and the nursing notes.  Pertinent labs & imaging results that were available during my care of the patient were reviewed by me and considered in my medical decision making (see chart for details).    MDM Rules/Calculators/A&P                          Pt is a 44 y.o. female that present with a history, physical exam, ED Clinical Course as noted above.   Patient presents today with atraumatic left thoracic pain that spread along her left axilla and left chest.  Patient has palpable pain in the prior mentioned regions.  No visible rash or signs of erythema.  Exam nonconcerning for herpes zoster at this time.  Her labs are generally reassuring.  Hemoglobin of 11.5, creatinine of 1.02.  No elevation of troponin.  Patient is not pregnant.  Vital  signs are stable.  She is not tachycardic.  She is not hypoxic.  She is afebrile.  Patient was seen with similar symptoms in February of this year.  She had an MRI performed of the thoracic region.  I do not think further imaging is warranted at this time.  She states she is followed by Dr. Fredna Dow and I recommended that she reach out to him to schedule a follow-up appointment.  She is currently taking cyclobenzaprine as well as oxycodone at home.  Recommended that she continue with her current pain regimen.  She understands she can return to the emergency department with new or worsening symptoms.  Her questions were answered and she was amicable at the time of discharge.  Her vital signs are stable.  Patient discharged to home/self  care.  Condition at discharge: Stable  Note: Portions of this report may have been transcribed using voice recognition software. Every effort was made to ensure accuracy; however, inadvertent computerized transcription errors may be present.   Final Clinical Impression(s) / ED Diagnoses Final diagnoses:  Acute left-sided thoracic back pain  Chest wall pain   Rx / DC Orders ED Discharge Orders    None       Rayna Sexton, PA-C 12/10/19 1329    Sherwood Gambler, MD 12/12/19 [phone]

## 2019-12-11 ENCOUNTER — Other Ambulatory Visit (HOSPITAL_COMMUNITY)
Admission: RE | Admit: 2019-12-11 | Discharge: 2019-12-11 | Disposition: A | Discharge status: Home / Self Care | Payer: Medicaid Other | Source: Ambulatory Visit | Attending: Orthopedic Surgery | Admitting: Orthopedic Surgery

## 2019-12-11 DIAGNOSIS — Z01812 Encounter for preprocedural laboratory examination: Secondary | ICD-10-CM | POA: Diagnosis present

## 2019-12-11 DIAGNOSIS — Z20822 Contact with and (suspected) exposure to covid-19: Secondary | ICD-10-CM | POA: Diagnosis not present

## 2019-12-11 LAB — SARS CORONAVIRUS 2 (TAT 6-24 HRS): SARS Coronavirus 2: NEGATIVE

## 2019-12-11 NOTE — Progress Notes (Signed)

## 2019-12-12 ENCOUNTER — Encounter (HOSPITAL_BASED_OUTPATIENT_CLINIC_OR_DEPARTMENT_OTHER): Payer: Self-pay | Admitting: Orthopedic Surgery

## 2019-12-12 ENCOUNTER — Ambulatory Visit (HOSPITAL_BASED_OUTPATIENT_CLINIC_OR_DEPARTMENT_OTHER): Payer: Medicaid Other | Admitting: Anesthesiology

## 2019-12-12 ENCOUNTER — Ambulatory Visit (HOSPITAL_BASED_OUTPATIENT_CLINIC_OR_DEPARTMENT_OTHER)
Admission: RE | Admit: 2019-12-12 | Discharge: 2019-12-12 | Disposition: A | Discharge status: Home / Self Care | Payer: Medicaid Other | Attending: Orthopedic Surgery | Admitting: Orthopedic Surgery

## 2019-12-12 ENCOUNTER — Encounter (HOSPITAL_BASED_OUTPATIENT_CLINIC_OR_DEPARTMENT_OTHER)
Admission: RE | Disposition: A | Discharge status: Home / Self Care | Payer: Self-pay | Source: Home / Self Care | Attending: Orthopedic Surgery

## 2019-12-12 ENCOUNTER — Other Ambulatory Visit: Payer: Self-pay

## 2019-12-12 DIAGNOSIS — X58XXXA Exposure to other specified factors, initial encounter: Secondary | ICD-10-CM | POA: Insufficient documentation

## 2019-12-12 DIAGNOSIS — Z79899 Other long term (current) drug therapy: Secondary | ICD-10-CM | POA: Insufficient documentation

## 2019-12-12 DIAGNOSIS — S62626A Displaced fracture of medial phalanx of right little finger, initial encounter for closed fracture: Secondary | ICD-10-CM | POA: Diagnosis present

## 2019-12-12 DIAGNOSIS — M1990 Unspecified osteoarthritis, unspecified site: Secondary | ICD-10-CM | POA: Insufficient documentation

## 2019-12-12 DIAGNOSIS — F17210 Nicotine dependence, cigarettes, uncomplicated: Secondary | ICD-10-CM | POA: Diagnosis not present

## 2019-12-12 DIAGNOSIS — S62636A Displaced fracture of distal phalanx of right little finger, initial encounter for closed fracture: Secondary | ICD-10-CM | POA: Diagnosis not present

## 2019-12-12 HISTORY — PX: OPEN REDUCTION INTERNAL FIXATION (ORIF) METACARPAL: SHX6234

## 2019-12-12 LAB — POCT PREGNANCY, URINE: Preg Test, Ur: NEGATIVE

## 2019-12-12 SURGERY — OPEN REDUCTION INTERNAL FIXATION (ORIF) METACARPAL
Anesthesia: Monitor Anesthesia Care | Site: Finger | Laterality: Right

## 2019-12-12 MED ORDER — OXYCODONE HCL 5 MG PO TABS
ORAL_TABLET | ORAL | Status: AC
  Filled 2019-12-12: qty 1

## 2019-12-12 MED ORDER — FENTANYL CITRATE (PF) 100 MCG/2ML IJ SOLN
INTRAMUSCULAR | Status: AC
  Filled 2019-12-12: qty 2

## 2019-12-12 MED ORDER — ONDANSETRON HCL 4 MG/2ML IJ SOLN
INTRAMUSCULAR | Status: DC | PRN
  Administered 2019-12-12: 4 mg via INTRAVENOUS

## 2019-12-12 MED ORDER — EPHEDRINE SULFATE 50 MG/ML IJ SOLN
INTRAMUSCULAR | Status: DC | PRN
  Administered 2019-12-12: 10 mg via INTRAVENOUS

## 2019-12-12 MED ORDER — PROPOFOL 10 MG/ML IV BOLUS
INTRAVENOUS | Status: DC | PRN
  Administered 2019-12-12: 200 mg via INTRAVENOUS

## 2019-12-12 MED ORDER — SUCCINYLCHOLINE CHLORIDE 200 MG/10ML IV SOSY
PREFILLED_SYRINGE | INTRAVENOUS | Status: AC
  Filled 2019-12-12: qty 10

## 2019-12-12 MED ORDER — PROPOFOL 500 MG/50ML IV EMUL
INTRAVENOUS | Status: AC
  Filled 2019-12-12: qty 50

## 2019-12-12 MED ORDER — MIDAZOLAM HCL 2 MG/2ML IJ SOLN
INTRAMUSCULAR | Status: AC
  Filled 2019-12-12: qty 2

## 2019-12-12 MED ORDER — HYDROMORPHONE HCL 1 MG/ML IJ SOLN
0.5000 mg | INTRAMUSCULAR | Status: DC | PRN
  Administered 2019-12-12: 0.25 mg via INTRAVENOUS

## 2019-12-12 MED ORDER — DEXAMETHASONE SODIUM PHOSPHATE 10 MG/ML IJ SOLN
INTRAMUSCULAR | Status: DC | PRN
  Administered 2019-12-12: 10 mg via INTRAVENOUS

## 2019-12-12 MED ORDER — ACETAMINOPHEN 500 MG PO TABS
1000.0000 mg | ORAL_TABLET | Freq: Once | ORAL | Status: AC
  Administered 2019-12-12: 1000 mg via ORAL

## 2019-12-12 MED ORDER — LIDOCAINE HCL (CARDIAC) PF 100 MG/5ML IV SOSY
PREFILLED_SYRINGE | INTRAVENOUS | Status: DC | PRN
  Administered 2019-12-12: 100 mg via INTRAVENOUS

## 2019-12-12 MED ORDER — LIDOCAINE 2% (20 MG/ML) 5 ML SYRINGE
INTRAMUSCULAR | Status: AC
  Filled 2019-12-12: qty 5

## 2019-12-12 MED ORDER — DEXAMETHASONE SODIUM PHOSPHATE 10 MG/ML IJ SOLN
INTRAMUSCULAR | Status: AC
  Filled 2019-12-12: qty 1

## 2019-12-12 MED ORDER — FENTANYL CITRATE (PF) 100 MCG/2ML IJ SOLN
INTRAMUSCULAR | Status: DC | PRN
  Administered 2019-12-12: 100 ug via INTRAVENOUS

## 2019-12-12 MED ORDER — LACTATED RINGERS IV SOLN: INTRAVENOUS | Status: DC

## 2019-12-12 MED ORDER — ACETAMINOPHEN 500 MG PO TABS
ORAL_TABLET | ORAL | Status: AC
  Filled 2019-12-12: qty 2

## 2019-12-12 MED ORDER — DIPHENHYDRAMINE HCL 50 MG/ML IJ SOLN
INTRAMUSCULAR | Status: AC
  Filled 2019-12-12: qty 1

## 2019-12-12 MED ORDER — EPHEDRINE 5 MG/ML INJ
INTRAVENOUS | Status: AC
  Filled 2019-12-12: qty 10

## 2019-12-12 MED ORDER — CEFAZOLIN SODIUM-DEXTROSE 2-4 GM/100ML-% IV SOLN
2.0000 g | INTRAVENOUS | Status: AC
  Administered 2019-12-12: 2 g via INTRAVENOUS

## 2019-12-12 MED ORDER — MIDAZOLAM HCL 5 MG/5ML IJ SOLN
INTRAMUSCULAR | Status: DC | PRN
  Administered 2019-12-12: 2 mg via INTRAVENOUS

## 2019-12-12 MED ORDER — HYDROMORPHONE HCL 1 MG/ML IJ SOLN
INTRAMUSCULAR | Status: AC
  Filled 2019-12-12: qty 0.5

## 2019-12-12 MED ORDER — BUPIVACAINE HCL (PF) 0.25 % IJ SOLN
INTRAMUSCULAR | Status: DC | PRN
  Administered 2019-12-12: 7 mL

## 2019-12-12 MED ORDER — ONDANSETRON HCL 4 MG/2ML IJ SOLN
INTRAMUSCULAR | Status: AC
  Filled 2019-12-12: qty 2

## 2019-12-12 MED ORDER — CEFAZOLIN SODIUM-DEXTROSE 2-4 GM/100ML-% IV SOLN
INTRAVENOUS | Status: AC
  Filled 2019-12-12: qty 100

## 2019-12-12 MED ORDER — FENTANYL CITRATE (PF) 100 MCG/2ML IJ SOLN
25.0000 ug | INTRAMUSCULAR | Status: DC | PRN
  Administered 2019-12-12 (×2): 50 ug via INTRAVENOUS

## 2019-12-12 MED ORDER — OXYCODONE HCL 5 MG PO TABS
10.0000 mg | ORAL_TABLET | Freq: Once | ORAL | Status: AC
  Administered 2019-12-12: 10 mg via ORAL

## 2019-12-12 MED ORDER — PHENYLEPHRINE 40 MCG/ML (10ML) SYRINGE FOR IV PUSH (FOR BLOOD PRESSURE SUPPORT)
PREFILLED_SYRINGE | INTRAVENOUS | Status: AC
  Filled 2019-12-12: qty 10

## 2019-12-12 MED ORDER — DIPHENHYDRAMINE HCL 50 MG/ML IJ SOLN
INTRAMUSCULAR | Status: DC | PRN
  Administered 2019-12-12: 6.25 mg via INTRAVENOUS

## 2019-12-12 SURGICAL SUPPLY — 56 items
APL PRP STRL LF DISP 70% ISPRP (MISCELLANEOUS) ×1
BIT DRILL .75 (BIT) ×2 IMPLANT
BLADE SURG 15 STRL LF DISP TIS (BLADE) ×2 IMPLANT
BLADE SURG 15 STRL SS (BLADE) ×6
BNDG CMPR 9X4 STRL LF SNTH (GAUZE/BANDAGES/DRESSINGS) ×1
BNDG ELASTIC 2X5.8 VLCR STR LF (GAUZE/BANDAGES/DRESSINGS) IMPLANT
BNDG ELASTIC 3X5.8 VLCR STR LF (GAUZE/BANDAGES/DRESSINGS) ×3 IMPLANT
BNDG ESMARK 4X9 LF (GAUZE/BANDAGES/DRESSINGS) ×3 IMPLANT
BNDG GAUZE ELAST 4 BULKY (GAUZE/BANDAGES/DRESSINGS) ×3 IMPLANT
CHLORAPREP W/TINT 26 (MISCELLANEOUS) ×3 IMPLANT
CORD BIPOLAR FORCEPS 12FT (ELECTRODE) ×3 IMPLANT
COVER BACK TABLE 60X90IN (DRAPES) ×3 IMPLANT
COVER MAYO STAND STRL (DRAPES) ×3 IMPLANT
COVER WAND RF STERILE (DRAPES) IMPLANT
CUFF TOURN SGL QUICK 18X4 (TOURNIQUET CUFF) ×3 IMPLANT
DRAPE EXTREMITY T 121X128X90 (DISPOSABLE) ×3 IMPLANT
DRAPE OEC MINIVIEW 54X84 (DRAPES) ×3 IMPLANT
DRAPE SURG 17X23 STRL (DRAPES) ×3 IMPLANT
GAUZE SPONGE 4X4 12PLY STRL (GAUZE/BANDAGES/DRESSINGS) ×3 IMPLANT
GAUZE XEROFORM 1X8 LF (GAUZE/BANDAGES/DRESSINGS) ×3 IMPLANT
GLOVE BIO SURGEON STRL SZ7.5 (GLOVE) ×3 IMPLANT
GLOVE BIOGEL PI IND STRL 8 (GLOVE) ×1 IMPLANT
GLOVE BIOGEL PI IND STRL 8.5 (GLOVE) IMPLANT
GLOVE BIOGEL PI INDICATOR 8 (GLOVE) ×2
GLOVE BIOGEL PI INDICATOR 8.5 (GLOVE)
GLOVE SURG ORTHO 8.0 STRL STRW (GLOVE) IMPLANT
GOWN STRL REUS W/ TWL LRG LVL3 (GOWN DISPOSABLE) ×1 IMPLANT
GOWN STRL REUS W/TWL LRG LVL3 (GOWN DISPOSABLE) ×3
GOWN STRL REUS W/TWL XL LVL3 (GOWN DISPOSABLE) ×3 IMPLANT
NDL HYPO 25X1 1.5 SAFETY (NEEDLE) IMPLANT
NEEDLE HYPO 25X1 1.5 SAFETY (NEEDLE) IMPLANT
NS IRRIG 1000ML POUR BTL (IV SOLUTION) ×3 IMPLANT
PACK BASIN DAY SURGERY FS (CUSTOM PROCEDURE TRAY) ×3 IMPLANT
PAD CAST 3X4 CTTN HI CHSV (CAST SUPPLIES) IMPLANT
PAD CAST 4YDX4 CTTN HI CHSV (CAST SUPPLIES) ×1 IMPLANT
PADDING CAST COTTON 3X4 STRL (CAST SUPPLIES)
PADDING CAST COTTON 4X4 STRL (CAST SUPPLIES) ×3
SCREW CORT CREO 1.0X6 (Screw) ×2 IMPLANT
SCREW CORT PA CREO 1X8 (Screw) ×2 IMPLANT
SLEEVE SCD COMPRESS KNEE MED (MISCELLANEOUS) IMPLANT
SPLINT PLASTER CAST XFAST 3X15 (CAST SUPPLIES) IMPLANT
SPLINT PLASTER CAST XFAST 4X15 (CAST SUPPLIES) IMPLANT
SPLINT PLASTER XTRA FAST SET 4 (CAST SUPPLIES)
SPLINT PLASTER XTRA FASTSET 3X (CAST SUPPLIES)
STOCKINETTE 4X48 STRL (DRAPES) ×3 IMPLANT
SUT CHROMIC 4 0 PS 2 18 (SUTURE) ×3 IMPLANT
SUT ETHILON 3 0 PS 1 (SUTURE) IMPLANT
SUT ETHILON 4 0 PS 2 18 (SUTURE) ×3 IMPLANT
SUT MERSILENE 4 0 P 3 (SUTURE) IMPLANT
SUT VIC AB 3-0 PS1 18 (SUTURE)
SUT VIC AB 3-0 PS1 18XBRD (SUTURE) IMPLANT
SUT VICRYL 4-0 PS2 18IN ABS (SUTURE) ×3 IMPLANT
SYR BULB EAR ULCER 3OZ GRN STR (SYRINGE) ×3 IMPLANT
SYR CONTROL 10ML LL (SYRINGE) IMPLANT
TOWEL GREEN STERILE FF (TOWEL DISPOSABLE) ×6 IMPLANT
UNDERPAD 30X36 HEAVY ABSORB (UNDERPADS AND DIAPERS) ×3 IMPLANT

## 2019-12-12 NOTE — Discharge Instructions (Addendum)
[facility] Instructions Hand Surgery  Wound Care: Keep your hand elevated above the level of your heart.  Do not allow it to dangle by your side.  Keep the dressing dry and do not remove it unless your doctor advises you to do so.  He will usually change it at the time of your post-op visit.  Moving your fingers is advised to stimulate circulation but will depend on the site of your surgery.  If you have a splint applied, your doctor will advise you regarding movement.  Activity: Do not drive or operate machinery today.  Rest today and then you may return to your normal activity and work as indicated by your physician.  Diet:  Drink liquids today or eat a light diet.  You may resume a regular diet tomorrow.    General expectations: Pain for two to three days. Fingers may become slightly swollen.  Call your doctor if any of the following occur: Severe pain not relieved by pain medication. Elevated temperature. Dressing soaked with blood. Inability to move fingers. White or bluish color to fingers.   No Tylenol until 6:08 pm   Post Anesthesia Home Care Instructions  Activity: Get plenty of rest for the remainder of the day. A responsible individual must stay with you for 24 hours following the procedure.  For the next 24 hours, DO NOT: -Drive a car -Paediatric nurse -Drink alcoholic beverages -Take any medication unless instructed by your physician -Make any legal decisions or sign important papers.  Meals: Start with liquid foods such as gelatin or soup. Progress to regular foods as tolerated. Avoid greasy, spicy, heavy foods. If nausea and/or vomiting occur, drink only clear liquids until the nausea and/or vomiting subsides. Call your physician if vomiting continues.  Special Instructions/Symptoms: Your throat may feel dry or sore from the anesthesia or the breathing tube placed in your throat during surgery. If this causes discomfort, gargle with warm salt water. The  discomfort should disappear within 24 hours.  If you had a scopolamine patch placed behind your ear for the management of post- operative nausea and/or vomiting:  1. The medication in the patch is effective for 72 hours, after which it should be removed.  Wrap patch in a tissue and discard in the trash. Wash hands thoroughly with soap and water. 2. You may remove the patch earlier than 72 hours if you experience unpleasant side effects which may include dry mouth, dizziness or visual disturbances. 3. Avoid touching the patch. Wash your hands with soap and water after contact with the patch.

## 2019-12-12 NOTE — Anesthesia Procedure Notes (Signed)
Procedure Name: LMA Insertion Date/Time: 12/12/2019 1:36 PM Performed by: Willa Frater, CRNA Pre-anesthesia Checklist: Patient identified, Emergency Drugs available, Suction available and Patient being monitored Patient Re-evaluated:Patient Re-evaluated prior to induction Oxygen Delivery Method: Circle system utilized Preoxygenation: Pre-oxygenation with 100% oxygen Induction Type: IV induction Ventilation: Mask ventilation without difficulty LMA: LMA inserted LMA Size: 4.0 Number of attempts: 1 Airway Equipment and Method: Bite block Placement Confirmation: positive ETCO2 Tube secured with: Tape Dental Injury: Teeth and Oropharynx as per pre-operative assessment

## 2019-12-12 NOTE — Op Note (Addendum)
NAME: Gabriella Snyder MEDICAL RECORD NO: [id number] DATE OF BIRTH: August 11, 1975 FACILITY: [facility] LOCATION: [facility] PHYSICIAN: Tennis Must, MD   OPERATIVE REPORT   DATE OF PROCEDURE: 12/12/19    PREOPERATIVE DIAGNOSIS:   Right small finger middle and distal phalangeal fractures   POSTOPERATIVE DIAGNOSIS:   Right small finger middle and distal phalangeal fractures   PROCEDURE:   Open reduction internal fixation right small finger middle phalanx fracture, closed treatment right small finger distal phalanx fracture   SURGEON:  Leanora Cover, M.D.   ASSISTANT: Daryll Brod, MD   ANESTHESIA:  General   INTRAVENOUS FLUIDS:  Per anesthesia flow sheet.   ESTIMATED BLOOD LOSS:  Minimal.   COMPLICATIONS:  None.   SPECIMENS:  none   TOURNIQUET TIME:    Total Tourniquet Time Documented: Upper Arm (Right) - 55 minutes Total: Upper Arm (Right) - 55 minutes    DISPOSITION:  Stable to PACU.   INDICATIONS: 44 year old female sustained fracture of right small finger distal aspect of middle phalanx and base of distal phalanx.  Attempts at nonoperative treatment but displacement has occurred in the middle phalanx.  She wishes to proceed with operative fixation.  Risks, benefits and alternatives of surgery were discussed including the risks of blood loss, infection, damage to nerves, vessels, tendons, ligaments, bone for surgery, need for additional surgery, complications with wound healing, continued pain, nonunion, malunion,  stiffness.  She voiced understanding of these risks and elected to proceed.  OPERATIVE COURSE:  After being identified preoperatively by myself,  the patient and I agreed on the procedure and site of the procedure.  The surgical site was marked.  Surgical consent had been signed. She was given IV antibiotics as preoperative antibiotic prophylaxis. She was transferred to the operating room and placed on the operating table in supine position with the  Right upper extremity on an arm board.  General anesthesia was induced by the anesthesiologist.  Right upper extremity was prepped and draped in normal sterile orthopedic fashion.  A surgical pause was performed between the surgeons, anesthesia, and operating room staff and all were in agreement as to the patient, procedure, and site of procedure.  Tourniquet at the proximal aspect of the extremity was inflated to 250 mmHg after exsanguination of the arm with an Esmarch bandage.    Attempted closed reduction of the middle phalanx fracture were unsuccessful.  C-arm was used during this procedure.  A hockey-stick shaped incision was made over the middle phalanx and carried into subcutaneous tissues by spreading technique.  The periosteum was incised and the extensor tendon retracted.  The fracture site was identified.  Was cleared of soft tissue and early callus formation.  It was reduced under direct visualization.  Good reduction was obtained.  1.0 millimeter screws from the Arthrex set were used.  Standard AO drilling measuring technique was used.  2 screws were able to be placed in a transverse fashion across the fracture site.  Good purchase was obtained.  C-arm was used in AP lateral and oblique projections to ensure appropriate reduction position of heart which was the case.  The wrist was placed through tenodesis and there was no scissoring of the small finger.  The wound was copiously irrigated with sterile saline.  It was closed with 4-0 nylon in a horizontal mattress fashion.  It was then dressed with sterile Xeroform 4 x 4 and wrapped with a Coban dressing lightly.  An AlumaFoam splint was placed and wrapped lightly with  Coban dressing.  Digital block was performed with quarter percent plain Marcaine to aid in postoperative analgesia.  The tourniquet was deflated at 55 minutes.  Fingertips were pink with brisk capillary refill after deflation of tourniquet.  The operative  drapes were broken down.  The  patient was awoken from anesthesia safely.  She was transferred back to the stretcher and taken to PACU in stable condition.  I will see her back in the office in 1 week for postoperative followup.    She currently has prescription for Lonox 10/301 p.o. every 4 hours as needed pain.   Leanora Cover, MD Electronically signed, 12/12/19

## 2019-12-12 NOTE — Transfer of Care (Signed)
Immediate Anesthesia Transfer of Care Note  Patient: Gabriella Snyder  Procedure(s) Performed: CLOSED REDUCTION SMALL  FINGER MIDDLE AND DISTAL PHALANX WITH PERCUTANEOUS PINNING VS. OPEN REDUCTION INTERNAL FIXATION (Right Finger) OPEN REDUCTION INTERNAL FIXATION (ORIF) MIDDLE AND DISTAL PHALANX FRACTURE (Right Finger)  Patient Location: PACU  Anesthesia Type:General  Level of Consciousness: awake, alert , oriented and drowsy  Airway & Oxygen Therapy: Patient Spontanous Breathing and Patient connected to face mask oxygen  Post-op Assessment: Report given to RN and Post -op Vital signs reviewed and stable  Post vital signs: Reviewed and stable  Last Vitals:  Vitals Value Taken Time  BP 110/85 12/12/19 1452  Temp    Pulse 94 12/12/19 1452  Resp 18 12/12/19 1452  SpO2 100 % 12/12/19 1452  Vitals shown include unvalidated device data.  Last Pain:  Vitals:   12/12/19 1201  TempSrc: Oral  PainSc: 0-No pain         Complications: No complications documented.

## 2019-12-12 NOTE — Anesthesia Preprocedure Evaluation (Addendum)
Anesthesia Evaluation  Patient identified by MRN, date of birth, ID band Patient awake    Reviewed: Allergy & Precautions, NPO status , Patient's Chart, lab work & pertinent test results  Airway Mallampati: I       Dental no notable dental hx. (+) Teeth Intact   Pulmonary neg pulmonary ROS, Current Smoker and Patient abstained from smoking.,    Pulmonary exam normal breath sounds clear to auscultation       Cardiovascular negative cardio ROS Normal cardiovascular exam Rhythm:Regular Rate:Normal     Neuro/Psych  Headaches, PSYCHIATRIC DISORDERS Anxiety Depression    GI/Hepatic negative GI ROS, (+)     substance abuse  marijuana use,   Endo/Other  negative endocrine ROS  Renal/GU negative Renal ROS  negative genitourinary   Musculoskeletal  (+) Arthritis ,   Abdominal (+) + obese,   Peds  Hematology negative hematology ROS (+)   Anesthesia Other Findings   Reproductive/Obstetrics                            Anesthesia Physical Anesthesia Plan  ASA: II  Anesthesia Plan: MAC   Post-op Pain Management:    Induction: Intravenous  PONV Risk Score and Plan: 1 and Propofol infusion and Treatment may vary due to age or medical condition  Airway Management Planned: Natural Airway  Additional Equipment:   Intra-op Plan:   Post-operative Plan:   Informed Consent: I have reviewed the patients History and Physical, chart, labs and discussed the procedure including the risks, benefits and alternatives for the proposed anesthesia with the patient or authorized representative who has indicated his/her understanding and acceptance.     Dental advisory given  Plan Discussed with: CRNA  Anesthesia Plan Comments:         Anesthesia Quick Evaluation

## 2019-12-12 NOTE — Anesthesia Postprocedure Evaluation (Signed)
Anesthesia Post Note  Patient: Gabriella Snyder  Procedure(s) Performed: OPEN REDUCTION INTERNAL FIXATION (ORIF) MIDDLE AND DISTAL PHALANX FRACTURE (Right Finger)     Patient location during evaluation: PACU Anesthesia Type: MAC Level of consciousness: awake Pain management: pain level controlled Vital Signs Assessment: post-procedure vital signs reviewed and stable Respiratory status: spontaneous breathing Cardiovascular status: stable Postop Assessment: no apparent nausea or vomiting Anesthetic complications: no   No complications documented.  Last Vitals:  Vitals:   12/12/19 1530 12/12/19 1613  BP: 117/66 (!) 112/56  Pulse: 60 66  Resp: 14 16  Temp:  36.4 C  SpO2: 100% 100%    Last Pain:  Vitals:   12/12/19 1613  TempSrc:   PainSc: [place] Jr

## 2019-12-12 NOTE — Op Note (Signed)
I assisted Surgeon(s) and Role:    * Leanora Cover, MD - Primary    Daryll Brod, MD on the Procedure(s): CLOSED REDUCTION SMALL  FINGER MIDDLE AND DISTAL PHALANX WITH PERCUTANEOUS PINNING VS. OPEN REDUCTION INTERNAL FIXATION OPEN REDUCTION INTERNAL FIXATION (ORIF) MIDDLE AND DISTAL PHALANX FRACTURE on 12/12/2019.  I provided assistance on this case as follows: Set up, approach, isolation of the fracture, debridement of the fracture, reduction and stabilization of the fracture and application measuring and insertion of 1 mm screws, closure of the wound and application of dressing and splint. Electronically signed by: Daryll Brod, MD Date: 12/12/2019 Time: 2:42 PM

## 2019-12-12 NOTE — H&P (Signed)
Gabriella Snyder is an 44 y.o. female.   Chief Complaint: right small finger fracture HPI: 44 yo female with fracture right small finger middle and distal phalanges.  Displacement after attempts at non operative treatment.  She wishes to proceed with operative fixation.  Allergies: No Known Allergies  Past Medical History:  Diagnosis Date  . Anemia   . Anxiety   . Chronic pain   . Degenerative disc disease, cervical   . Depression   . Heart murmur    "when I was younger; it closed up"  . History of blood transfusion 1993   "related to the C-section  . Migraine    "a couple times/yr" (02/25/2014)  . Muscle spasm   . Pyelonephritis 02/25/2014    Past Surgical History:  Procedure Laterality Date  . CESAREAN SECTION  1993  . DILATION AND CURETTAGE OF UTERUS  2003  . TUBAL LIGATION  2011  . TUMOR EXCISION Right ~ 2004   "off my back"    Family History: Family History  Problem Relation Age of Onset  . Diabetes Mother   . Hypertension Father   . Cancer Maternal Grandfather   . Cancer Maternal Aunt        cervical  . Cancer Cousin        lymphoma  . Stroke Paternal Grandfather     Social History:   reports that she has been smoking cigarettes. She has a 1.50 pack-year smoking history. She has never used smokeless tobacco. She reports current alcohol use. She reports current drug use. Drug: Marijuana.  Medications: Medications Prior to Admission  Medication Sig Dispense Refill  . ascorbic acid (VITAMIN C) 500 MG tablet Take 500 mg by mouth daily.    . ergocalciferol (VITAMIN D2) 1.25 MG (50000 UT) capsule Take 50,000 Units by mouth once a week.    . gabapentin (NEURONTIN) 300 MG capsule Take 1 capsule (300 mg total) by mouth 3 (three) times daily. 21 capsule 0  . Multiple Vitamin (MULTIVITAMIN) tablet Take 1 tablet by mouth daily.    Marland Kitchen oxycodone-acetaminophen (LYNOX) 10-300 MG tablet Take 1 tablet by mouth every 4 (four) hours as needed for pain.    . cyclobenzaprine  (FLEXERIL) 5 MG tablet Take 1 tablet (5 mg total) by mouth 3 (three) times daily as needed for muscle spasms. (Patient taking differently: Take 5 mg by mouth 2 (two) times daily as needed for muscle spasms. ) 20 tablet 0  . ibuprofen (ADVIL) 800 MG tablet Take 800 mg by mouth every 8 (eight) hours as needed for mild pain or moderate pain.      Results for orders placed or performed during the hospital encounter of 12/12/19 (from the past 48 hour(s))  Pregnancy, urine POC     Status: None   Collection Time: 12/12/19 12:21 PM  Result Value Ref Range   Preg Test, Ur NEGATIVE NEGATIVE    Comment:        THE SENSITIVITY OF THIS METHODOLOGY IS >24 mIU/mL     No results found.   A comprehensive review of systems was negative.  Blood pressure 117/65, pulse 79, temperature 97.8 F (36.6 C), temperature source Oral, resp. rate 19, height 5\' 10"  (1.778 m), weight 112.6 kg, last menstrual period 11/21/2019, SpO2 100 %.  General appearance: alert, cooperative and appears stated age Head: Normocephalic, without obvious abnormality, atraumatic Neck: supple, symmetrical, trachea midline Cardio: regular rate and rhythm Resp: clear to auscultation bilaterally Extremities: Intact sensation and capillary refill all  digits.  +epl/fpl/io.  No wounds.  Pulses: 2+ and symmetric Skin: Skin color, texture, turgor normal. No rashes or lesions Neurologic: Grossly normal Incision/Wound: none  Assessment/Plan Right small finger middle and distal phalangeal fractures.  Plan operative reduction and fixation.  Non operative and operative treatment options have been discussed with the patient and patient wishes to proceed with operative treatment. Risks, benefits, and alternatives of surgery have been discussed and the patient agrees with the plan of care.   Leanora Cover 12/12/2019, 1:17 PM

## 2019-12-13 ENCOUNTER — Encounter (HOSPITAL_BASED_OUTPATIENT_CLINIC_OR_DEPARTMENT_OTHER): Payer: Self-pay | Admitting: Orthopedic Surgery

## 2020-01-16 ENCOUNTER — Ambulatory Visit: Payer: Medicaid Other | Admitting: Family Medicine

## 2020-06-17 ENCOUNTER — Other Ambulatory Visit: Payer: Self-pay

## 2020-06-17 ENCOUNTER — Emergency Department (HOSPITAL_COMMUNITY): Payer: Medicaid Other

## 2020-06-17 ENCOUNTER — Emergency Department (HOSPITAL_COMMUNITY)
Admission: EM | Admit: 2020-06-17 | Discharge: 2020-06-17 | Disposition: A | Discharge status: Home / Self Care | Payer: Medicaid Other | Attending: Emergency Medicine | Admitting: Emergency Medicine

## 2020-06-17 ENCOUNTER — Encounter (HOSPITAL_COMMUNITY): Payer: Self-pay

## 2020-06-17 DIAGNOSIS — R079 Chest pain, unspecified: Secondary | ICD-10-CM | POA: Insufficient documentation

## 2020-06-17 DIAGNOSIS — R109 Unspecified abdominal pain: Secondary | ICD-10-CM | POA: Insufficient documentation

## 2020-06-17 DIAGNOSIS — Z8616 Personal history of COVID-19: Secondary | ICD-10-CM | POA: Diagnosis not present

## 2020-06-17 DIAGNOSIS — Z5321 Procedure and treatment not carried out due to patient leaving prior to being seen by health care provider: Secondary | ICD-10-CM | POA: Diagnosis not present

## 2020-06-17 DIAGNOSIS — R112 Nausea with vomiting, unspecified: Secondary | ICD-10-CM | POA: Insufficient documentation

## 2020-06-17 LAB — BASIC METABOLIC PANEL
Anion gap: 11 (ref 5–15)
BUN: 9 mg/dL (ref 6–20)
CO2: 21 mmol/L — ABNORMAL LOW (ref 22–32)
Calcium: 9.4 mg/dL (ref 8.9–10.3)
Chloride: 105 mmol/L (ref 98–111)
Creatinine, Ser: 0.99 mg/dL (ref 0.44–1.00)
GFR, Estimated: >60 mL/min (ref >60)
Glucose, Bld: 113 mg/dL — ABNORMAL HIGH (ref 70–99)
Potassium: 3.9 mmol/L (ref 3.5–5.1)
Sodium: 137 mmol/L (ref 135–145)

## 2020-06-17 LAB — CBC
HCT: 37.8 % (ref 36.0–46.0)
Hemoglobin: 11.5 g/dL — ABNORMAL LOW (ref 12.0–15.0)
MCH: 22.6 pg — ABNORMAL LOW (ref 26.0–34.0)
MCHC: 30.4 g/dL (ref 30.0–36.0)
MCV: 74.4 fL — ABNORMAL LOW (ref 80.0–100.0)
Platelets: 256 10*3/uL (ref 150–400)
RBC: 5.08 MIL/uL (ref 3.87–5.11)
RDW: 22.8 % — ABNORMAL HIGH (ref 11.5–15.5)
WBC: 11.8 10*3/uL — ABNORMAL HIGH (ref 4.0–10.5)
nRBC: 0 % (ref 0.0–0.2)

## 2020-06-17 LAB — I-STAT BETA HCG BLOOD, ED (NOT ORDERABLE): I-stat hCG, quantitative: <5 m[IU]/mL (ref <5)

## 2020-06-17 LAB — TROPONIN I (HIGH SENSITIVITY): Troponin I (High Sensitivity): <2 ng/L (ref <18)

## 2020-06-17 NOTE — ED Triage Notes (Signed)
Patient arrived stating she has had central chest pain over the last few days, reports today the pain worsened and began having abdominal pain and NV. States she is getting over Covid-19

## 2020-06-30 ENCOUNTER — Emergency Department (HOSPITAL_COMMUNITY): Payer: Medicaid Other

## 2020-06-30 ENCOUNTER — Other Ambulatory Visit: Payer: Self-pay

## 2020-06-30 ENCOUNTER — Emergency Department (HOSPITAL_COMMUNITY)
Admission: EM | Admit: 2020-06-30 | Discharge: 2020-07-01 | Disposition: A | Discharge status: Home / Self Care | Payer: Medicaid Other | Attending: Emergency Medicine | Admitting: Emergency Medicine

## 2020-06-30 ENCOUNTER — Emergency Department (HOSPITAL_COMMUNITY)
Admission: EM | Admit: 2020-06-30 | Discharge: 2020-06-30 | Discharge status: Absent without leave | Payer: Medicaid Other

## 2020-06-30 ENCOUNTER — Encounter (HOSPITAL_COMMUNITY): Payer: Self-pay | Admitting: Emergency Medicine

## 2020-06-30 DIAGNOSIS — F17210 Nicotine dependence, cigarettes, uncomplicated: Secondary | ICD-10-CM | POA: Insufficient documentation

## 2020-06-30 DIAGNOSIS — R5383 Other fatigue: Secondary | ICD-10-CM | POA: Insufficient documentation

## 2020-06-30 DIAGNOSIS — N3000 Acute cystitis without hematuria: Secondary | ICD-10-CM

## 2020-06-30 DIAGNOSIS — R531 Weakness: Secondary | ICD-10-CM | POA: Insufficient documentation

## 2020-06-30 DIAGNOSIS — R110 Nausea: Secondary | ICD-10-CM | POA: Diagnosis not present

## 2020-06-30 DIAGNOSIS — M791 Myalgia, unspecified site: Secondary | ICD-10-CM | POA: Insufficient documentation

## 2020-06-30 DIAGNOSIS — R109 Unspecified abdominal pain: Secondary | ICD-10-CM | POA: Insufficient documentation

## 2020-06-30 LAB — CBC
HCT: 37.1 % (ref 36.0–46.0)
Hemoglobin: 11.7 g/dL — ABNORMAL LOW (ref 12.0–15.0)
MCH: 23.1 pg — ABNORMAL LOW (ref 26.0–34.0)
MCHC: 31.5 g/dL (ref 30.0–36.0)
MCV: 73.2 fL — ABNORMAL LOW (ref 80.0–100.0)
Platelets: 319 K/uL (ref 150–400)
RBC: 5.07 MIL/uL (ref 3.87–5.11)
RDW: 23.6 % — ABNORMAL HIGH (ref 11.5–15.5)
WBC: 8.5 K/uL (ref 4.0–10.5)
nRBC: 0 % (ref 0.0–0.2)

## 2020-06-30 LAB — COMPREHENSIVE METABOLIC PANEL
ALT: 11 U/L (ref 0–44)
AST: 15 U/L (ref 15–41)
Albumin: 4 g/dL (ref 3.5–5.0)
Alkaline Phosphatase: 57 U/L (ref 38–126)
Anion gap: 12 (ref 5–15)
BUN: 10 mg/dL (ref 6–20)
CO2: 19 mmol/L — ABNORMAL LOW (ref 22–32)
Calcium: 9.6 mg/dL (ref 8.9–10.3)
Chloride: 106 mmol/L (ref 98–111)
Creatinine, Ser: 0.89 mg/dL (ref 0.44–1.00)
GFR, Estimated: >60 mL/min (ref >60)
Glucose, Bld: 107 mg/dL — ABNORMAL HIGH (ref 70–99)
Potassium: 3.7 mmol/L (ref 3.5–5.1)
Sodium: 137 mmol/L (ref 135–145)
Total Bilirubin: 0.6 mg/dL (ref 0.3–1.2)
Total Protein: 7.9 g/dL (ref 6.5–8.1)

## 2020-06-30 LAB — URINALYSIS, ROUTINE W REFLEX MICROSCOPIC
Glucose, UA: NEGATIVE mg/dL
Ketones, ur: 20 mg/dL — AB
Nitrite: NEGATIVE
Protein, ur: 30 mg/dL — AB
Specific Gravity, Urine: 1.033 — ABNORMAL HIGH (ref 1.005–1.030)
Squamous Epithelial / HPF: >50 — ABNORMAL HIGH (ref 0–5)
pH: 5 (ref 5.0–8.0)

## 2020-06-30 LAB — I-STAT BETA HCG BLOOD, ED (MC, WL, AP ONLY): I-stat hCG, quantitative: <5 m[IU]/mL (ref <5)

## 2020-06-30 LAB — LIPASE, BLOOD: Lipase: 30 U/L (ref 11–51)

## 2020-06-30 MED ORDER — CEPHALEXIN 500 MG PO CAPS: 500.0000 mg | ORAL_CAPSULE | Freq: Two times a day (BID) | ORAL | 0 refills | Status: AC

## 2020-06-30 MED ORDER — ONDANSETRON HCL 4 MG/2ML IJ SOLN
4.0000 mg | Freq: Once | INTRAMUSCULAR | Status: AC
  Administered 2020-06-30: 4 mg via INTRAVENOUS
  Filled 2020-06-30: qty 2

## 2020-06-30 MED ORDER — FENTANYL CITRATE (PF) 100 MCG/2ML IJ SOLN
50.0000 ug | Freq: Once | INTRAMUSCULAR | Status: AC
  Administered 2020-06-30: 50 ug via INTRAVENOUS
  Filled 2020-06-30: qty 2

## 2020-06-30 MED ORDER — SODIUM CHLORIDE 0.9 % IV SOLN
1.0000 g | Freq: Once | INTRAVENOUS | Status: AC
  Administered 2020-06-30: 1 g via INTRAVENOUS
  Filled 2020-06-30: qty 10

## 2020-06-30 NOTE — ED Notes (Signed)
I explained to pt that is she waits in the car and we call her name and we do not see here within 5 minutes that she will be taken off the floor. And the room will be given to someone else.  Pt understood and went to wait in the car.

## 2020-06-30 NOTE — Discharge Instructions (Addendum)
Contact a health care provider if: Your symptoms do not get better after 1-2 days. Your symptoms go away and then return. Get help right away if: You have severe pain in your back or your lower abdomen. You have a fever or chills. You have nausea or vomiting. 

## 2020-06-30 NOTE — ED Provider Notes (Signed)
[facility] EMERGENCY DEPARTMENT Provider Note   CSN: [id number] Arrival date & time: 06/30/20  1429     History Chief Complaint  Patient presents with  . Abdominal Pain    Gabriella Snyder is a 45 y.o. female who presents emergency department with chief complaint of flank pain. She has a past medical history of recurrent urinary tract infection and pyelonephritis. She was diagnosed with COVID-19 in late January. She states that she just seem to be getting worse and worse after that, she saw her primary care doctor who diagnosed her with a urinary tract infection, gave her "2 shots" which the patient cannot tell me what they were, and then had outpatient CT imaging. Apparently her doctor called her today and told her that she had a mass on her liver. She is unable to tell me anything further and I am unable to see the notes or results from her previous work-up. She continues to have severe suprapubic pain radiating to her bilateral flanks. She has associated body aches, weakness, fatigue, severe pain and some nausea. She states this feels the same as her previous episodes of pyelonephritis.  HPI     Past Medical History:  Diagnosis Date  . Anemia   . Anxiety   . Chronic pain   . Degenerative disc disease, cervical   . Depression   . Heart murmur    "when I was younger; it closed up"  . History of blood transfusion 1993   "related to the C-section  . Migraine    "a couple times/yr" (02/25/2014)  . Muscle spasm   . Pyelonephritis 02/25/2014    Patient Active Problem List   Diagnosis Date Noted  . Trichomonal vaginitis 12/26/2017  . Morbid obesity ([place]) 02/17/2017  . DUB (dysfunctional uterine bleeding) 02/17/2017  . Yeast vaginitis 02/17/2017  . BV (bacterial vaginosis) 02/27/2014  . Iron deficiency anemia 02/27/2014  . Pyelonephritis 02/25/2014  . Acute pyelonephritis 02/25/2014    Past Surgical History:  Procedure Laterality Date  . CESAREAN  SECTION  1993  . DILATION AND CURETTAGE OF UTERUS  2003  . OPEN REDUCTION INTERNAL FIXATION (ORIF) METACARPAL Right 12/12/2019   Procedure: OPEN REDUCTION INTERNAL FIXATION (ORIF) MIDDLE AND DISTAL PHALANX FRACTURE;  Surgeon: Leanora Cover, MD;  Location: [facility];  Service: Orthopedics;  Laterality: Right;  . TUBAL LIGATION  2011  . TUMOR EXCISION Right ~ 2004   "off my back"     OB History    Gravida  8   Para  5   Term  5   Preterm  0   AB  2   Living  5     SAB  1   IAB  0   Ectopic  0   Multiple  0   Live Births              Family History  Problem Relation Age of Onset  . Diabetes Mother   . Hypertension Father   . Cancer Maternal Grandfather   . Cancer Maternal Aunt        cervical  . Cancer Cousin        lymphoma  . Stroke Paternal Grandfather     Social History   Tobacco Use  . Smoking status: Current Every Day Smoker    Packs/day: 0.25    Years: 6.00    Pack years: 1.50    Types: Cigarettes  . Smokeless tobacco: Never Used  Vaping Use  .  Vaping Use: Never used  Substance Use Topics  . Alcohol use: Yes    Comment: 02/25/2014 "don't really drink; might have a drink q now and then"  . Drug use: Yes    Types: Marijuana    Comment: 3 nights ago     Home Medications Prior to Admission medications   Medication Sig Start Date End Date Taking? Authorizing Provider  ascorbic acid (VITAMIN C) 500 MG tablet Take 500 mg by mouth daily.    [provider]  cyclobenzaprine (FLEXERIL) 5 MG tablet Take 1 tablet (5 mg total) by mouth 3 (three) times daily as needed for muscle spasms. Patient taking differently: Take 5 mg by mouth 2 (two) times daily as needed for muscle spasms.  04/16/16   Domenic Moras, PA-C  ergocalciferol (VITAMIN D2) 1.25 MG (50000 UT) capsule Take 50,000 Units by mouth once a week.    [provider]  gabapentin (NEURONTIN) 300 MG capsule Take 1 capsule (300 mg total) by mouth 3 (three) times daily.  06/23/19   Virgel Manifold, MD  ibuprofen (ADVIL) 800 MG tablet Take 800 mg by mouth every 8 (eight) hours as needed for mild pain or moderate pain.    [provider]  Multiple Vitamin (MULTIVITAMIN) tablet Take 1 tablet by mouth daily.    [provider]  oxycodone-acetaminophen (LYNOX) 10-300 MG tablet Take 1 tablet by mouth every 4 (four) hours as needed for pain.    [provider]    Allergies    Patient has no known allergies.  Review of Systems   Review of Systems Ten systems reviewed and are negative for acute change, except as noted in the HPI.   Physical Exam Updated Vital Signs BP 125/79 (BP Location: Left Wrist)   Pulse 71   Temp 98.6 F (37 C)   Resp 16   LMP 06/26/2020   SpO2 100%   Physical Exam Vitals and nursing note reviewed.  Constitutional:      General: She is not in acute distress.    Appearance: She is well-developed and well-nourished. She is ill-appearing. She is not diaphoretic.  HENT:     Head: Normocephalic and atraumatic.  Eyes:     General: No scleral icterus.    Conjunctiva/sclera: Conjunctivae normal.     Pupils: Pupils are equal, round, and reactive to light.  Cardiovascular:     Rate and Rhythm: Normal rate and regular rhythm.     Heart sounds: Normal heart sounds. No murmur heard. No friction rub. No gallop.   Pulmonary:     Effort: Pulmonary effort is normal. No respiratory distress.     Breath sounds: Normal breath sounds.  Abdominal:     General: Bowel sounds are normal. There is no distension.     Palpations: Abdomen is soft. There is no mass.     Tenderness: There is no abdominal tenderness. There is left CVA tenderness. There is no guarding.  Musculoskeletal:     Cervical back: Normal range of motion.  Skin:    General: Skin is warm and dry.  Neurological:     Mental Status: She is alert and oriented to person, place, and time.  Psychiatric:        Behavior: Behavior normal.     ED Results /  Procedures / Treatments   Labs (all labs ordered are listed, but only abnormal results are displayed) Labs Reviewed  COMPREHENSIVE METABOLIC PANEL - Abnormal; Notable for the following components:  Result Value   CO2 19 (*)    Glucose, Bld 107 (*)    All other components within normal limits  CBC - Abnormal; Notable for the following components:   Hemoglobin 11.7 (*)    MCV 73.2 (*)    MCH 23.1 (*)    RDW 23.6 (*)    All other components within normal limits  URINALYSIS, ROUTINE W REFLEX MICROSCOPIC - Abnormal; Notable for the following components:   Color, Urine AMBER (*)    APPearance CLOUDY (*)    Specific Gravity, Urine 1.033 (*)    Hgb urine dipstick LARGE (*)    Bilirubin Urine SMALL (*)    Ketones, ur 20 (*)    Protein, ur 30 (*)    Leukocytes,Ua SMALL (*)    Bacteria, UA MANY (*)    Squamous Epithelial / LPF >50 (*)    All other components within normal limits  URINE CULTURE  LIPASE, BLOOD  I-STAT BETA HCG BLOOD, ED ([facility], [facility], [facility] ONLY)    EKG None  Radiology No results found.  Procedures Procedures   Medications Ordered in ED Medications  cefTRIAXone (ROCEPHIN) 1 g in sodium chloride 0.9 % 100 mL IVPB (1 g Intravenous New Bag/Given 06/30/20 2022)  fentaNYL (SUBLIMAZE) injection 50 mcg (has no administration in time range)  ondansetron (ZOFRAN) injection 4 mg (has no administration in time range)    ED Course  I have reviewed the triage vital signs and the nursing notes.  Pertinent labs & imaging results that were available during my care of the patient were reviewed by me and considered in my medical decision making (see chart for details).    MDM Rules/Calculators/A&P                         45 year old female here with flank pain and flulike symptoms. The differential diagnosis of emergent flank pain includes, but is not limited to :Abdominal aortic aneurysm,, Renal artery embolism,Renal vein thrombosis, Aortic dissection, Mesenteric ischemia,  Pyelonephritis, Renal infarction, Renal hemorrhage, Nephrolithiasis/ Renal Colic, Bladder tumor,Cystitis, Biliary colic, Pancreatitis Perforated peptic ulcer Appendicitis ,Inguinal Hernia, Diverticulitis, Bowel obstruction.PID/TOA,Ovarian cyst, Ovarian torsion Shingles Lower lobe pneumonia, Retroperitoneal hematoma/abscess/tumor, Epidural abscess, Epidural hematoma I ordered and reviewed labs which include CBC which shows no acute abnormalities, CMP with slightly elevated blood glucose, urine appears infected and sent for culture.  Negative pregnancy test.  Lipase within normal limits.  I ordered and reviewed a CT renal stone study which shows no acute abnormalities or evidence of pyelonephritis and a stable small lesion in the liver which has been on the study since 2015.  I spoke with Dr. Quintella Reichert about these findings and it has not changed in the past 7 years.  I discussed the findings of her work-up as well as a CT scan and have reassured the patient that this appears stable and does not likely reflect cancerous lesion or something seriously dangerous is most likely a hemangioma of the liver.  Patient feels comfortable with this and will be discharged on antibiotics.  She is feeling improved.  She appears otherwise appropriate for discharge at this time.   Final Clinical Impression(s) / ED Diagnoses Final diagnoses:  None    Rx / DC Orders ED Discharge Orders    None       Margarita Mail, PA-C 06/30/20 2359    Truddie Hidden, MD 07/01/20 [phone]

## 2020-06-30 NOTE — ED Triage Notes (Signed)
Pt reports L upper back pain for a few years.  Reports lower abd pain that radiates to back x 2 weeks.  States her Dr called and told her she has a mass on her liver.

## 2020-07-01 NOTE — ED Notes (Signed)
Pt verbalized understanding of d/c instructions, follow up and medications. Pt ambulatory to [facility] with steady gait. NAD.

## 2020-07-02 LAB — URINE CULTURE: Special Requests: NORMAL

## 2020-11-23 ENCOUNTER — Ambulatory Visit: Payer: Medicaid Other | Admitting: Obstetrics & Gynecology

## 2020-11-23 ENCOUNTER — Other Ambulatory Visit: Payer: Self-pay

## 2020-11-23 ENCOUNTER — Encounter: Payer: Self-pay | Admitting: Obstetrics & Gynecology

## 2020-11-23 VITALS — BP 116/63 | HR 73 | Ht 71.0 in | Wt 249.0 lb

## 2020-11-23 DIAGNOSIS — D509 Iron deficiency anemia, unspecified: Secondary | ICD-10-CM | POA: Diagnosis not present

## 2020-11-23 DIAGNOSIS — Z01419 Encounter for gynecological examination (general) (routine) without abnormal findings: Secondary | ICD-10-CM | POA: Diagnosis not present

## 2020-11-23 DIAGNOSIS — N939 Abnormal uterine and vaginal bleeding, unspecified: Secondary | ICD-10-CM

## 2020-11-23 MED ORDER — NORETHIN ACE-ETH ESTRAD-FE 1-20 MG-MCG(24) PO TABS: 1.0000 | ORAL_TABLET | Freq: Every day | ORAL | 11 refills | Status: DC

## 2020-11-23 NOTE — Progress Notes (Signed)
GYNECOLOGY ANNUAL PREVENTATIVE CARE ENCOUNTER NOTE  History:     Gabriella Snyder is a 45 y.o. [id number] female here for a routine annual gynecologic exam.  Current complaints: more frequent heavy menstrual periods in the last few months, cycle length is about 21 or less days. History of intramural uterine fibroid, has been 4 cm and stable in size for years (last seen on 06/2020 CT scan).   Reports having diagnosis of iron deficiency anemia, desires iron infusions but her PCP is unable to order these at [facility].  Denies current abnormal vaginal bleeding, discharge, pelvic pain, problems with intercourse or other gynecologic concerns.    Gynecologic History No LMP recorded. Contraception: tubal ligation Last Pap: 06/03/2019. Results were: normal with negative HPV Last mammogram: 06/03/2019. Results were: normal  Obstetric History OB History  Gravida Para Term Preterm AB Living  8 5 5  0 2 5  SAB IAB Ectopic Multiple Live Births  1 0 0 0      # Outcome Date GA Lbr Len/2nd Weight Sex Delivery Anes PTL Lv  8 SAB           7 AB           6 Term      Vag-Spont     5 Term      Vag-Spont     4 Term      Vag-Spont     3 Term      Vag-Spont     2 Term      CS-Unspec     1 Gravida             Past Medical History:  Diagnosis Date   Anemia    Anxiety    Chronic pain    Degenerative disc disease, cervical    Depression    Heart murmur    "when I was younger; it closed up"   History of blood transfusion 1993   "related to the C-section   Migraine    "a couple times/yr" (02/25/2014)   Muscle spasm    Pyelonephritis 02/25/2014    Past Surgical History:  Procedure Laterality Date   [place] OF UTERUS  2003   OPEN REDUCTION INTERNAL FIXATION (ORIF) METACARPAL Right 12/12/2019   Procedure: OPEN REDUCTION INTERNAL FIXATION (ORIF) MIDDLE AND DISTAL PHALANX FRACTURE;  Surgeon: Leanora Cover, MD;  Location: [facility];  Service:  Orthopedics;  Laterality: Right;   TUBAL LIGATION  2011   TUMOR EXCISION Right ~ 2004   "off my back"    Current Outpatient Medications on File Prior to Visit  Medication Sig Dispense Refill   gabapentin (NEURONTIN) 300 MG capsule Take 1 capsule (300 mg total) by mouth 3 (three) times daily. 21 capsule 0   ibuprofen (ADVIL) 800 MG tablet Take 800 mg by mouth every 8 (eight) hours as needed for mild pain or moderate pain.     Multiple Vitamin (MULTIVITAMIN) tablet Take 1 tablet by mouth daily.     oxycodone-acetaminophen (LYNOX) 10-300 MG tablet Take 1 tablet by mouth every 4 (four) hours as needed for pain.     ascorbic acid (VITAMIN C) 500 MG tablet Take 500 mg by mouth daily.     cyclobenzaprine (FLEXERIL) 5 MG tablet Take 1 tablet (5 mg total) by mouth 3 (three) times daily as needed for muscle spasms. (Patient taking differently: Take 5 mg by mouth 2 (two) times daily as needed for muscle  spasms. ) 20 tablet 0   ergocalciferol (VITAMIN D2) 1.25 MG (50000 UT) capsule Take 50,000 Units by mouth once a week.     No current facility-administered medications on file prior to visit.    No Known Allergies  Social History:  reports that she has been smoking cigarettes. She has a 1.50 pack-year smoking history. She has never used smokeless tobacco. She reports current alcohol use. She reports current drug use. Drug: Marijuana.  Family History  Problem Relation Age of Onset   Diabetes Mother    Hypertension Father    Cancer Maternal Grandfather    Cancer Maternal Aunt        cervical   Cancer Cousin        lymphoma   Stroke Paternal Grandfather     The following portions of the patient's history were reviewed and updated as appropriate: allergies, current medications, past family history, past medical history, past social history, past surgical history and problem list.  Review of Systems Pertinent items noted in HPI and remainder of comprehensive ROS otherwise negative.  Physical  Exam:  BP 116/63   Pulse 73   Ht 5\' 11"  (1.803 m)   Wt 249 lb (112.9 kg)   BMI 34.73 kg/m  CONSTITUTIONAL: Well-developed, well-nourished female in no acute distress.  HENT:  Normocephalic, atraumatic, External right and left ear normal.  EYES: Conjunctivae and EOM are normal. Pupils are equal, round, and reactive to light. No scleral icterus.  NECK: Normal range of motion, supple, no masses.  Normal thyroid.  SKIN: Skin is warm and dry. No rash noted. Not diaphoretic. No erythema. No pallor. MUSCULOSKELETAL: Normal range of motion. No tenderness.  No cyanosis, clubbing, or edema. NEUROLOGIC: Alert and oriented to person, place, and time. Normal reflexes, muscle tone coordination.  PSYCHIATRIC: Normal mood and affect. Normal behavior. Normal judgment and thought content. CARDIOVASCULAR: Normal heart rate noted, regular rhythm RESPIRATORY: Clear to auscultation bilaterally. Effort and breath sounds normal, no problems with respiration noted. BREASTS: Symmetric in size. No masses, tenderness, skin changes, nipple drainage, or lymphadenopathy bilaterally. Performed in the presence of a chaperone. ABDOMEN: Soft, no distention noted.  No tenderness, rebound or guarding.  PELVIC: Deferred   Assessment and Plan:    1. Abnormal uterine bleeding (AUB) Will check labs. Recommended trial of low dose OCPs to help with AUB regulation, she agreed to this. R/B/I/A discussed. Will re-evaluate response in two months.  - CBC - TSH - Norethindrone Acetate-Ethinyl Estrad-FE (LOESTRIN 24 FE) 1-20 MG-MCG(24) tablet; Take 1 tablet by mouth daily.  Dispense: 28 tablet; Refill: 11  2. Iron deficiency anemia, unspecified iron deficiency anemia type Will check ferritin and CBC and manage accordingly. - Ferritin  3. Well woman exam with routine gynecological exam Up to date on pap and mammogram. Routine preventative health maintenance measures emphasized. Please refer to After Visit Summary for other  counseling recommendations.      Verita Schneiders, MD, [place] for [employer], [place]

## 2020-11-24 LAB — CBC
Hematocrit: 34.2 % (ref 34.0–46.6)
Hemoglobin: 10.5 g/dL — ABNORMAL LOW (ref 11.1–15.9)
MCH: 22.8 pg — ABNORMAL LOW (ref 26.6–33.0)
MCHC: 30.7 g/dL — ABNORMAL LOW (ref 31.5–35.7)
MCV: 74 fL — ABNORMAL LOW (ref 79–97)
Platelets: 301 10*3/uL (ref 150–450)
RBC: 4.61 x10E6/uL (ref 3.77–5.28)
RDW: 21.4 % — ABNORMAL HIGH (ref 11.7–15.4)
WBC: 10.3 10*3/uL (ref 3.4–10.8)

## 2020-11-24 LAB — TSH: TSH: 2.1 u[IU]/mL (ref 0.450–4.500)

## 2020-11-24 LAB — FERRITIN: Ferritin: 19 ng/mL (ref 15–150)

## 2020-11-24 MED ORDER — FERROUS SULFATE 325 (65 FE) MG PO TABS: 325.0000 mg | ORAL_TABLET | ORAL | 3 refills | Status: DC

## 2020-11-24 NOTE — Addendum Note (Signed)
Addended by: Verita Schneiders A on: 11/24/2020 06:36 AM   Modules accepted: Orders

## 2021-02-26 ENCOUNTER — Other Ambulatory Visit (HOSPITAL_BASED_OUTPATIENT_CLINIC_OR_DEPARTMENT_OTHER): Payer: Self-pay | Admitting: Family Medicine

## 2021-02-26 ENCOUNTER — Telehealth (HOSPITAL_BASED_OUTPATIENT_CLINIC_OR_DEPARTMENT_OTHER): Payer: Self-pay

## 2021-02-26 DIAGNOSIS — Z1231 Encounter for screening mammogram for malignant neoplasm of breast: Secondary | ICD-10-CM

## 2021-03-04 ENCOUNTER — Telehealth (HOSPITAL_BASED_OUTPATIENT_CLINIC_OR_DEPARTMENT_OTHER): Payer: Self-pay

## 2021-03-07 ENCOUNTER — Emergency Department (HOSPITAL_COMMUNITY)
Admission: EM | Admit: 2021-03-07 | Discharge: 2021-03-07 | Discharge status: Against Medical Advice | Payer: Medicaid Other

## 2021-03-07 ENCOUNTER — Other Ambulatory Visit: Payer: Self-pay

## 2021-08-25 ENCOUNTER — Emergency Department: Payer: BC Managed Care – PPO

## 2021-08-25 ENCOUNTER — Other Ambulatory Visit: Payer: Self-pay

## 2021-08-25 ENCOUNTER — Emergency Department
Admission: EM | Admit: 2021-08-25 | Discharge: 2021-08-25 | Disposition: A | Discharge status: Home / Self Care | Payer: BC Managed Care – PPO | Attending: Emergency Medicine | Admitting: Emergency Medicine

## 2021-08-25 DIAGNOSIS — Z20822 Contact with and (suspected) exposure to covid-19: Secondary | ICD-10-CM | POA: Insufficient documentation

## 2021-08-25 DIAGNOSIS — D72829 Elevated white blood cell count, unspecified: Secondary | ICD-10-CM | POA: Insufficient documentation

## 2021-08-25 DIAGNOSIS — F419 Anxiety disorder, unspecified: Secondary | ICD-10-CM | POA: Diagnosis not present

## 2021-08-25 DIAGNOSIS — R531 Weakness: Secondary | ICD-10-CM

## 2021-08-25 DIAGNOSIS — I639 Cerebral infarction, unspecified: Secondary | ICD-10-CM

## 2021-08-25 DIAGNOSIS — R251 Tremor, unspecified: Secondary | ICD-10-CM | POA: Diagnosis not present

## 2021-08-25 DIAGNOSIS — R200 Anesthesia of skin: Secondary | ICD-10-CM | POA: Insufficient documentation

## 2021-08-25 DIAGNOSIS — D649 Anemia, unspecified: Secondary | ICD-10-CM | POA: Diagnosis not present

## 2021-08-25 DIAGNOSIS — G43909 Migraine, unspecified, not intractable, without status migrainosus: Secondary | ICD-10-CM | POA: Insufficient documentation

## 2021-08-25 LAB — DIFFERENTIAL
Abs Immature Granulocytes: 0.01 10*3/uL (ref 0.00–0.07)
Basophils Absolute: 0 10*3/uL (ref 0.0–0.1)
Basophils Relative: 1 %
Eosinophils Absolute: 0.2 10*3/uL (ref 0.0–0.5)
Eosinophils Relative: 2 %
Immature Granulocytes: 0 %
Lymphocytes Relative: 43 %
Lymphs Abs: 3.4 10*3/uL (ref 0.7–4.0)
Monocytes Absolute: 0.4 10*3/uL (ref 0.1–1.0)
Monocytes Relative: 5 %
Neutro Abs: 4 10*3/uL (ref 1.7–7.7)
Neutrophils Relative %: 49 %
Smear Review: NORMAL

## 2021-08-25 LAB — CBC
HCT: 29.6 % — ABNORMAL LOW (ref 36.0–46.0)
Hemoglobin: 8.8 g/dL — ABNORMAL LOW (ref 12.0–15.0)
MCH: 21.2 pg — ABNORMAL LOW (ref 26.0–34.0)
MCHC: 29.7 g/dL — ABNORMAL LOW (ref 30.0–36.0)
MCV: 71.3 fL — ABNORMAL LOW (ref 80.0–100.0)
Platelets: 219 10*3/uL (ref 150–400)
RBC: 4.15 MIL/uL (ref 3.87–5.11)
RDW: 22 % — ABNORMAL HIGH (ref 11.5–15.5)
WBC: 8 10*3/uL (ref 4.0–10.5)
nRBC: 0 % (ref 0.0–0.2)

## 2021-08-25 LAB — COMPREHENSIVE METABOLIC PANEL
ALT: 9 U/L (ref 0–44)
AST: 21 U/L (ref 15–41)
Albumin: 3.9 g/dL (ref 3.5–5.0)
Alkaline Phosphatase: 53 U/L (ref 38–126)
Anion gap: 9 (ref 5–15)
BUN: 10 mg/dL (ref 6–20)
CO2: 23 mmol/L (ref 22–32)
Calcium: 9 mg/dL (ref 8.9–10.3)
Chloride: 104 mmol/L (ref 98–111)
Creatinine, Ser: 1.01 mg/dL — ABNORMAL HIGH (ref 0.44–1.00)
GFR, Estimated: >60 mL/min (ref >60)
Glucose, Bld: 99 mg/dL (ref 70–99)
Potassium: 3.9 mmol/L (ref 3.5–5.1)
Sodium: 136 mmol/L (ref 135–145)
Total Bilirubin: 0.7 mg/dL (ref 0.3–1.2)
Total Protein: 7.5 g/dL (ref 6.5–8.1)

## 2021-08-25 LAB — ETHANOL: Alcohol, Ethyl (B): <10 mg/dL (ref <10)

## 2021-08-25 LAB — RESP PANEL BY RT-PCR (FLU A&B, COVID) ARPGX2
Influenza A by PCR: NEGATIVE
Influenza B by PCR: NEGATIVE
SARS Coronavirus 2 by RT PCR: NEGATIVE

## 2021-08-25 LAB — APTT: aPTT: 29 seconds (ref 24–36)

## 2021-08-25 LAB — PROTIME-INR
INR: 1 (ref 0.8–1.2)
Prothrombin Time: 13.1 seconds (ref 11.4–15.2)

## 2021-08-25 MED ORDER — GADOBUTROL 1 MMOL/ML IV SOLN
10.0000 mL | Freq: Once | INTRAVENOUS | Status: AC | PRN
  Administered 2021-08-25: 10 mL via INTRAVENOUS

## 2021-08-25 NOTE — ED Provider Notes (Signed)
? ?[facility] ?Provider Note ? ? ? Event Date/Time  ? First MD Initiated Contact with Patient 08/25/21 1315   ?  (approximate) ? ? ?History  ? ?Weakness ? ? ?HPI ? ?Gabriella Snyder is a 46 y.o. female with past medical history of anemia, anxiety, chronic pain, depression, not on blood thinners and no prior stroke history who presents EMS for evaluation of cute onset of numbness in the bilateral hands and feet as well as some weakness in the bilateral hands more on the right than the left that reportedly started around 10:20 AM today.  He states that she is intermittently noted some weakness left hand with past week and has been dropping things.  No falls or injuries.  No headache, earache, sore throat, fevers, cough, nausea, vomiting, diarrhea, rash or any other sick symptoms.  No recent illicit drug use tobacco abuse.  No prior similar episodes.  No clear leaving aggravating factors.  Patient made code stroke prior to my assessment. ? ?  ?Past Medical History:  ?Diagnosis Date  ? Anemia   ? Anxiety   ? Chronic pain   ? Degenerative disc disease, cervical   ? Depression   ? Heart murmur   ? "when I was younger; it closed up"  ? History of blood transfusion 1993  ? "related to the C-section  ? Migraine   ? "a couple times/yr" (02/25/2014)  ? Muscle spasm   ? Pyelonephritis 02/25/2014  ? ? ? ?Physical Exam  ?Triage Vital Signs: ?ED Triage Vitals  ?Enc Vitals Group  ?   BP   ?   Pulse   ?   Resp   ?   Temp   ?   Temp src   ?   SpO2   ?   Weight   ?   Height   ?   Head Circumference   ?   Peak Flow   ?   Pain Score   ?   Pain Loc   ?   Pain Edu?   ?   Excl. in [place]?   ? ? ?Most recent vital signs: ?Vitals:  ? 08/25/21 1430 08/25/21 1500  ?BP: 111/64 112/69  ?Pulse: 63 63  ?Resp: 17 17  ?Temp:    ?SpO2: 99% 99%  ? ? ?General: Awake, no distress.  ?CV:  Good peripheral perfusion.  2+ radial pulse. ?Resp:  Normal effort.  Clear bilaterally ?Abd:  No distention.  Soft. ?Other:  Patient does have right  pronator drift.  She has fine tremor in the upper extremities.  Cranial doctors 12 are grossly intact.  No finger dysmetria.  He otherwise seems to have full strength in the left upper extremity and bilateral lower extremities.  Sensation is intact to light touch throughout all she states he feels a little less in the hands than that this morning. ? ? ?ED Results / Procedures / Treatments  ?Labs ?(all labs ordered are listed, but only abnormal results are displayed) ?Labs Reviewed  ?CBC - Abnormal; Notable for the following components:  ?    Result Value  ? Hemoglobin 8.8 (*)   ? HCT 29.6 (*)   ? MCV 71.3 (*)   ? MCH 21.2 (*)   ? MCHC 29.7 (*)   ? RDW 22.0 (*)   ? All other components within normal limits  ?COMPREHENSIVE METABOLIC PANEL - Abnormal; Notable for the following components:  ? Creatinine, Ser 1.01 (*)   ? All other components  within normal limits  ?RESP PANEL BY RT-PCR (FLU A&B, COVID) [id number]  ?ETHANOL  ?PROTIME-INR  ?APTT  ?DIFFERENTIAL  ?URINE DRUG SCREEN, QUALITATIVE ([facility] ONLY)  ?URINALYSIS, ROUTINE W REFLEX MICROSCOPIC  ?POC URINE PREG, ED  ? ? ? ?EKG ? ?EKG is remarkable sinus rhythm with ventricular rate of 65, normal axis, unremarkable intervals without evidence of acute ischemia or significant arrhythmia. ? ? ?RADIOLOGY ? ?CT head without contrast on my interpretation without evidence of ischemia, hemorrhage, mass effect or edema.  Also viewed radiology interpretation. ? ? ?PROCEDURES: ? ?Critical Care performed: Yes, see critical care procedure note(s) ? ?.Critical Care ?Performed by: Lucrezia Starch, MD ?Authorized by: Lucrezia Starch, MD  ? ?Critical care provider statement:  ?  Critical care time (minutes):  30 ?  Critical care was necessary to treat or prevent imminent or life-threatening deterioration of the following conditions:  CNS failure or compromise ?  Critical care was time spent personally by me on the following activities:  Development of treatment plan with patient or  surrogate, discussions with consultants, evaluation of patient's response to treatment, examination of patient, ordering and review of laboratory studies, ordering and review of radiographic studies, ordering and performing treatments and interventions, pulse oximetry, re-evaluation of patient's condition and review of old charts ?The patient is on the cardiac monitor to evaluate for evidence of arrhythmia and/or significant heart rate changes ? ? ?MEDICATIONS ORDERED IN ED: ?Medications - No data to display ? ? ?IMPRESSION / MDM / ASSESSMENT AND PLAN / ED COURSE  ?I reviewed the triage vital signs and the nursing notes. ?             ?               ? ?Differential diagnosis includes, but is not limited t to CVA, cervical radiculopathy, metabolic derangements, atypical migraine, with lower suspicion based on history and exam for preceding trauma or acute infectious process.  No history or exam features to suggest acute intoxication. ? ?CT head is negative.  EKG shows no significant arrhythmia. ? ?Patient was made code stroke prior to arrival.  Seen by Dr. Cheral Marker and decision made although was considered to defer tPA given CT head is unremarkable with plan to obtain MR brain. ? ?Serum ethanol undetectable.  COVID influenza PCR negative.  INR within normal limits.  BC with WBC count of 8, hemoglobin of 8.8 compared to 10.59 months ago and normal platelets.  CMP without any significant electrolyte or metabolic derangements. ? ?Care patient signed over to assuming father approximately 1538 with plan to follow-up MRI and discussed results with neurology and dispo following MRI. ? ?  ? ? ?FINAL CLINICAL IMPRESSION(S) / ED DIAGNOSES  ? ?Final diagnoses:  ?Weakness  ?Anemia, unspecified type  ? ? ? ?Rx / DC Orders  ? ?ED Discharge Orders   ? ? None  ? ?  ? ? ? ?Note:  This document was prepared using Dragon voice recognition software and may include unintentional dictation errors. ?  ?Lucrezia Starch, MD ?08/25/21  1553 ? ?

## 2021-08-25 NOTE — Progress Notes (Signed)
?   08/25/21 1325  ?Clinical Encounter Type  ?Visited With Patient and family together  ?Visit Type Initial;Code;Social support;Spiritual support  ? ?Chaplain Burris checked-in with care team after Pt returned from CT. Assisted Pt's spouse to come back to room and [employer]. Let Pt know of my presence and offered non-anxious support. ?

## 2021-08-25 NOTE — ED Provider Notes (Signed)
Patient received in signout from Dr. Hulan Saas pending MRIs and reassessment.  MRIs obtained and are reassuring without evidence of acute stroke or vascular occlusion.  I consult with neurology, Dr. Cheral Marker, who recommends outpatient management for this patient.  I reassessed the patient and she does report feeling a little bit better.  We discussed microcytic anemia and likely iron deficiency anemia.  We discussed iron supplements for this, following up with PCP and return precautions for the ED. ?  ?Vladimir Crofts, MD ?08/25/21 1735 ? ?

## 2021-08-25 NOTE — Consult Note (Signed)
?                    NEURO HOSPITALIST CONSULT NOTE  ? ?Requestig physician: Dr. Tamala Julian ? ?Reason for Consult: Acute onset of bilateral foot, left hand and RUE numbness with diffuse tremor ? ?History obtained from:  Patient and Chart    ? ?HPI:                                                                                                                                         ? Gabriella Snyder is an 46 y.o. left handed female with a PMHx of anemia, chronic pain, degenerative disc disease, depression and migraine headaches who presents to the ED via EMS for evaluation of acute onset numbness in her bilateral hands and feet, as well as numbness along the length of her RUE up to the shoulder. Symptoms began at 1020 this morning. She also endorsed some weakness in the bilateral hands, more so on the right. The left handed weakness has been intermittent over the past week and she has been dropping things, including her cigarettes when she tries to smoke. No prior similar episodes. Code Stroke was called in the ED.  ? ? ?Past Medical History:  ?Diagnosis Date  ? Anemia   ? Anxiety   ? Chronic pain   ? Degenerative disc disease, cervical   ? Depression   ? Heart murmur   ? "when I was younger; it closed up"  ? History of blood transfusion 1993  ? "related to the C-section  ? Migraine   ? "a couple times/yr" (02/25/2014)  ? Muscle spasm   ? Pyelonephritis 02/25/2014  ? ? ?Past Surgical History:  ?Procedure Laterality Date  ? [place]  ? DILATION AND CURETTAGE OF UTERUS  2003  ? OPEN REDUCTION INTERNAL FIXATION (ORIF) METACARPAL Right 12/12/2019  ? Procedure: OPEN REDUCTION INTERNAL FIXATION (ORIF) MIDDLE AND DISTAL PHALANX FRACTURE;  Surgeon: Leanora Cover, MD;  Location: [facility];  Service: Orthopedics;  Laterality: Right;  ? TUBAL LIGATION  2011  ? TUMOR EXCISION Right ~ 2004  ? "off my back"  ? ? ?Family History  ?Problem Relation Age of Onset  ? Diabetes Mother   ? Hypertension Father   ?  Cancer Maternal Grandfather   ? Cancer Maternal Aunt   ?     cervical  ? Cancer Cousin   ?     lymphoma  ? Stroke Paternal Grandfather   ?          ? ?Social History:  reports that she has been smoking cigarettes. She has a 1.50 pack-year smoking history. She has never used smokeless tobacco. She reports current alcohol use. She reports current drug use. Drug: Marijuana. ? ?No Known Allergies ? ?HOME MEDICATIONS:                                                                                                                     ? ?  No current facility-administered medications on file prior to encounter.  ? ?Current Outpatient Medications on File Prior to Encounter  ?Medication Sig Dispense Refill  ? ascorbic acid (VITAMIN C) 500 MG tablet Take 500 mg by mouth daily.    ? cyclobenzaprine (FLEXERIL) 5 MG tablet Take 1 tablet (5 mg total) by mouth 3 (three) times daily as needed for muscle spasms. (Patient taking differently: Take 5 mg by mouth 2 (two) times daily as needed for muscle spasms. ) 20 tablet 0  ? ergocalciferol (VITAMIN D2) 1.25 MG (50000 UT) capsule Take 50,000 Units by mouth once a week.    ? ferrous sulfate (FERROUSUL) 325 (65 FE) MG tablet Take 1 tablet (325 mg total) by mouth every other day. 30 tablet 3  ? gabapentin (NEURONTIN) 300 MG capsule Take 1 capsule (300 mg total) by mouth 3 (three) times daily. 21 capsule 0  ? ibuprofen (ADVIL) 800 MG tablet Take 800 mg by mouth every 8 (eight) hours as needed for mild pain or moderate pain.    ? Multiple Vitamin (MULTIVITAMIN) tablet Take 1 tablet by mouth daily.    ? Norethindrone Acetate-Ethinyl Estrad-FE (LOESTRIN 24 FE) 1-20 MG-MCG(24) tablet Take 1 tablet by mouth daily. 28 tablet 11  ? oxycodone-acetaminophen (LYNOX) 10-300 MG tablet Take 1 tablet by mouth every 4 (four) hours as needed for pain.    ? ? ? ?ROS:                                                                                                                                       ?As per  HPI. Detailed ROS deferred due to acuity of presentation.  ? ? ?BP 105/79   Pulse (!) 59   Temp 98.1 ?F (36.7 ?C) (Oral)   Resp 19   Ht '5\' 10"'$  (1.778 m)   Wt 108.1 kg   SpO2 100%   BMI 34.21 kg/m?  ? ? ? ?General Examination:                                                                                                      ? ?Physical Exam  ?HEENT-  [place]/AT   ?Lungs- Respirations unlabored ?Extremities- No edema ? ?Neurological Examination ?Mental Status: Awake and alert. Fully oriented. Fatigued-appearing with mildly anxious affect. Speech fluent with intact comprehension and naming.  ?Cranial Nerves: ?II: Temporal visual fields intact with no extinction to DSS. PERRL.   ?III,IV, VI: EOMI. Mild bilateral ptosis in the context of fatigued appearance.  No nystagmus.  ?V,VII: Smile symmetric ?VIII: Hearing intact to conversation ?IX,X: No hypophonia or hoarseness ?XI: Symmetric shoulder shrug ?XII: Midline tongue extension ?Motor: ?Right : Upper extremity   5/5    Left:     Upper extremity   5/5 ? Lower extremity   5/5     Lower extremity   5/5 ?No pronator drift ?Sensory: FT intact without subjective dysesthesia to BUE and BLE including hands and feet. Temp sensation intact x 4 including hands.  ?Deep Tendon Reflexes: 2+ and symmetric throughout ?Cerebellar: No ataxia with FNFand H-S  bilaterally. Diffuse tremor of upper and lower extremities distally is seen at rest, waxes and wanes spontaneously, and is distractable.  ?Gait: Slow and mildly ataxic gait which is also with somewhat stooped posture ?  ?Lab Results: ?Basic Metabolic Panel: ?No results for input(s): NA, K, CL, CO2, GLUCOSE, BUN, CREATININE, CALCIUM, MG, PHOS in the last 168 hours. ? ?CBC: ?Recent Labs  ?Lab 08/25/21 ?1316  ?WBC 8.0  ?HGB 8.8*  ?HCT 29.6*  ?MCV 71.3*  ?PLT 219  ? ? ?Cardiac Enzymes: ?No results for input(s): CKTOTAL, CKMB, CKMBINDEX, TROPONINI in the last 168 hours. ? ?Lipid Panel: ?No results for input(s): CHOL, TRIG, HDL,  CHOLHDL, VLDL, LDLCALC in the last 168 hours. ? ?Imaging: ?CT HEAD CODE STROKE WO CONTRAST ? ?Result Date: 08/25/2021 ?CLINICAL DATA:  Code stroke.  Right-sided weakness EXAM: CT HEAD WITHOUT CONTRAST TECHNIQUE: Contiguous axial images were obtained from the base of the skull through the vertex without intravenous contrast. RADIATION DOSE REDUCTION: This exam was performed according to the departmental dose-optimization program which includes automated exposure control, adjustment of the mA and/or kV according to patient size and/or use of iterative reconstruction technique. COMPARISON:  None. FINDINGS: Brain: No evidence of acute infarction, hemorrhage, cerebral edema, mass, mass effect, or midline shift. Ventricles and sulci are normal for age. No extra-axial fluid collection. Vascular: No hyperdense vessel or unexpected calcification. Skull: Negative for fracture. Focal area of calvarial thinning over the left frontal lobe (series 5, image 41), of indeterminate etiology or clinical significance. Sinuses/Orbits: No acute finding. Other: The mastoid air cells are well aerated. ASPECTS [facility] Stroke Program Early CT Score) - Ganglionic level infarction (caudate, lentiform nuclei, internal capsule, insula, M1-M3 cortex): 7 - Supraganglionic infarction (M4-M6 cortex): 3 Total score (0-10 with 10 being normal): 10 IMPRESSION: 1. No acute intracranial process. 2. ASPECTS is 10 Code stroke imaging results were communicated on 08/25/2021 at 1:32 pm to provider Dr. Cheral Marker via secure text paging. Electronically Signed   By: Merilyn Baba M.D.   On: 08/25/2021 13:32   ? ? ?Assessment: 46 year old female presenting with acute onset of bilateral foot, left hand and RUE numbness with diffuse tremor.  ?1. Exam reveals intermittent diffuse tremoring that waxes and wanes in frequency and amplitude and is distractable. No focal weakness or sensory loss on exam in the context of the patient's subjective sensations of numbness.  ?2.  CT head: No acute abnormality.  ?3. Overall presentation is most consistent with an anxiety attack, conversion disorder or factitious disorder. Stroke is low on the DDx, but should be ruled out with MRI.  ? ?Recom

## 2021-08-25 NOTE — ED Notes (Signed)
Pt taken to MRI at this time

## 2021-08-25 NOTE — ED Triage Notes (Signed)
Pt via [facility] from work, pt states while they were at work, around 1020 they began to feel numbness in bilateral feet, bilateral hands, and in R arm up to shoulder. Pt reports they have noted weakness in their L hand randomly for approximately a week, stated she would randomly drop her cigarettes. Pt reports weakness in R arm felt different today though. Noted tremors to bilateral legs, R arm, and in face when pt closed their eyes.  ?

## 2021-09-07 ENCOUNTER — Encounter: Payer: Self-pay | Admitting: General Practice

## 2021-09-28 IMAGING — MG DIGITAL SCREENING BILAT W/ TOMO W/ CAD
6 of 10 series · 6 of 30 positions shown · non-contrast
Comparison: Previous exam(s).

CLINICAL DATA: Screening.

EXAM:
DIGITAL SCREENING BILATERAL MAMMOGRAM WITH TOMO AND CAD

[R CC synth-2D]
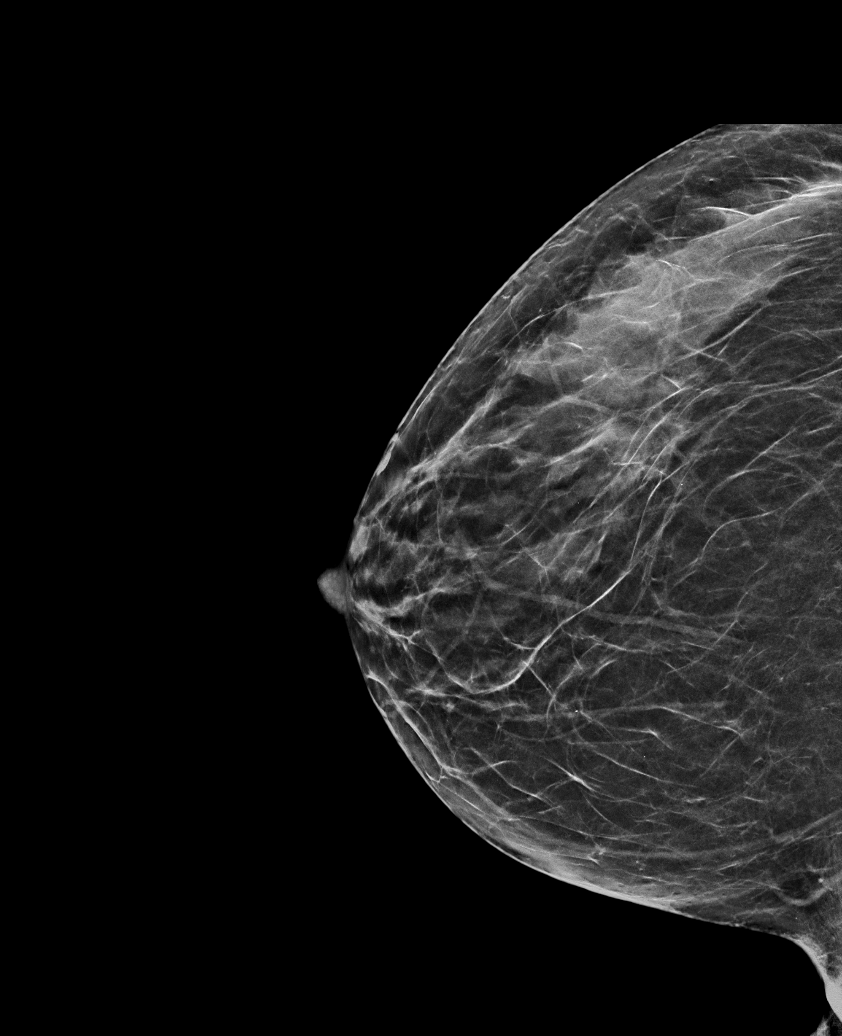

[L MLO synth-2D (1 of 2)]
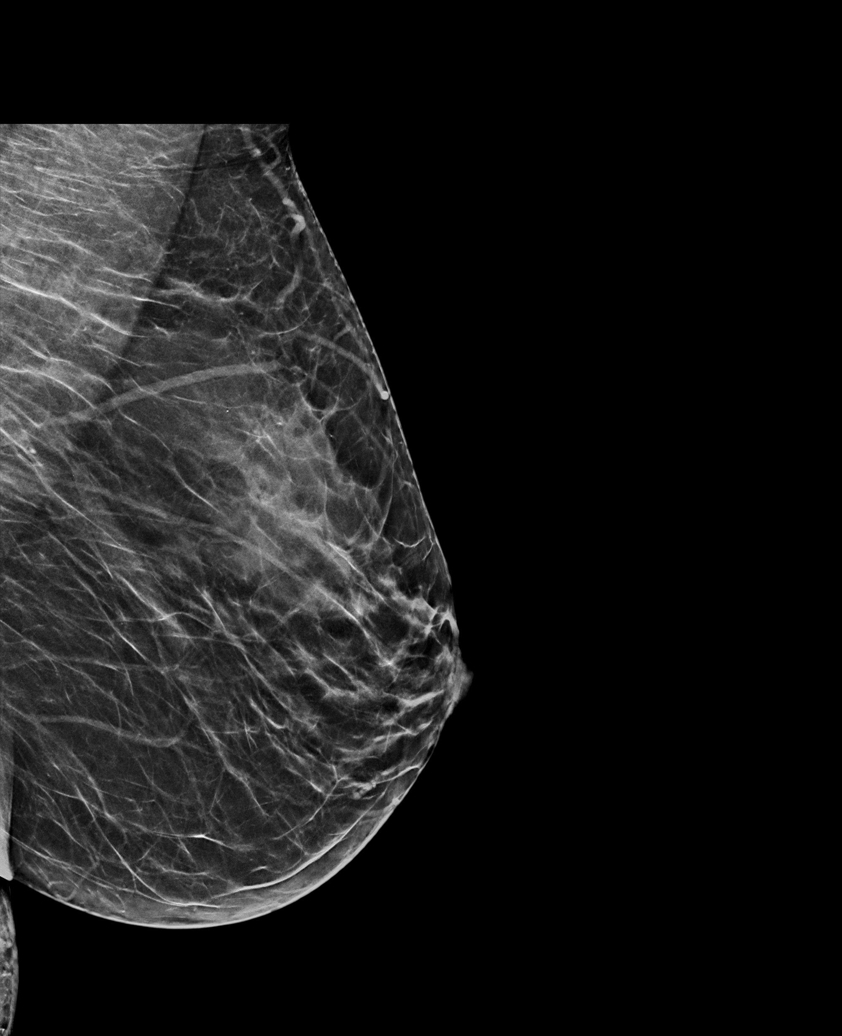

[L MLO synth-2D (2 of 2)]
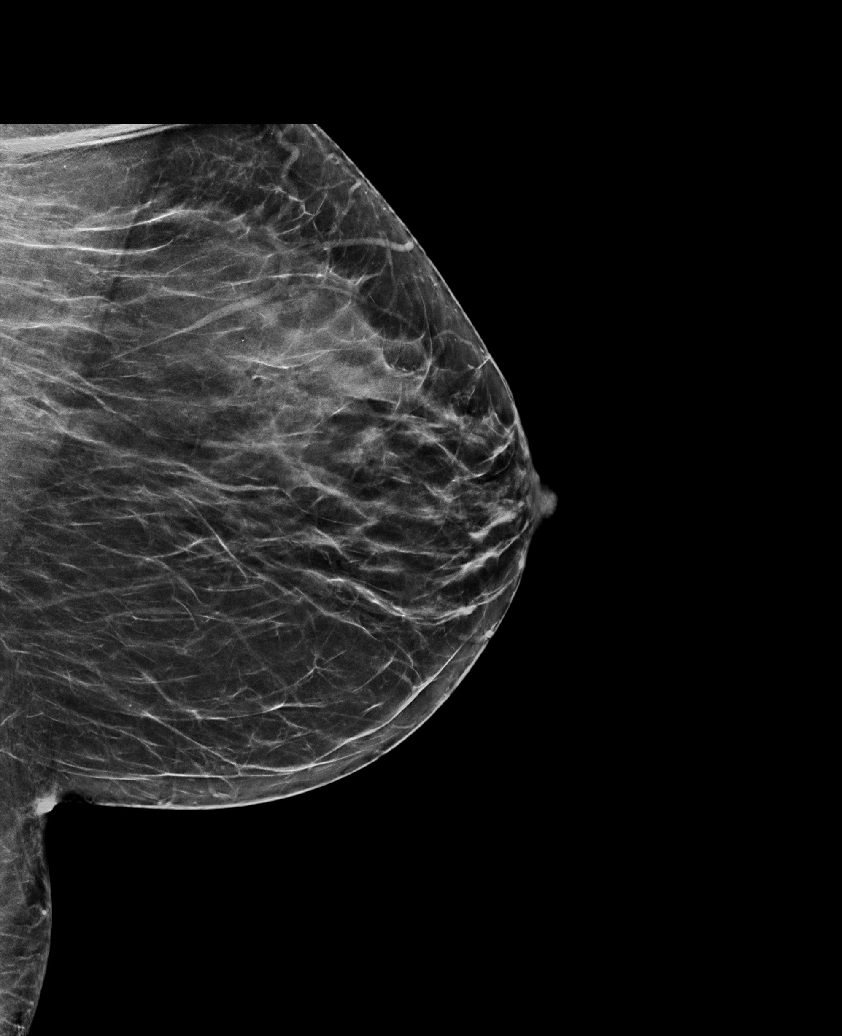

[L CC synth-2D]
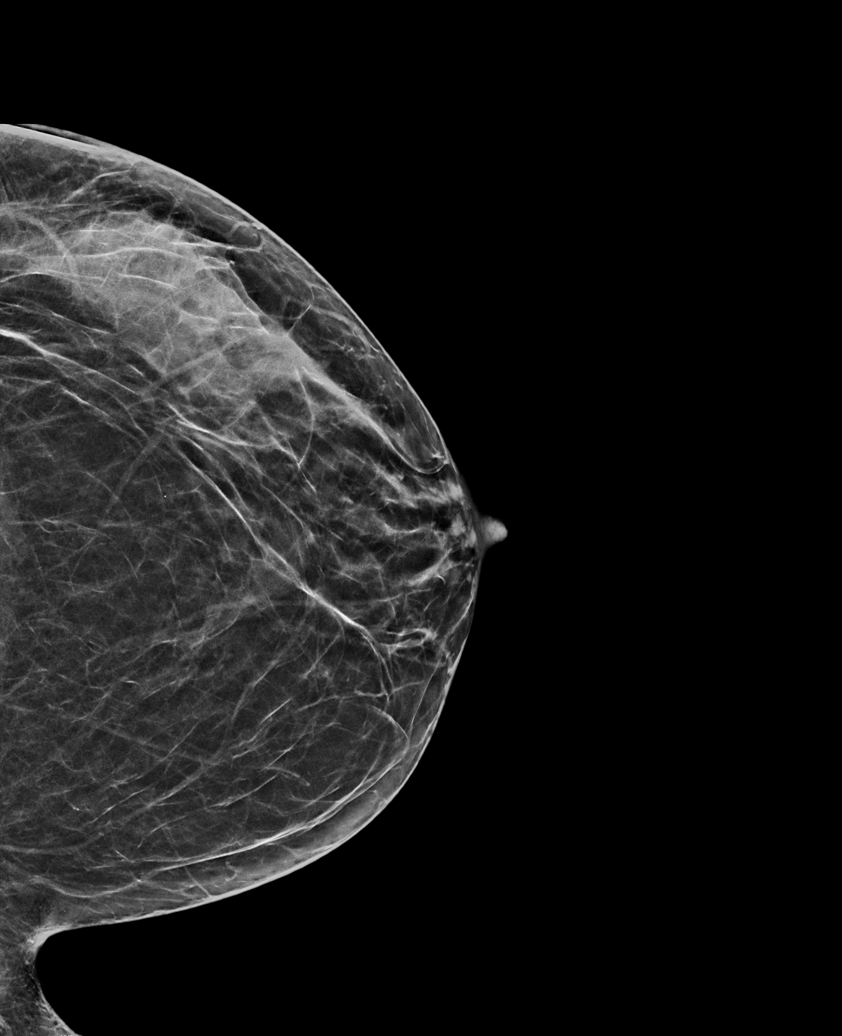

[R MLO synth-2D]
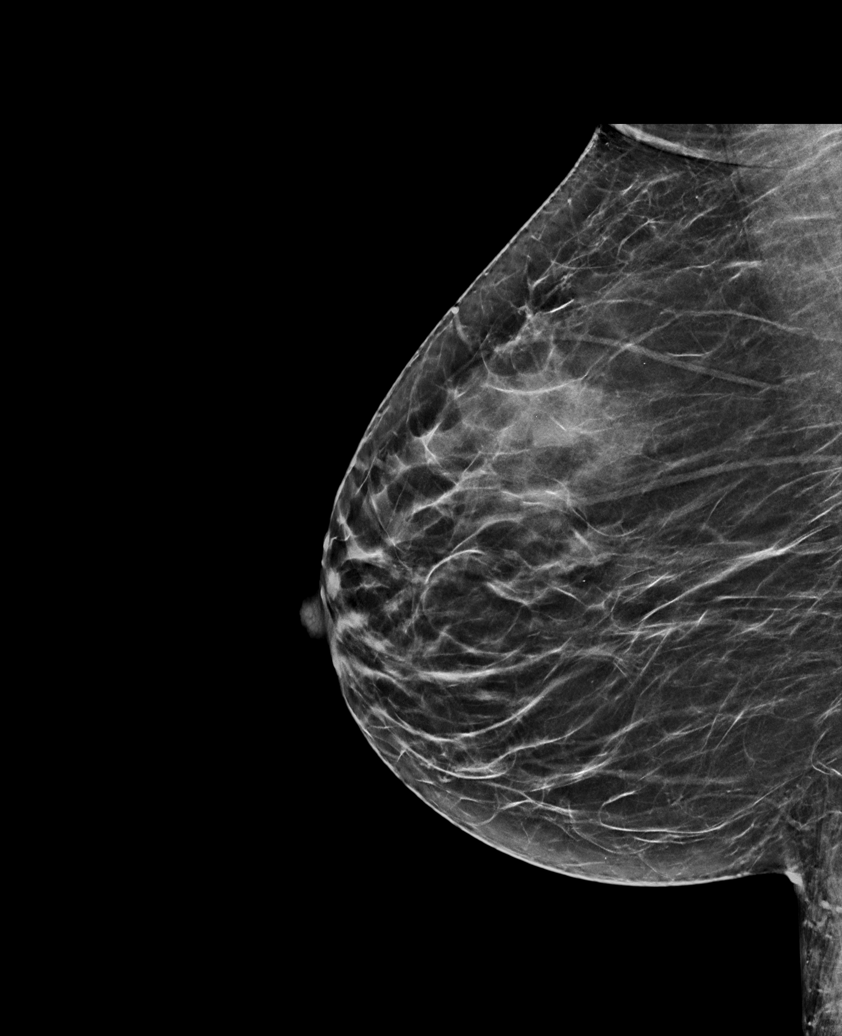

[L MLO tomo · tomo slice 29/58.0]
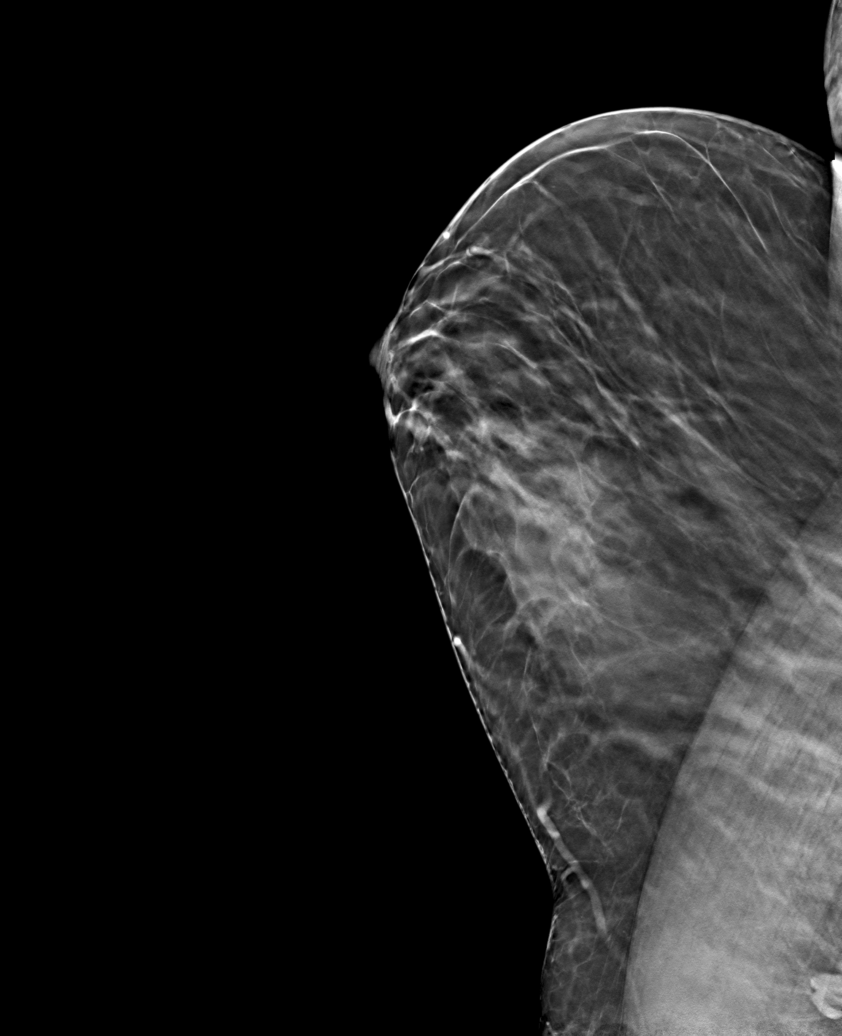

[6 of 30 positions shown; findings below may reference images not displayed]

ACR Breast Density Category b: There are scattered areas of
fibroglandular density.
FINDINGS: There are no findings suspicious for malignancy. Images were
processed with CAD.
IMPRESSION: No mammographic evidence of malignancy. A result letter of this
screening mammogram will be mailed directly to the patient.

RECOMMENDATION:
Screening mammogram in one year. (Code:[id number])

BI-RADS CATEGORY  1: Negative.

## 2021-10-21 ENCOUNTER — Encounter (HOSPITAL_COMMUNITY): Payer: Self-pay

## 2021-10-21 ENCOUNTER — Other Ambulatory Visit: Payer: Self-pay

## 2021-10-21 ENCOUNTER — Emergency Department (HOSPITAL_COMMUNITY)
Admission: EM | Admit: 2021-10-21 | Discharge: 2021-10-21 | Discharge status: Against Medical Advice | Payer: BC Managed Care – PPO | Attending: Emergency Medicine | Admitting: Emergency Medicine

## 2021-10-21 DIAGNOSIS — Y990 Civilian activity done for income or pay: Secondary | ICD-10-CM | POA: Diagnosis not present

## 2021-10-21 DIAGNOSIS — Z5321 Procedure and treatment not carried out due to patient leaving prior to being seen by health care provider: Secondary | ICD-10-CM | POA: Diagnosis not present

## 2021-10-21 DIAGNOSIS — M545 Low back pain, unspecified: Secondary | ICD-10-CM | POA: Diagnosis present

## 2021-10-21 DIAGNOSIS — X509XXA Other and unspecified overexertion or strenuous movements or postures, initial encounter: Secondary | ICD-10-CM | POA: Diagnosis not present

## 2021-10-21 DIAGNOSIS — R519 Headache, unspecified: Secondary | ICD-10-CM | POA: Diagnosis not present

## 2021-10-21 NOTE — ED Triage Notes (Signed)
Patient c/o left lower back pain after pushing on something at work yesterday.. Patient states the pain shot up from the left lower back to the left side of her head and radiates down to the left leg and foot.

## 2021-11-10 ENCOUNTER — Telehealth: Payer: Self-pay | Admitting: Physician Assistant

## 2021-11-10 NOTE — Telephone Encounter (Signed)
Scheduled appt per 7/3 referral. Pt is aware of appt date and time. Pt is aware to arrive 15 mins prior to appt time and to bring and updated insurance card. Pt is aware of appt location.

## 2021-12-01 ENCOUNTER — Ambulatory Visit: Payer: BC Managed Care – PPO | Admitting: Family Medicine

## 2021-12-02 NOTE — Progress Notes (Signed)
[place] [phone]   Fax:([phone] [place] NOTE  Patient Care Team: Carylon Perches, NP as PCP - General (Family Medicine)  Hematological/Oncological History 1) Labs from PCP Dr. Forbes Cellar at [place]: -Hgb electrophoresis showed beta thalassemia minor.  -Ferritin 9  2) 12/03/2021: Establish care with [facility] Hematology  CHIEF COMPLAINTS/PURPOSE OF CONSULTATION:  Microcytic anemia, iron deficiency, beta-thalassemia minor.   HISTORY OF PRESENTING ILLNESS:  Gabriella Snyder 46 y.o. female presents to hematology clinic for iron deficiency anemia. She is unaccompanied for this visit.   On exam today, Gabriella Snyder reports that since starting her prenatal vitamins with iron., her dizziness has resolved. She still has some fatigue but can complete her ADLs on her own. She denies nausea, vomiting or abdominal pain. Her bowel habits are unchanged without any recurrent episodes of diarrhea or constipation. She reports having heavy menstrual cycles for 4-5 years with cycles every 2-4 weeks. Ever since she starting taking prenatal vitamins with iron in April 2023, her periods are back to every 4 weeks. Each cycle is 5 days with 3 days of heavy bleeding. She changes her pad nine times a day due to hygenic purposes. She denies fevers, chills, night sweats, shortness of breath, chest pain or cough. She has no other complaints. Rest of the 10 point ROS is below.    MEDICAL HISTORY:  Past Medical History:  Diagnosis Date   Anemia    Anxiety    Chronic pain    Degenerative disc disease, cervical    Depression    Heart murmur    "when I was younger; it closed up"   History of blood transfusion 1993   "related to the C-section   Migraine    "a couple times/yr" (02/25/2014)   Muscle spasm    Pyelonephritis 02/25/2014    SURGICAL HISTORY: Past Surgical History:  Procedure Laterality Date   [place] OF  UTERUS  2003   OPEN REDUCTION INTERNAL FIXATION (ORIF) METACARPAL Right 12/12/2019   Procedure: OPEN REDUCTION INTERNAL FIXATION (ORIF) MIDDLE AND DISTAL PHALANX FRACTURE;  Surgeon: Leanora Cover, MD;  Location: [place];  Service: Orthopedics;  Laterality: Right;   TUBAL LIGATION  2011   TUMOR EXCISION Right ~ 2004   "off my back"    SOCIAL HISTORY: Social History   Socioeconomic History   Marital status: Single    Spouse name: Not on file   Number of children: Not on file   Years of education: Not on file   Highest education level: Not on file  Occupational History   Not on file  Tobacco Use   Smoking status: Every Day    Packs/day: 0.25    Years: 6.00    Total pack years: 1.50    Types: Cigarettes   Smokeless tobacco: Never  Vaping Use   Vaping Use: Never used  Substance and Sexual Activity   Alcohol use: Not Currently   Drug use: Yes    Types: Marijuana    Comment: 3 nights ago    Sexual activity: Yes    Birth control/protection: Pill  Other Topics Concern   Not on file  Social History Narrative   Not on file   Social Determinants of Health   Financial Resource Strain: Not on file  Food Insecurity: Not on file  Transportation Needs: Not on file  Physical Activity: Not on file  Stress: Not on file  Social Connections: Not  on file  Intimate Partner Violence: Not on file    FAMILY HISTORY: Family History  Problem Relation Age of Onset   Diabetes Mother    Hypertension Father    Cancer Maternal Grandfather    Stroke Paternal Grandfather    Cancer Maternal Aunt        cervical   Cancer Cousin        lymphoma    ALLERGIES:  has No Known Allergies.  MEDICATIONS:  Current Outpatient Medications  Medication Sig Dispense Refill   ascorbic acid (VITAMIN C) 500 MG tablet Take 500 mg by mouth daily.     ergocalciferol (VITAMIN D2) 1.25 MG (50000 UT) capsule Take 50,000 Units by mouth once a week.     ibuprofen (ADVIL) 800 MG tablet Take 800  mg by mouth every 8 (eight) hours as needed for mild pain or moderate pain.     Multiple Vitamin (MULTIVITAMIN) tablet Take 1 tablet by mouth daily.     oxycodone-acetaminophen (LYNOX) 10-300 MG tablet Take 1 tablet by mouth every 4 (four) hours as needed for pain.     Prenatal Multivit-Min-Fe-FA (PRE-NATAL PO) Take by mouth daily.     cyclobenzaprine (FLEXERIL) 5 MG tablet Take 1 tablet (5 mg total) by mouth 3 (three) times daily as needed for muscle spasms. (Patient not taking: Reported on 12/03/2021) 20 tablet 0   ferrous sulfate (FERROUSUL) 325 (65 FE) MG tablet Take 1 tablet (325 mg total) by mouth every other day. (Patient not taking: Reported on 12/03/2021) 30 tablet 3   gabapentin (NEURONTIN) 300 MG capsule Take 1 capsule (300 mg total) by mouth 3 (three) times daily. (Patient not taking: Reported on 12/03/2021) 21 capsule 0   Norethindrone Acetate-Ethinyl Estrad-FE (LOESTRIN 24 FE) 1-20 MG-MCG(24) tablet Take 1 tablet by mouth daily. (Patient not taking: Reported on 12/03/2021) 28 tablet 11   No current facility-administered medications for this visit.    REVIEW OF SYSTEMS:   Constitutional: ( - ) fevers, ( - )  chills , ( - ) night sweats Eyes: ( - ) blurriness of vision, ( - ) double vision, ( - ) watery eyes Ears, nose, mouth, throat, and face: ( - ) mucositis, ( - ) sore throat Respiratory: ( - ) cough, ( - ) dyspnea, ( - ) wheezes Cardiovascular: ( - ) palpitation, ( - ) chest discomfort, ( - ) lower extremity swelling Gastrointestinal:  ( - ) nausea, ( - ) heartburn, ( - ) change in bowel habits Skin: ( - ) abnormal skin rashes Lymphatics: ( - ) new lymphadenopathy, ( - ) easy bruising Neurological: ( - ) numbness, ( - ) tingling, ( - ) new weaknesses Behavioral/Psych: ( - ) mood change, ( - ) new changes  All other systems were reviewed with the patient and are negative.  PHYSICAL EXAMINATION: ECOG PERFORMANCE STATUS: 1 - Symptomatic but completely ambulatory  Vitals:    12/03/21 1420  BP: 126/68  Pulse: 64  Resp: 15  Temp: 98.1 F (36.7 C)  SpO2: 100%   Filed Weights   12/03/21 1420  Weight: 241 lb 11.2 oz (109.6 kg)    GENERAL: well appearing female in NAD  SKIN: skin color, texture, turgor are normal, no rashes or significant lesions EYES: conjunctiva are pink and non-injected, sclera clear OROPHARYNX: no exudate, no erythema; lips, buccal mucosa, and tongue normal  NECK: supple, non-tender LYMPH:  no palpable lymphadenopathy in the cervical or supraclavicular lymph nodes.  LUNGS: clear to auscultation and  percussion with normal breathing effort HEART: regular rate & rhythm and no murmurs and no lower extremity edema ABDOMEN: soft, non-tender, non-distended, normal bowel sounds Musculoskeletal: no cyanosis of digits and no clubbing  PSYCH: alert & oriented x 3, fluent speech NEURO: no focal motor/sensory deficits  LABORATORY DATA:  I have reviewed the data as listed    Latest Ref Rng & Units 12/03/2021    4:17 PM 08/25/2021    1:16 PM 11/23/2020    9:44 AM  CBC  WBC 4.0 - 10.5 K/uL 9.9  8.0  10.3   Hemoglobin 12.0 - 15.0 g/dL 12.1  8.8  10.5   Hematocrit 36.0 - 46.0 % 38.1  29.6  34.2   Platelets 150 - 400 K/uL 230  219  301        Latest Ref Rng & Units 12/03/2021    4:17 PM 08/25/2021    1:16 PM 06/30/2020    2:55 PM  CMP  Glucose 70 - 99 mg/dL 95  99  107   BUN 6 - 20 mg/dL '9  10  10   '$ Creatinine 0.44 - 1.00 mg/dL 1.05  1.01  0.89   Sodium 135 - 145 mmol/L 139  136  137   Potassium 3.5 - 5.1 mmol/L 4.5  3.9  3.7   Chloride 98 - 111 mmol/L 108  104  106   CO2 22 - 32 mmol/L '26  23  19   '$ Calcium 8.9 - 10.3 mg/dL 9.4  9.0  9.6   Total Protein 6.5 - 8.1 g/dL 8.0  7.5  7.9   Total Bilirubin 0.3 - 1.2 mg/dL 0.6  0.7  0.6   Alkaline Phos 38 - 126 U/L 53  53  57   AST 15 - 41 U/L '14  21  15   '$ ALT 0 - 44 U/L '7  9  11      '$ ASSESSMENT & PLAN Gabriella Snyder is a 46 y.o.female who presents to the clinic for initial evaluation for  iron deficiency anemia.   #Iron Deficiency Anemia: --Secondary heavy/irregular menstrual bleeding --Not taking birth control as she did not want to increase risk of blood clots with daily smoking --Unable to tolerate PO due to GI intolerance with constipation --currently taking prenatal vitamin with added iron. 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  --recommend labs today to check CBC, CMP, ferritin, iron and TIBC.  --If there is persistent iron deficiency, we will recommend IV iron to help bolster levels.  --RTC in 3 months with repeat labs.   Orders Placed This Encounter  Procedures   CBC with Differential ([place] Only)    Standing Status:   Future    Number of Occurrences:   1    Standing Expiration Date:   12/03/2022   CMP ([place] only)    Standing Status:   Future    Number of Occurrences:   1    Standing Expiration Date:   12/03/2022   Ferritin    Standing Status:   Future    Number of Occurrences:   1    Standing Expiration Date:   12/03/2022   Iron and Iron Binding Capacity ([facility] only)    Standing Status:   Future    Number of Occurrences:   1    Standing Expiration Date:   12/03/2022   Retic Panel    Standing Status:   Future    Number of Occurrences:  1    Standing Expiration Date:   12/03/2022    All questions were answered. The patient knows to call the clinic with any problems, questions or concerns.  I have spent a total of 60 minutes minutes of face-to-face and non-face-to-face time, preparing to see the patient, obtaining and/or reviewing separately obtained history, performing a medically appropriate examination, counseling and educating the patient, ordering tests/procedures, documenting clinical information in the electronic health record,and care coordination.   Dede Query, PA-C Department of Hematology/Oncology [place] at [facility] Phone:  [phone]  Patient was seen with Dr. Lorenso Courier  I have read the above note and personally examined the patient. I agree with the assessment and plan as noted above.  Briefly Gabriella Snyder is a 46 year old female who presents for evaluation of iron deficiency anemia secondary to heavy menstrual bleeding.  Patient has a poor tolerance to p.o. iron therapy in the past.  In the event she is found to have persistent iron deficiency anemia today would recommend pursuing IV iron therapy.  Of note the patient does have beta thalassemia minor which may be contributing to her persistent microcytosis.  The patient voiced understanding of the plan moving forward.  Return to clinic pending results of the above studies.   Ledell Peoples, MD Department of Hematology/Oncology [place] at [facility] Phone: [phone] Pager: [phone] Email: [email or web address]

## 2021-12-03 ENCOUNTER — Inpatient Hospital Stay: Payer: BC Managed Care – PPO

## 2021-12-03 ENCOUNTER — Other Ambulatory Visit: Payer: Self-pay

## 2021-12-03 ENCOUNTER — Inpatient Hospital Stay: Payer: BC Managed Care – PPO | Attending: Physician Assistant | Admitting: Physician Assistant

## 2021-12-03 VITALS — BP 126/68 | HR 64 | Temp 98.1°F | Resp 15 | Wt 241.7 lb

## 2021-12-03 DIAGNOSIS — D500 Iron deficiency anemia secondary to blood loss (chronic): Secondary | ICD-10-CM

## 2021-12-03 DIAGNOSIS — Z807 Family history of other malignant neoplasms of lymphoid, hematopoietic and related tissues: Secondary | ICD-10-CM | POA: Diagnosis not present

## 2021-12-03 DIAGNOSIS — D509 Iron deficiency anemia, unspecified: Secondary | ICD-10-CM

## 2021-12-03 DIAGNOSIS — Z808 Family history of malignant neoplasm of other organs or systems: Secondary | ICD-10-CM

## 2021-12-03 DIAGNOSIS — F17210 Nicotine dependence, cigarettes, uncomplicated: Secondary | ICD-10-CM | POA: Diagnosis not present

## 2021-12-03 DIAGNOSIS — D563 Thalassemia minor: Secondary | ICD-10-CM | POA: Diagnosis not present

## 2021-12-03 LAB — IRON AND IRON BINDING CAPACITY (CC-WL,HP ONLY)
Iron: 135 ug/dL (ref 28–170)
Saturation Ratios: 32 % — ABNORMAL HIGH (ref 10.4–31.8)
TIBC: 419 ug/dL (ref 250–450)
UIBC: 284 ug/dL

## 2021-12-03 LAB — CMP (CANCER CENTER ONLY)
ALT: 7 U/L (ref 0–44)
AST: 14 U/L — ABNORMAL LOW (ref 15–41)
Albumin: 4.2 g/dL (ref 3.5–5.0)
Alkaline Phosphatase: 53 U/L (ref 38–126)
Anion gap: 5 (ref 5–15)
BUN: 9 mg/dL (ref 6–20)
CO2: 26 mmol/L (ref 22–32)
Calcium: 9.4 mg/dL (ref 8.9–10.3)
Chloride: 108 mmol/L (ref 98–111)
Creatinine: 1.05 mg/dL — ABNORMAL HIGH (ref 0.44–1.00)
GFR, Estimated: >60 mL/min (ref >60)
Glucose, Bld: 95 mg/dL (ref 70–99)
Potassium: 4.5 mmol/L (ref 3.5–5.1)
Sodium: 139 mmol/L (ref 135–145)
Total Bilirubin: 0.6 mg/dL (ref 0.3–1.2)
Total Protein: 8 g/dL (ref 6.5–8.1)

## 2021-12-03 LAB — CBC WITH DIFFERENTIAL (CANCER CENTER ONLY)
Abs Immature Granulocytes: 0.02 10*3/uL (ref 0.00–0.07)
Basophils Absolute: 0.1 10*3/uL (ref 0.0–0.1)
Basophils Relative: 1 %
Eosinophils Absolute: 0.1 10*3/uL (ref 0.0–0.5)
Eosinophils Relative: 1 %
HCT: 38.1 % (ref 36.0–46.0)
Hemoglobin: 12.1 g/dL (ref 12.0–15.0)
Immature Granulocytes: 0 %
Lymphocytes Relative: 32 %
Lymphs Abs: 3.1 10*3/uL (ref 0.7–4.0)
MCH: 23.5 pg — ABNORMAL LOW (ref 26.0–34.0)
MCHC: 31.8 g/dL (ref 30.0–36.0)
MCV: 74 fL — ABNORMAL LOW (ref 80.0–100.0)
Monocytes Absolute: 0.4 10*3/uL (ref 0.1–1.0)
Monocytes Relative: 4 %
Neutro Abs: 6.2 10*3/uL (ref 1.7–7.7)
Neutrophils Relative %: 62 %
Platelet Count: 230 10*3/uL (ref 150–400)
RBC: 5.15 MIL/uL — ABNORMAL HIGH (ref 3.87–5.11)
RDW: 17.2 % — ABNORMAL HIGH (ref 11.5–15.5)
WBC Count: 9.9 10*3/uL (ref 4.0–10.5)
nRBC: 0 % (ref 0.0–0.2)

## 2021-12-03 LAB — RETIC PANEL
Immature Retic Fract: 17.1 % — ABNORMAL HIGH (ref 2.3–15.9)
RBC.: 5.04 MIL/uL (ref 3.87–5.11)
Retic Count, Absolute: 53.4 10*3/uL (ref 19.0–186.0)
Retic Ct Pct: 1.1 % (ref 0.4–3.1)
Reticulocyte Hemoglobin: 25.9 pg — ABNORMAL LOW (ref >27.9)

## 2021-12-03 LAB — FERRITIN: Ferritin: 9 ng/mL — ABNORMAL LOW (ref 11–307)

## 2021-12-06 ENCOUNTER — Telehealth: Payer: Self-pay | Admitting: Physician Assistant

## 2021-12-06 ENCOUNTER — Telehealth: Payer: Self-pay | Admitting: Pharmacy Technician

## 2021-12-06 ENCOUNTER — Encounter: Payer: Self-pay | Admitting: Physician Assistant

## 2021-12-06 NOTE — Telephone Encounter (Signed)
Unable to reach patient to review lab results and recommendations form 12/03/2021. Reached out to patient's father and discussed results. I will send mychart message to connect with patient.

## 2021-12-06 NOTE — Telephone Encounter (Signed)
Per 7/28 los called and spoke to pt about appointment pt confirmed appointment

## 2021-12-06 NOTE — Telephone Encounter (Signed)
Dr. Charlies Silvers,  Monoferric is non preferred medication and will likely be denied due to patient has not tried and or failed step therapy.  Feraheme Venofer Infed Would you like to try feraheme or venofer?

## 2021-12-07 ENCOUNTER — Other Ambulatory Visit: Payer: Self-pay | Admitting: Pharmacy Technician

## 2021-12-09 ENCOUNTER — Ambulatory Visit (INDEPENDENT_AMBULATORY_CARE_PROVIDER_SITE_OTHER): Payer: BC Managed Care – PPO

## 2021-12-09 VITALS — BP 126/80 | HR 64 | Temp 98.7°F | Resp 18 | Ht 70.0 in | Wt 242.4 lb

## 2021-12-09 DIAGNOSIS — D500 Iron deficiency anemia secondary to blood loss (chronic): Secondary | ICD-10-CM

## 2021-12-09 DIAGNOSIS — N920 Excessive and frequent menstruation with regular cycle: Secondary | ICD-10-CM

## 2021-12-09 MED ORDER — SODIUM CHLORIDE 0.9 % IV SOLN
510.0000 mg | Freq: Once | INTRAVENOUS | Status: AC
  Administered 2021-12-09: 510 mg via INTRAVENOUS
  Filled 2021-12-09: qty 17

## 2021-12-09 NOTE — Progress Notes (Signed)
Diagnosis: Iron Deficiency Anemia  Provider:  Marshell Garfinkel, MD  Procedure: Infusion  IV Type: Peripheral, IV Location: R Antecubital  Feraheme (Ferumoxytol), Dose: 510 mg  Infusion Start Time: 1100  Infusion Stop Time: 6269  Post Infusion IV Care: Observation period completed  Discharge: Condition: Good, Destination: Home . AVS provided to patient.   Performed by:  Paul Dykes, RN

## 2021-12-11 ENCOUNTER — Encounter: Payer: Self-pay | Admitting: Physician Assistant

## 2021-12-16 ENCOUNTER — Ambulatory Visit (INDEPENDENT_AMBULATORY_CARE_PROVIDER_SITE_OTHER): Payer: BC Managed Care – PPO

## 2021-12-16 VITALS — BP 112/76 | HR 71 | Temp 98.7°F | Resp 18 | Ht 70.5 in | Wt 241.2 lb

## 2021-12-16 DIAGNOSIS — D500 Iron deficiency anemia secondary to blood loss (chronic): Secondary | ICD-10-CM | POA: Diagnosis not present

## 2021-12-16 MED ORDER — SODIUM CHLORIDE 0.9 % IV SOLN
510.0000 mg | Freq: Once | INTRAVENOUS | Status: AC
  Administered 2021-12-16: 510 mg via INTRAVENOUS
  Filled 2021-12-16: qty 17

## 2021-12-16 NOTE — Progress Notes (Signed)
Diagnosis: Iron Deficiency Anemia  Provider:  Marshell Garfinkel MD  Procedure: Infusion  IV Type: Peripheral, IV Location: R Antecubital  Feraheme (Ferumoxytol), Dose: 510 mg  Infusion Start Time: 1216  Infusion Stop Time: 1518  Post Infusion IV Care: Peripheral IV Discontinued  Discharge: Condition: Good, Destination: Home . AVS provided to patient.   Performed by:  Adelina Mings, LPN

## 2021-12-18 ENCOUNTER — Emergency Department (HOSPITAL_COMMUNITY): Payer: BC Managed Care – PPO

## 2021-12-18 ENCOUNTER — Emergency Department (HOSPITAL_COMMUNITY)
Admission: EM | Admit: 2021-12-18 | Discharge: 2021-12-18 | Disposition: A | Discharge status: Home / Self Care | Payer: BC Managed Care – PPO | Attending: Emergency Medicine | Admitting: Emergency Medicine

## 2021-12-18 ENCOUNTER — Encounter (HOSPITAL_COMMUNITY): Payer: Self-pay | Admitting: Emergency Medicine

## 2021-12-18 ENCOUNTER — Other Ambulatory Visit: Payer: Self-pay

## 2021-12-18 DIAGNOSIS — F17210 Nicotine dependence, cigarettes, uncomplicated: Secondary | ICD-10-CM | POA: Insufficient documentation

## 2021-12-18 DIAGNOSIS — R0789 Other chest pain: Secondary | ICD-10-CM | POA: Diagnosis present

## 2021-12-18 DIAGNOSIS — R0602 Shortness of breath: Secondary | ICD-10-CM | POA: Insufficient documentation

## 2021-12-18 DIAGNOSIS — R42 Dizziness and giddiness: Secondary | ICD-10-CM | POA: Diagnosis not present

## 2021-12-18 LAB — CBC
HCT: 38.3 % (ref 36.0–46.0)
Hemoglobin: 12.1 g/dL (ref 12.0–15.0)
MCH: 24.2 pg — ABNORMAL LOW (ref 26.0–34.0)
MCHC: 31.6 g/dL (ref 30.0–36.0)
MCV: 76.4 fL — ABNORMAL LOW (ref 80.0–100.0)
Platelets: 216 10*3/uL (ref 150–400)
RBC: 5.01 MIL/uL (ref 3.87–5.11)
RDW: 17.6 % — ABNORMAL HIGH (ref 11.5–15.5)
WBC: 8.2 10*3/uL (ref 4.0–10.5)
nRBC: 0 % (ref 0.0–0.2)

## 2021-12-18 LAB — BASIC METABOLIC PANEL
Anion gap: 8 (ref 5–15)
BUN: 9 mg/dL (ref 6–20)
CO2: 25 mmol/L (ref 22–32)
Calcium: 9.6 mg/dL (ref 8.9–10.3)
Chloride: 107 mmol/L (ref 98–111)
Creatinine, Ser: 1.21 mg/dL — ABNORMAL HIGH (ref 0.44–1.00)
GFR, Estimated: 57 mL/min — ABNORMAL LOW (ref >60)
Glucose, Bld: 105 mg/dL — ABNORMAL HIGH (ref 70–99)
Potassium: 4 mmol/L (ref 3.5–5.1)
Sodium: 140 mmol/L (ref 135–145)

## 2021-12-18 LAB — TROPONIN I (HIGH SENSITIVITY)
Troponin I (High Sensitivity): <2 ng/L (ref <18)
Troponin I (High Sensitivity): <2 ng/L (ref <18)

## 2021-12-18 LAB — I-STAT BETA HCG BLOOD, ED (MC, WL, AP ONLY): I-stat hCG, quantitative: <5 m[IU]/mL (ref <5)

## 2021-12-18 MED ORDER — ACETAMINOPHEN 500 MG PO TABS
1000.0000 mg | ORAL_TABLET | Freq: Once | ORAL | Status: AC
  Administered 2021-12-18: 1000 mg via ORAL
  Filled 2021-12-18: qty 2

## 2021-12-18 NOTE — ED Triage Notes (Signed)
Patient reports getting iron infusion Thursday.Marland Kitchen She works in a cold area, and this morning she started to have central chest pain and numbness / tingling in her toes. Tearful. Feels like something is sitting on her chest.

## 2021-12-18 NOTE — ED Provider Notes (Signed)
[place] DEPT Provider Note  CSN: [id number] Arrival date & time: 12/18/21 5427  Chief Complaint(s) Chest Pain  HPI Gabriella Snyder is a 46 y.o. female with history of anemia presenting to the emergency department with chest pain.  Patient reports that she was at work today, developed onset of chest pain while working in a cold room (38 F).  Patient reports pain is centrally located.  Reports mild shortness of breath.  No nausea, vomiting.  No diaphoresis.  No syncope.  Reports mild lightheadedness.  Pain is not pleuritic or exertional.  Reports feels similar to when she came to the emergency department in April and was diagnosed with anemia.  No headaches, numbness, tingling.  She recently received iron transfusion for her anemia.  Denies recent travel, surgery, cough, hemoptysis, history of blood clots, hormone use.   Past Medical History Past Medical History:  Diagnosis Date   Anemia    Anxiety    Chronic pain    Degenerative disc disease, cervical    Depression    Heart murmur    "when I was younger; it closed up"   History of blood transfusion 1993   "related to the C-section   Migraine    "a couple times/yr" (02/25/2014)   Muscle spasm    Pyelonephritis 02/25/2014   Patient Active Problem List   Diagnosis Date Noted   Trichomonal vaginitis 12/26/2017   Morbid obesity ([place]) 02/17/2017   DUB (dysfunctional uterine bleeding) 02/17/2017   Yeast vaginitis 02/17/2017   BV (bacterial vaginosis) 02/27/2014   Iron deficiency anemia 02/27/2014   Pyelonephritis 02/25/2014   Acute pyelonephritis 02/25/2014   Home Medication(s) Prior to Admission medications   Medication Sig Start Date End Date Taking? Authorizing Provider  ascorbic acid (VITAMIN C) 500 MG tablet Take 500 mg by mouth daily.    [provider]  ergocalciferol (VITAMIN D2) 1.25 MG (50000 UT) capsule Take 50,000 Units by mouth once a week.    [provider]   ibuprofen (ADVIL) 800 MG tablet Take 800 mg by mouth every 8 (eight) hours as needed for mild pain or moderate pain.    [provider]  Multiple Vitamin (MULTIVITAMIN) tablet Take 1 tablet by mouth daily.    [provider]  oxycodone-acetaminophen (LYNOX) 10-300 MG tablet Take 1 tablet by mouth every 4 (four) hours as needed for pain.    [provider]  Prenatal Multivit-Min-Fe-FA (PRE-NATAL PO) Take by mouth daily.    [provider]                                                                                                                                    Past Surgical History Past Surgical History:  Procedure Laterality Date   CESAREAN SECTION  1993   DILATION AND CURETTAGE OF UTERUS  2003   OPEN REDUCTION INTERNAL FIXATION (ORIF) METACARPAL Right 12/12/2019   Procedure: OPEN  REDUCTION INTERNAL FIXATION (ORIF) MIDDLE AND DISTAL PHALANX FRACTURE;  Surgeon: Leanora Cover, MD;  Location: [place];  Service: Orthopedics;  Laterality: Right;   TUBAL LIGATION  2011   TUMOR EXCISION Right ~ 2004   "off my back"   Family History Family History  Problem Relation Age of Onset   Diabetes Mother    Hypertension Father    Cancer Maternal Grandfather    Stroke Paternal Grandfather    Cancer Maternal Aunt        cervical   Cancer Cousin        lymphoma    Social History Social History   Tobacco Use   Smoking status: Every Day    Packs/day: 0.25    Years: 6.00    Total pack years: 1.50    Types: Cigarettes   Smokeless tobacco: Never  Vaping Use   Vaping Use: Never used  Substance Use Topics   Alcohol use: Not Currently   Drug use: Yes    Types: Marijuana    Comment: 3 nights ago    Allergies Patient has no known allergies.  Review of Systems Review of Systems  All other systems reviewed and are negative.   Physical Exam Vital Signs  I have reviewed the triage vital signs BP 128/74   Pulse 62   Temp 97.8 F  (36.6 C) (Oral)   Resp 18   SpO2 97%  Physical Exam Vitals and nursing note reviewed.  Constitutional:      General: She is not in acute distress.    Appearance: She is well-developed.  HENT:     Head: Normocephalic and atraumatic.     Mouth/Throat:     Mouth: Mucous membranes are moist.  Eyes:     Pupils: Pupils are equal, round, and reactive to light.  Cardiovascular:     Rate and Rhythm: Normal rate and regular rhythm.     Heart sounds: No murmur heard.    Comments: Chest pain reproducible on palpation at the upper sternum Pulmonary:     Effort: Pulmonary effort is normal. No respiratory distress.     Breath sounds: Normal breath sounds.  Abdominal:     General: Abdomen is flat.     Palpations: Abdomen is soft.     Tenderness: There is no abdominal tenderness.  Musculoskeletal:        General: No tenderness.     Right lower leg: No edema.     Left lower leg: No edema.  Skin:    General: Skin is warm and dry.  Neurological:     General: No focal deficit present.     Mental Status: She is alert. Mental status is at baseline.  Psychiatric:        Mood and Affect: Mood normal.        Behavior: Behavior normal.     ED Results and Treatments Labs (all labs ordered are listed, but only abnormal results are displayed) Labs Reviewed  BASIC METABOLIC PANEL - Abnormal; Notable for the following components:      Result Value   Glucose, Bld 105 (*)    Creatinine, Ser 1.21 (*)    GFR, Estimated 57 (*)    All other components within normal limits  CBC - Abnormal; Notable for the following components:   MCV 76.4 (*)    MCH 24.2 (*)    RDW 17.6 (*)    All other components within normal limits  I-STAT BETA HCG BLOOD, ED ([facility], [facility],  [facility] ONLY)  TROPONIN I (HIGH SENSITIVITY)  TROPONIN I (HIGH SENSITIVITY)                                                                                                                          Radiology DG Chest 2 View  Result Date:  12/18/2021 CLINICAL DATA:  46 year old female with chest pain onset this morning. EXAM: CHEST - 2 VIEW COMPARISON:  06/17/2020 and earlier. FINDINGS: PA and lateral views at 0728 hours. Lung volumes and mediastinal contours are stable and within normal limits. Visualized tracheal air column is within normal limits. Both lungs appear clear. No pneumothorax or pleural effusion. Visible bowel-gas pattern within normal limits. No osseous abnormality identified. IMPRESSION: Negative.  No cardiopulmonary abnormality. Electronically Signed   By: Genevie Ann M.D.   On: 12/18/2021 07:36    Pertinent labs & imaging results that were available during my care of the patient were reviewed by me and considered in my medical decision making (see MDM for details).  Medications Ordered in ED Medications  acetaminophen (TYLENOL) tablet 1,000 mg (1,000 mg Oral Given 12/18/21 0842)                                                                                                                                     Procedures .1-3 Lead EKG Interpretation  Performed by: Cristie Hem, MD Authorized by: Cristie Hem, MD     Interpretation: normal     ECG rate assessment: normal     Rhythm: sinus rhythm     Ectopy: none     Conduction: normal     (including critical care time)  Medical Decision Making / ED Course   MDM:  46 year old female presenting to the emergency department with chest pain.  Well-appearing, EKG without acute ST or T wave changes concerning for ischemia.  Chest pain is reproducible on palpation.  Vital signs reassuring.  Suspect musculoskeletal chest pain or noncardiac cause.  Exam reassuring.  EKG reassuring.  Symptoms atypical for ACS.  Will check troponin.  Low concern for PE, patient PERC negative.  Doubt pneumonia, pneumothorax but will check chest x-ray.  Doubt esophageal pathology with no nausea or vomiting.  Doubt aortic pathology given age, mild symptoms, reproducible pain.   Will reassess.  Clinical Course as of 12/18/21 1051  Sat Dec 18, 2021  1050 Delta troponin negative. CXR negative. Suspect MSK  chest pain. Will discharge patient to home. All questions answered. Patient comfortable with plan of discharge. Return precautions discussed with patient and specified on the after visit summary.  [WS]    Clinical Course User Index [WS] Cristie Hem, MD     Additional history obtained: -Additional history obtained from medical records, partner -External records from outside source obtained and reviewed including: Chart review including previous notes, labs, imaging, consultation notes   Lab Tests: -I ordered, reviewed, and interpreted labs.   The pertinent results include:   Labs Reviewed  BASIC METABOLIC PANEL - Abnormal; Notable for the following components:      Result Value   Glucose, Bld 105 (*)    Creatinine, Ser 1.21 (*)    GFR, Estimated 57 (*)    All other components within normal limits  CBC - Abnormal; Notable for the following components:   MCV 76.4 (*)    MCH 24.2 (*)    RDW 17.6 (*)    All other components within normal limits  I-STAT BETA HCG BLOOD, ED ([facility], [facility], [facility] ONLY)  TROPONIN I (HIGH SENSITIVITY)  TROPONIN I (HIGH SENSITIVITY)      EKG   EKG Interpretation  Date/Time:  Saturday December 18 2021 07:21:37 EDT Ventricular Rate:  66 PR Interval:  158 QRS Duration: 105 QT Interval:  419 QTC Calculation: 439 R Axis:   55 Text Interpretation: Sinus rhythm Confirmed by Garnette Gunner [phone]) on 12/18/2021 7:45:54 AM         Imaging Studies ordered: I ordered imaging studies including chest x-ray I independently visualized and interpreted imaging. I agree with the radiologist interpretation   Medicines ordered and prescription drug management: Meds ordered this encounter  Medications   acetaminophen (TYLENOL) tablet 1,000 mg    -I have reviewed the patients home medicines and have made adjustments as  needed   Cardiac Monitoring: The patient was maintained on a cardiac monitor.  I personally viewed and interpreted the cardiac monitored which showed an underlying rhythm of: Sinus rhythm  Social Determinants of Health:  Factors impacting patients care include: Environmental conditions at work   Reevaluation: After the interventions noted above, I reevaluated the patient and found that they have :improved  Co morbidities that complicate the patient evaluation  Past Medical History:  Diagnosis Date   Anemia    Anxiety    Chronic pain    Degenerative disc disease, cervical    Depression    Heart murmur    "when I was younger; it closed up"   History of blood transfusion 1993   "related to the C-section   Migraine    "a couple times/yr" (02/25/2014)   Muscle spasm    Pyelonephritis 02/25/2014      Dispostion: I considered admission for this patient, for serial lab testing and further examination but given negative troponin, reassuring exam including reproducible chest pain, will discharge with close outpatient follow-up with primary physician.     Final Clinical Impression(s) / ED Diagnoses Final diagnoses:  Atypical chest pain     This chart was dictated using voice recognition software.  Despite best efforts to proofread,  errors can occur which can change the documentation meaning.    Cristie Hem, MD 12/18/21 1051

## 2021-12-18 NOTE — Discharge Instructions (Addendum)
We evaluated you today in the emergency department for your chest pain.  Your cardiac enzymes were negative and your exam was reassuring.  We did not see any sign of a heart attack.  Your x-ray was also reassuring without any dangerous findings.  Your hemoglobin today was reassuring at 12 which is very improved.  Please follow-up closely with your primary doctor.  Please return to the emergency department if you experience any new or worsening symptoms such as severe pain, fainting, vomiting, difficulty breathing, or any other concerning symptoms.  Please follow-up closely with your primary doctor.

## 2022-01-28 ENCOUNTER — Encounter: Payer: Self-pay | Admitting: Physician Assistant

## 2022-03-10 ENCOUNTER — Other Ambulatory Visit: Payer: Self-pay | Admitting: Physician Assistant

## 2022-03-10 DIAGNOSIS — D500 Iron deficiency anemia secondary to blood loss (chronic): Secondary | ICD-10-CM

## 2022-03-11 ENCOUNTER — Inpatient Hospital Stay: Payer: BC Managed Care – PPO

## 2022-03-11 ENCOUNTER — Encounter: Payer: Self-pay | Admitting: Physician Assistant

## 2022-03-11 ENCOUNTER — Inpatient Hospital Stay: Payer: BC Managed Care – PPO | Admitting: Physician Assistant

## 2022-03-17 ENCOUNTER — Encounter: Payer: Self-pay | Admitting: Physician Assistant

## 2022-03-18 ENCOUNTER — Inpatient Hospital Stay (HOSPITAL_BASED_OUTPATIENT_CLINIC_OR_DEPARTMENT_OTHER): Payer: BC Managed Care – PPO | Admitting: Physician Assistant

## 2022-03-18 ENCOUNTER — Inpatient Hospital Stay: Payer: BC Managed Care – PPO | Attending: Physician Assistant

## 2022-03-18 ENCOUNTER — Other Ambulatory Visit: Payer: Self-pay

## 2022-03-18 VITALS — BP 128/69 | HR 63 | Temp 98.1°F | Resp 15 | Wt 248.2 lb

## 2022-03-18 DIAGNOSIS — R0602 Shortness of breath: Secondary | ICD-10-CM | POA: Diagnosis not present

## 2022-03-18 DIAGNOSIS — Z807 Family history of other malignant neoplasms of lymphoid, hematopoietic and related tissues: Secondary | ICD-10-CM | POA: Insufficient documentation

## 2022-03-18 DIAGNOSIS — R42 Dizziness and giddiness: Secondary | ICD-10-CM | POA: Diagnosis not present

## 2022-03-18 DIAGNOSIS — D500 Iron deficiency anemia secondary to blood loss (chronic): Secondary | ICD-10-CM | POA: Insufficient documentation

## 2022-03-18 DIAGNOSIS — D563 Thalassemia minor: Secondary | ICD-10-CM | POA: Insufficient documentation

## 2022-03-18 DIAGNOSIS — Z8049 Family history of malignant neoplasm of other genital organs: Secondary | ICD-10-CM | POA: Diagnosis not present

## 2022-03-18 DIAGNOSIS — F17210 Nicotine dependence, cigarettes, uncomplicated: Secondary | ICD-10-CM | POA: Diagnosis not present

## 2022-03-18 DIAGNOSIS — Z79899 Other long term (current) drug therapy: Secondary | ICD-10-CM | POA: Diagnosis not present

## 2022-03-18 DIAGNOSIS — R5383 Other fatigue: Secondary | ICD-10-CM | POA: Insufficient documentation

## 2022-03-18 LAB — CBC WITH DIFFERENTIAL (CANCER CENTER ONLY)
Abs Immature Granulocytes: 0.02 10*3/uL (ref 0.00–0.07)
Basophils Absolute: 0 10*3/uL (ref 0.0–0.1)
Basophils Relative: 0 %
Eosinophils Absolute: 0.1 10*3/uL (ref 0.0–0.5)
Eosinophils Relative: 1 %
HCT: 39 % (ref 36.0–46.0)
Hemoglobin: 12.9 g/dL (ref 12.0–15.0)
Immature Granulocytes: 0 %
Lymphocytes Relative: 27 %
Lymphs Abs: 2.4 10*3/uL (ref 0.7–4.0)
MCH: 26 pg (ref 26.0–34.0)
MCHC: 33.1 g/dL (ref 30.0–36.0)
MCV: 78.6 fL — ABNORMAL LOW (ref 80.0–100.0)
Monocytes Absolute: 0.4 10*3/uL (ref 0.1–1.0)
Monocytes Relative: 4 %
Neutro Abs: 6 10*3/uL (ref 1.7–7.7)
Neutrophils Relative %: 68 %
Platelet Count: 224 10*3/uL (ref 150–400)
RBC: 4.96 MIL/uL (ref 3.87–5.11)
RDW: 14.5 % (ref 11.5–15.5)
WBC Count: 9 10*3/uL (ref 4.0–10.5)
nRBC: 0 % (ref 0.0–0.2)

## 2022-03-18 LAB — FERRITIN: Ferritin: 55 ng/mL (ref 11–307)

## 2022-03-18 LAB — TSH: TSH: 0.552 u[IU]/mL (ref 0.350–4.500)

## 2022-03-18 LAB — IRON AND IRON BINDING CAPACITY (CC-WL,HP ONLY)
Iron: 71 ug/dL (ref 28–170)
Saturation Ratios: 25 % (ref 10.4–31.8)
TIBC: 286 ug/dL (ref 250–450)
UIBC: 215 ug/dL (ref 148–442)

## 2022-03-21 ENCOUNTER — Telehealth: Payer: Self-pay

## 2022-03-21 ENCOUNTER — Telehealth: Payer: Self-pay | Admitting: Physician Assistant

## 2022-03-21 NOTE — Telephone Encounter (Signed)
Pt advised of normal lab results and to f/up with her PCP regarding her fatigue and FMLA.  Pt verbalized understanding

## 2022-03-21 NOTE — Progress Notes (Addendum)
[place] [phone]   Fax:([phone] [place] NOTE  Patient Care Team: Carylon Perches, NP as PCP - General (Family Medicine)  Hematological/Oncological History Labs from PCP Dr. Forbes Cellar at [facility]: -Hgb electrophoresis showed beta thalassemia minor.  -Ferritin 9  2.  12/03/2021: Establish care with [facility]ematology  3.  12/09/2021-12/16/2021: Received IV Feraheme 510 mg x 2 doses  CHIEF COMPLAINTS/PURPOSE OF CONSULTATION:  Microcytic anemia, iron deficiency, beta-thalassemia minor.   HISTORY OF PRESENTING ILLNESS:  Gabriella Snyder 46 y.o. female presents to hematology clinic for iron deficiency anemia. She was last seen on 12/03/2021 to establish care.  In the interim, she received IV Feraheme x2 doses.  Exam today, Ms. Gabriella Snyder reports that she still continues with minimal improvement after receiving IV iron.  She reports that she is easily exhausted and this does interfere with her daily activities.  Patient reports some dizziness but denies any syncopal episodes.  She does have shortness of breath with exertion but none at rest patient denies any nausea, vomiting or abdominal pain.  Her bowel habits are unchanged without any recurrent episodes of diarrhea or constipation.  She denies easy bruising or signs of active bleeding.  Continues to have heavy menstrual cycles. She denies fevers, chills, night sweats, chest pain or cough. She has no other complaints. Rest of the 10 point ROS is below.    MEDICAL HISTORY:  Past Medical History:  Diagnosis Date   Anemia    Anxiety    Chronic pain    Degenerative disc disease, cervical    Depression    Heart murmur    "when I was younger; it closed up"   History of blood transfusion 1993   "related to the C-section   Migraine    "a couple times/yr" (02/25/2014)   Muscle spasm    Pyelonephritis 02/25/2014    SURGICAL HISTORY: Past Surgical History:  Procedure Laterality Date    [place] OF UTERUS  2003   OPEN REDUCTION INTERNAL FIXATION (ORIF) METACARPAL Right 12/12/2019   Procedure: OPEN REDUCTION INTERNAL FIXATION (ORIF) MIDDLE AND DISTAL PHALANX FRACTURE;  Surgeon: Leanora Cover, MD;  Location: [place];  Service: Orthopedics;  Laterality: Right;   TUBAL LIGATION  2011   TUMOR EXCISION Right ~ 2004   "off my back"    SOCIAL HISTORY: Social History   Socioeconomic History   Marital status: Single    Spouse name: Not on file   Number of children: Not on file   Years of education: Not on file   Highest education level: Not on file  Occupational History   Not on file  Tobacco Use   Smoking status: Every Day    Packs/day: 0.25    Years: 6.00    Total pack years: 1.50    Types: Cigarettes   Smokeless tobacco: Never  Vaping Use   Vaping Use: Never used  Substance and Sexual Activity   Alcohol use: Not Currently   Drug use: Yes    Types: Marijuana    Comment: 3 nights ago    Sexual activity: Yes    Birth control/protection: Pill  Other Topics Concern   Not on file  Social History Narrative   Not on file   Social Determinants of Health   Financial Resource Strain: Not on file  Food Insecurity: Not on file  Transportation Needs: Not on file  Physical Activity: Not on file  Stress:  Not on file  Social Connections: Not on file  Intimate Partner Violence: Not on file    FAMILY HISTORY: Family History  Problem Relation Age of Onset   Diabetes Mother    Hypertension Father    Cancer Maternal Grandfather    Stroke Paternal Grandfather    Cancer Maternal Aunt        cervical   Cancer Cousin        lymphoma    ALLERGIES:  has No Known Allergies.  MEDICATIONS:  Current Outpatient Medications  Medication Sig Dispense Refill   ascorbic acid (VITAMIN C) 500 MG tablet Take 500 mg by mouth daily.     cyanocobalamin (VITAMIN B12) 1000 MCG tablet Take 1 tablet by mouth daily.      ergocalciferol (VITAMIN D2) 1.25 MG (50000 UT) capsule Take 50,000 Units by mouth once a week.     ibuprofen (ADVIL) 800 MG tablet Take 800 mg by mouth every 8 (eight) hours as needed for mild pain or moderate pain.     methocarbamol (ROBAXIN) 750 MG tablet Take 750 mg by mouth 3 (three) times daily.     Multiple Vitamin (MULTIVITAMIN) tablet Take 1 tablet by mouth daily.     oxyCODONE (ROXICODONE) 15 MG immediate release tablet Take 15 mg by mouth 4 (four) times daily as needed.     Prenatal Multivit-Min-Fe-FA (PRE-NATAL PO) Take by mouth daily.     oxycodone-acetaminophen (LYNOX) 10-300 MG tablet Take 1 tablet by mouth every 4 (four) hours as needed for pain. (Patient not taking: Reported on 03/18/2022)     No current facility-administered medications for this visit.    REVIEW OF SYSTEMS:   Constitutional: ( - ) fevers, ( - )  chills , ( - ) night sweats Eyes: ( - ) blurriness of vision, ( - ) double vision, ( - ) watery eyes Ears, nose, mouth, throat, and face: ( - ) mucositis, ( - ) sore throat Respiratory: ( - ) cough, ( + ) dyspnea, ( - ) wheezes Cardiovascular: ( - ) palpitation, ( - ) chest discomfort, ( - ) lower extremity swelling Gastrointestinal:  ( - ) nausea, ( - ) heartburn, ( - ) change in bowel habits Skin: ( - ) abnormal skin rashes Lymphatics: ( - ) new lymphadenopathy, ( - ) easy bruising Neurological: ( - ) numbness, ( - ) tingling, ( - ) new weaknesses Behavioral/Psych: ( - ) mood change, ( - ) new changes  All other systems were reviewed with the patient and are negative.  PHYSICAL EXAMINATION: ECOG PERFORMANCE STATUS: 1 - Symptomatic but completely ambulatory  Vitals:   03/18/22 1328  BP: 128/69  Pulse: 63  Resp: 15  Temp: 98.1 F (36.7 C)  SpO2: 100%   Filed Weights   03/18/22 1328  Weight: 248 lb 3.2 oz (112.6 kg)    GENERAL: well appearing female in NAD  SKIN: skin color, texture, turgor are normal, no rashes or significant lesions EYES:  conjunctiva are pink and non-injected, sclera clear LUNGS: clear to auscultation and percussion with normal breathing effort HEART: regular rate & rhythm and no murmurs and no lower extremity edema Musculoskeletal: no cyanosis of digits and no clubbing  PSYCH: alert & oriented x 3, fluent speech NEURO: no focal motor/sensory deficits  LABORATORY DATA:  I have reviewed the data as listed    Latest Ref Rng & Units 03/18/2022   12:48 PM 12/18/2021    7:22 AM 12/03/2021    4:17 PM  CBC  WBC 4.0 - 10.5 K/uL 9.0  8.2  9.9   Hemoglobin 12.0 - 15.0 g/dL 12.9  12.1  12.1   Hematocrit 36.0 - 46.0 % 39.0  38.3  38.1   Platelets 150 - 400 K/uL 224  216  230        Latest Ref Rng & Units 12/18/2021    7:22 AM 12/03/2021    4:17 PM 08/25/2021    1:16 PM  CMP  Glucose 70 - 99 mg/dL 105  95  99   BUN 6 - 20 mg/dL '9  9  10   '$ Creatinine 0.44 - 1.00 mg/dL 1.21  1.05  1.01   Sodium 135 - 145 mmol/L 140  139  136   Potassium 3.5 - 5.1 mmol/L 4.0  4.5  3.9   Chloride 98 - 111 mmol/L 107  108  104   CO2 22 - 32 mmol/L '25  26  23   '$ Calcium 8.9 - 10.3 mg/dL 9.6  9.4  9.0   Total Protein 6.5 - 8.1 g/dL  8.0  7.5   Total Bilirubin 0.3 - 1.2 mg/dL  0.6  0.7   Alkaline Phos 38 - 126 U/L  53  53   AST 15 - 41 U/L  14  21   ALT 0 - 44 U/L  7  9      ASSESSMENT & PLAN Gabriella Snyder is a 46 y.o.female who returns for a follow up for iron deficiency anemia.   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  --Received IV Feraheme 510 mg x 2 doses on 12/09/2021 and 8/102/2023 --Labs from today show no evidence of anemia with Hgb 12.9. Iron panel shows improvement with ferritin 55, iron 71, saturation 25%.  --No need for additional IV  iron at this time. If patient needs future IV iron, we will premedicate as patient developed itchiness over chest with previous IV iron.  --RTC in 3 months with repeat labs.   #Fatigue/dizziness/SOB: --Checked TSH levels, normal.  --Recommend to follow up with PCP for further evaluation.   Orders Placed This Encounter  Procedures   TSH    Standing Status:   Future    Number of Occurrences:   1    Standing Expiration Date:   03/18/2023    All questions were answered. The patient knows to call the clinic with any problems, questions or concerns.  I have spent a total of 25 minutes minutes of face-to-face and non-face-to-face time, preparing to see the patient,  performing a medically appropriate examination, counseling and educating the patient, documenting clinical information in the electronic health record,and care coordination.   Dede Query, PA-C Department of Hematology/Oncology [place] at [facility] Phone: [phone]

## 2022-03-21 NOTE — Telephone Encounter (Signed)
Per 11/10 los called and spoke to pt about appointments

## 2022-03-21 NOTE — Telephone Encounter (Signed)
-----   Message from Lincoln Brigham, PA-C sent at 03/21/2022 10:46 AM EST ----- Please notify patient that iron deficiency has improved to normal range. No need for additional IV iron at this time. We will see patient back in 3 months with repeat labs.

## 2022-04-05 ENCOUNTER — Ambulatory Visit (INDEPENDENT_AMBULATORY_CARE_PROVIDER_SITE_OTHER): Payer: BC Managed Care – PPO | Admitting: Family Medicine

## 2022-04-05 ENCOUNTER — Encounter: Payer: Self-pay | Admitting: Family Medicine

## 2022-04-05 VITALS — BP 116/70 | HR 59 | Temp 97.9°F | Wt 248.8 lb

## 2022-04-05 DIAGNOSIS — N921 Excessive and frequent menstruation with irregular cycle: Secondary | ICD-10-CM | POA: Diagnosis not present

## 2022-04-05 DIAGNOSIS — G8929 Other chronic pain: Secondary | ICD-10-CM

## 2022-04-05 DIAGNOSIS — E669 Obesity, unspecified: Secondary | ICD-10-CM

## 2022-04-05 DIAGNOSIS — M546 Pain in thoracic spine: Secondary | ICD-10-CM

## 2022-04-05 DIAGNOSIS — D561 Beta thalassemia: Secondary | ICD-10-CM

## 2022-04-05 DIAGNOSIS — R7303 Prediabetes: Secondary | ICD-10-CM

## 2022-04-05 LAB — POCT GLYCOSYLATED HEMOGLOBIN (HGB A1C): Hemoglobin A1C: 5.8 % — AB (ref 4.0–5.6)

## 2022-04-05 MED ORDER — SLYND 4 MG PO TABS: 1.0000 | ORAL_TABLET | Freq: Every day | ORAL | 1 refills | Status: DC

## 2022-04-05 MED ORDER — GABAPENTIN 300 MG PO CAPS: 300.0000 mg | ORAL_CAPSULE | Freq: Every day | ORAL | 1 refills | Status: DC

## 2022-04-05 NOTE — Patient Instructions (Addendum)
Today's plan: We will start a trial of drosperinone daily for excessive bleeding during menstrual cycle.  Please watch for any signs of blood clots, lower extremity swelling, redness, or sudden pain.  For upper back pain primarily on labs, will start trial of gabapentin 300 mg at bedtime only.  Will also send a referral to orthopedic specialist for further workup and evaluation.  Please continue to go to your pain management specialist for your medications.  Will review hemoglobin A1c as results become available.   Recommend 5-6 small meals throughout the day.  You will return in 1 month for weight management.  Also, increase your water intake to 64 ounces per day.  Will not start any medications for prediabetes on today.  Your hemoglobin A1c is 5.8, which is consistent with prediabetes.  That number has improved over the past 6 months.Ketogenic Eating Plan, Adult A ketogenic eating plan is a diet that is very low in carbohydrates, moderately low in protein, and very high in fat. Your body normally gets energy from eating carbohydrates. If you limit carbohydrates in your diet, your body will start to use stored fat as an energy source. When fat is broken down for energy, it enters your blood as a substance called ketones. A ketogenic eating plan relies mostly on ketones for energy. This eating plan can cause rapid weight loss because the body burns fat for fuel. What are the benefits of a ketogenic eating plan? Epilepsy Ketogenic eating plans have been well studied as a treatment for epilepsy in children and have been used for many years. These plans have been studied to treat epilepsy in adults too. You will want to work closely with a dietitian if your health care provider suggests this eating plan to manage your seizures. Other conditions Ketogenic eating plans have also been studied to help treat other conditions, including: Cancer. Type 2 diabetes. Alzheimer's disease. Parkinson's  disease. Autism. Multiple sclerosis. More long-term studies are needed to learn the effects of this eating plan on people with these and other conditions. What are the side effects of a ketogenic eating plan? Side effects of a ketogenic diet may include: Vitamin deficiencies. Dehydration. Bad breath. Nausea. Hunger. Constipation. Problems with menstrual periods. Inflammation of the pancreas. Kidney stones or gallbladder stones. Bone fractures. High cholesterol. What are tips for following this plan? Reading food labels Look for foods that have a low glycemic index (GI) label. Read a product's ingredient list to check for hidden or added sugar. Check food labels for the number of grams of carbohydrates and protein in each serving. This is important. Cooking Carefully measure or weigh foods. Make desserts using ketogenic or low GI recipes. Avoid cooking with sauces that have added sugar, such as barbecue sauce or ketchup. Meal planning There are several versions of ketogenic eating plans. The classic ketogenic diet recommends that 90% of your calories come from fat, 6% from protein, and 4% from carbohydrates. Aim for a daily meal and snack schedule that you can follow steadily. Every meal will include high-fat items, such as avocado, cream, butter, or mayonnaise. Each day, you should have: Fat: __________g. Protein: _________g. Carbohydrates: _________g. General information  Buy a gram scale to weigh foods. This is needed to follow this diet correctly. Your dietitian will teach you how to use it. Ask your health care provider what steps you can take to avoid side effects of this eating plan, such as constipation, dehydration, and kidney stones. Take vitamin and mineral supplements only as told  by your health care provider or dietitian. Work with your dietitian and health care provider to help you stay on track. What foods should I eat?  Fruits Fresh blueberries, blackberries,  or raspberries. Other fresh and frozen fruits in small amounts. Vegetables Lettuce. Bok choy. Eggplant. Tomatoes. Cucumbers. Peppers. Cauliflower. Zucchini. Fennel. Swiss chard. Grains Almond, hazelnut, or coconut flours. Meats and other proteins Meat, poultry, and fish. Bacon. Eggs. Egg substitutes. Nuts, nut butters (without added sugar), seeds, lentils, and split peas in small amounts. Dairy Cheese in moderate amounts. Beverages Plain or mineral water. Sugar-free, caffeine-free beverages. Club soda. Caffeine-free, carbohydrate-free herbal tea. Fats and oils Avocado. Cream. Sour cream. Cream cheese. Butter. Plant-based oils, such as olive, canola, and sunflower. Margarine. Mayonnaise. Sweets and desserts Any homemade sweets or desserts made using ketogenic diet recipes. The items listed above may not be a complete list of recommended foods and beverages. Contact a dietitian for more information. What foods should I avoid? Fruits Fruit juice. Fruits packed in syrups. Dried or candied fruits. Vegetables Corn. Potatoes (all kinds). Peas. Grains All bread, dry cereals, and cooked cereals with added sugar. Baked goods. Crackers and pretzels. Meats and other proteins Meat, poultry, or fish prepared with flour or breading. Nut butters with added sugar. Beans. Dairy Milk. Yogurt. Fats and oils Salad dressings with added sugar. Gravies. Beverages Sugar-sweetened teas, coffee drinks, or soft drinks. Juice. Sports drinks. Sweets and desserts All sweets and desserts, unless the dessert is homemade using ketogenic diet recipes. The items listed above may not be a complete list of foods and beverages to avoid. Contact a dietitian for more information. Summary A ketogenic eating plan is a diet that is very low in carbohydrates, moderately low in protein, and very high in fat. Aim for a daily meal and snack schedule that you can follow steadily. Buy a gram scale to weigh foods. This is needed  to follow this diet correctly. Your dietitian will teach you how to use it. Work closely with your health care provider and a dietitian while you are following a ketogenic eating plan. This information is not intended to replace advice given to you by your health care provider. Make sure you discuss any questions you have with your health care provider. Document Revised: 09/05/2019 Document Reviewed: 09/05/2019 Elsevier Patient Education  [place] Injection What is this medication? SEMAGLUTIDE (SEM a GLOO tide) treats type 2 diabetes. It works by increasing insulin levels in your body, which decreases your blood sugar (glucose). It also reduces the amount of sugar released into the blood and slows down your digestion. It can also be used to lower the risk of heart attack and stroke in people with type 2 diabetes. Changes to diet and exercise are often combined with this medication. This medicine may be used for other purposes; ask your health care provider or pharmacist if you have questions. COMMON BRAND NAME(S): OZEMPIC What should I tell my care team before I take this medication? They need to know if you have any of these conditions: Endocrine tumors (MEN 2) or if someone in your family had these tumors Eye disease, vision problems History of pancreatitis Kidney disease Stomach problems Thyroid cancer or if someone in your family had thyroid cancer An unusual or allergic reaction to semaglutide, other medications, foods, dyes, or preservatives Pregnant or trying to get pregnant Breast-feeding How should I use this medication? This medication is for injection under the skin of your upper leg (  thigh), stomach area, or upper arm. It is given once every week (every 7 days). You will be taught how to prepare and give this medication. Use exactly as directed. Take your medication at regular intervals. Do not take it more often than directed. If you use this  medication with insulin, you should inject this medication and the insulin separately. Do not mix them together. Do not give the injections right next to each other. Change (rotate) injection sites with each injection. It is important that you put your used needles and syringes in a special sharps container. Do not put them in a trash can. If you do not have a sharps container, call your pharmacist or care team to get one. A special MedGuide will be given to you by the pharmacist with each prescription and refill. Be sure to read this information carefully each time. This medication comes with INSTRUCTIONS FOR USE. Ask your pharmacist for directions on how to use this medication. Read the information carefully. Talk to your pharmacist or care team if you have questions. Talk to your care team about the use of this medication in children. Special care may be needed. Overdosage: If you think you have taken too much of this medicine contact a poison control center or emergency room at once. NOTE: This medicine is only for you. Do not share this medicine with others. What if I miss a dose? If you miss a dose, take it as soon as you can within 5 days after the missed dose. Then take your next dose at your regular weekly time. If it has been longer than 5 days after the missed dose, do not take the missed dose. Take the next dose at your regular time. Do not take double or extra doses. If you have questions about a missed dose, contact your care team for advice. What may interact with this medication? Other medications for diabetes Many medications may cause changes in blood sugar, these include: Alcohol containing beverages Antiviral medications for HIV or AIDS Aspirin and aspirin-like medications Certain medications for blood pressure, heart disease, irregular heart beat Chromium Diuretics Female hormones, such as estrogens or progestins, birth control  pills Fenofibrate Gemfibrozil Isoniazid Lanreotide Female hormones or anabolic steroids MAOIs like Carbex, Eldepryl, Marplan, Nardil, and Parnate Medications for weight loss Medications for allergies, asthma, cold, or cough Medications for depression, anxiety, or psychotic disturbances Niacin Nicotine NSAIDs, medications for pain and inflammation, like ibuprofen or naproxen Octreotide Pasireotide Pentamidine Phenytoin Probenecid Quinolone antibiotics such as ciprofloxacin, levofloxacin, ofloxacin Some herbal dietary supplements Steroid medications such as prednisone or cortisone Sulfamethoxazole; trimethoprim Thyroid hormones Some medications can hide the warning symptoms of low blood sugar (hypoglycemia). You may need to monitor your blood sugar more closely if you are taking one of these medications. These include: Beta-blockers, often used for high blood pressure or heart problems (examples include atenolol, metoprolol, propranolol) Clonidine Guanethidine Reserpine This list may not describe all possible interactions. Give your health care provider a list of all the medicines, herbs, non-prescription drugs, or dietary supplements you use. Also tell them if you smoke, drink alcohol, or use illegal drugs. Some items may interact with your medicine. What should I watch for while using this medication? Visit your care team for regular checks on your progress. Drink plenty of fluids while taking this medication. Check with your care team if you get an attack of severe diarrhea, nausea, and vomiting. The loss of too much body fluid can make it dangerous for you  to take this medication. A test called the HbA1C (A1C) will be monitored. This is a simple blood test. It measures your blood sugar control over the last 2 to 3 months. You will receive this test every 3 to 6 months. Learn how to check your blood sugar. Learn the symptoms of low and high blood sugar and how to manage them. Always  carry a quick-source of sugar with you in case you have symptoms of low blood sugar. Examples include hard sugar candy or glucose tablets. Make sure others know that you can choke if you eat or drink when you develop serious symptoms of low blood sugar, such as seizures or unconsciousness. They must get medical help at once. Tell your care team if you have high blood sugar. You might need to change the dose of your medication. If you are sick or exercising more than usual, you might need to change the dose of your medication. Do not skip meals. Ask your care team if you should avoid alcohol. Many nonprescription cough and cold products contain sugar or alcohol. These can affect blood sugar. Pens should never be shared. Even if the needle is changed, sharing may result in passing of viruses like hepatitis or HIV. Wear a medical ID bracelet or chain, and carry a card that describes your disease and details of your medication and dosage times. Do not become pregnant while taking this medication. Women should inform their care team if they wish to become pregnant or think they might be pregnant. There is a potential for serious side effects to an unborn child. Talk to your care team for more information. What side effects may I notice from receiving this medication? Side effects that you should report to your care team as soon as possible: Allergic reactions--skin rash, itching, hives, swelling of the face, lips, tongue, or throat Change in vision Dehydration--increased thirst, dry mouth, feeling faint or lightheaded, headache, dark yellow or brown urine Gallbladder problems--severe stomach pain, nausea, vomiting, fever Heart palpitations--rapid, pounding, or irregular heartbeat Kidney injury--decrease in the amount of urine, swelling of the ankles, hands, or feet Pancreatitis--severe stomach pain that spreads to your back or gets worse after eating or when touched, fever, nausea, vomiting Thyroid  cancer--new mass or lump in the neck, pain or trouble swallowing, trouble breathing, hoarseness Side effects that usually do not require medical attention (report to your care team if they continue or are bothersome): Diarrhea Loss of appetite Nausea Stomach pain Vomiting This list may not describe all possible side effects. Call your doctor for medical advice about side effects. You may report side effects to FDA at 1-800-FDA-1088. Where should I keep my medication? Keep out of the reach of children. Store unopened pens in a refrigerator between 2 and 8 degrees C (36 and 46 degrees F). Do not freeze. Protect from light and heat. After you first use the pen, it can be stored for 56 days at room temperature between 15 and 30 degrees C (59 and 86 degrees F) or in a refrigerator. Throw away your used pen after 56 days or after the expiration date, whichever comes first. Do not store your pen with the needle attached. If the needle is left on, medication may leak from the pen. NOTE: This sheet is a summary. It may not cover all possible information. If you have questions about this medicine, talk to your doctor, pharmacist, or health care provider.  2023 Elsevier/Gold Standard (2020-07-09 00:00:00)      Drospirenone Tablets  What is this medication? DROSPIRENONE (dro SPY re nown) prevents ovulation and pregnancy. It belongs to a group of medications called contraceptives. This medication is a progestin hormone. This medicine may be used for other purposes; ask your health care provider or pharmacist if you have questions. COMMON BRAND NAME(S): Slynd What should I tell my care team before I take this medication? They need to know if you have any of these conditions: Abnormal vaginal bleeding Adrenal gland disease Blood vessel disease or blood clots Breast, cervical, endometrial, ovarian, liver, or uterine cancer Depression Diabetes Heart disease or recent heart attack High potassium  level Kidney disease Liver disease Migraine headaches Stroke An unusual or allergic reaction to drospirenone, progestins, or other medications, foods, dyes, or preservatives Pregnant or trying to get pregnant Breast-feeding How should I use this medication? Take this medication by mouth. To reduce nausea, this medication may be taken with food. Follow the directions on the prescription label. Take this medication at the same time each day and in the order directed on the package. Do not take your medication more often than directed. A patient package insert for the product will be given with each prescription and refill. Read this sheet carefully each time. The sheet may change frequently. Talk to your care team about the use of this medication in children. Special care may be needed. Overdosage: If you think you have taken too much of this medicine contact a poison control center or emergency room at once. NOTE: This medicine is only for you. Do not share this medicine with others. What if I miss a dose? If you miss a dose, take it as soon as you can and refer to the patient information sheet you received with your medication for direction. If you miss more than one pill, this medication may not be as effective, and you may need to use another form of contraception. What may interact with this medication? Do not take this medication with any of the following: Atazanavir; cobicistat Bosentan Fosamprenavir This medication may also interact with the following: Aprepitant Barbiturates, such as phenobarbital, primidone Carbamazepine Certain antibiotics, such as clarithromycin, rifampin, rifabutin, rifapentine Certain antivirals for HIV or hepatitis Certain diuretics, such as amiloride, spironolactone, triamterene Certain medications for blood pressure, heart disease Certain medications for fungal infections, such as griseofulvin, ketoconazole, itraconazole,  voriconazole Cyclosporine Felbamate Heparin Medications for diabetes Modafinil NSAIDs, medications for pain and inflammation, such as ibuprofen or naproxen Oxcarbazepine Phenytoin Potassium supplements Rufinamide St. John's Wort Topiramate This list may not describe all possible interactions. Give your health care provider a list of all the medicines, herbs, non-prescription drugs, or dietary supplements you use. Also tell them if you smoke, drink alcohol, or use illegal drugs. Some items may interact with your medicine. What should I watch for while using this medication? Visit your care team for regular checks on your progress. You will need a regular breast and pelvic exam and Pap smear while on this medication. You may need blood work done while you are taking this medication. If you have any reason to think you are pregnant, stop taking this medication right away and contact your care team. This medication does not protect you against HIV or any other sexually transmitted infections (STIs). If you are going to have elective surgery, you may need to stop taking this medication before the surgery. Consult your care team for advice. What side effects may I notice from receiving this medication? Side effects that you should report to your care  team as soon as possible: Allergic reactions--skin rash, itching, hives, swelling of the face, lips, tongue, or throat Blood clot--pain, swelling, or warmth in the leg, shortness of breath, chest pain Heart attack--pain or tightness in the chest, shoulders, arms, or jaw, nausea, shortness of breath, cold or clammy skin, feeling faint or lightheaded High potassium level--muscle weakness, fast or irregular heartbeat Liver injury--right upper belly pain, loss of appetite, nausea, light-colored stool, dark yellow or brown urine, yellowing skin or eyes, unusual weakness or fatigue Stroke--sudden numbness or weakness of the face, arm, or leg, trouble  speaking, confusion, trouble walking, loss of balance or coordination, dizziness, severe headache, change in vision Worsening mood, feelings of depression Side effects that usually do not require medical attention (report to your care team if they continue or are bothersome): Acne Breast pain or tenderness Headache Irregular menstrual cycles or spotting Menstrual cramps Nausea Weight gain This list may not describe all possible side effects. Call your doctor for medical advice about side effects. You may report side effects to FDA at 1-800-FDA-1088. Where should I keep my medication? Keep out of the reach of children and pets. Store at room temperature between 20 and 25 degrees C (68 and 77 degrees F). Throw away any unused medication after the expiration date. NOTE: This sheet is a summary. It may not cover all possible information. If you have questions about this medicine, talk to your doctor, pharmacist, or health care provider.  2023 Elsevier/Gold Standard (2021-02-12 00:00:00)

## 2022-04-05 NOTE — Progress Notes (Signed)
New Patient Office Visit  Subjective    Patient ID: Gabriella Snyder, female    DOB: Feb 03, 1976  Age: 46 y.o. MRN: [id number]  CC:  Chief Complaint  Patient presents with   Christmas is a very pleasant 46 year old female with a medical history significant for beta thalassemia, chronic pain syndrome, chronic back pain, prediabetes, and obesity presents to establish care.  Patient states that she was recently diagnosed with beta thalassemia by her hematology team.  Patient has also had some iron deficiency in the past and has received several iron transfusions.  Patient has a very long history of chronic back pain.  Pain is primarily thoracic, left-sided back pain.  Patient characterizes pain as burning, "needlesticks".  She states that she has pain every day that has been mostly unrelieved by her medication regimen.  She states that her pain intensity is 4/10 now.  Patient last had oxycodone 15 mg this a.m. with moderate relief.  She also takes Robaxin infrequently for muscle spasms.  Patient is followed by [facility] pain clinic for pain medication management.  She is typically seen monthly.  Patient typically misses several days per month due to uncontrolled back pain.  Patient has a history of prediabetes.  Her most recent hemoglobin A1c was 6.0.  Patient was not started on medications at that time.  She endorses increased weight gain over the past year.  Patient reports being uncomfortable and unable to fit into her clothing.  In November 2020, patient was around 210 Snyder and today her weight is 248 Snyder.  Patient does not exercise due to chronic pain.  She does not follow a low-fat, low carbohydrate diet divided over small meals.  Patient mostly eats a high-fat high carbohydrate diet.      Outpatient Encounter Medications as of 04/05/2022  Medication Sig   ascorbic acid (VITAMIN C) 500 MG tablet Take 500 mg by mouth daily.   cyanocobalamin (VITAMIN B12) 1000  MCG tablet Take 1 tablet by mouth daily.   Drospirenone (SLYND) 4 MG TABS Take 1 tablet by mouth daily at 12 noon.   ergocalciferol (VITAMIN D2) 1.25 MG (50000 UT) capsule Take 50,000 Units by mouth once a week.   gabapentin (NEURONTIN) 300 MG capsule Take 1 capsule (300 mg total) by mouth at bedtime.   ibuprofen (ADVIL) 800 MG tablet Take 800 mg by mouth every 8 (eight) hours as needed for mild pain or moderate pain.   methocarbamol (ROBAXIN) 750 MG tablet Take 750 mg by mouth 3 (three) times daily.   oxyCODONE (ROXICODONE) 15 MG immediate release tablet Take 15 mg by mouth 4 (four) times daily as needed.   Multiple Vitamin (MULTIVITAMIN) tablet Take 1 tablet by mouth daily. (Patient not taking: Reported on 04/05/2022)   Prenatal Multivit-Min-Fe-FA (PRE-NATAL PO) Take by mouth daily. (Patient not taking: Reported on 04/05/2022)   No facility-administered encounter medications on file as of 04/05/2022.    Past Medical History:  Diagnosis Date   Anemia    Anxiety    Chronic pain    Degenerative disc disease, cervical    Depression    Heart murmur    "when I was younger; it closed up"   History of blood transfusion 1993   "related to the C-section   Migraine    "a couple times/yr" (02/25/2014)   Muscle spasm    Pyelonephritis 02/25/2014    Past Surgical History:  Procedure Laterality Date   [place]  DILATION AND CURETTAGE OF UTERUS  2003   OPEN REDUCTION INTERNAL FIXATION (ORIF) METACARPAL Right 12/12/2019   Procedure: OPEN REDUCTION INTERNAL FIXATION (ORIF) MIDDLE AND DISTAL PHALANX FRACTURE;  Surgeon: Leanora Cover, MD;  Location: [place];  Service: Orthopedics;  Laterality: Right;   TUBAL LIGATION  2011   TUMOR EXCISION Right ~ 2004   "off my back"    Family History  Problem Relation Age of Onset   Diabetes Mother    Hypertension Father    Cancer Maternal Grandfather    Stroke Paternal Grandfather    Cancer Maternal Aunt        cervical    Cancer Cousin        lymphoma    Social History   Socioeconomic History   Marital status: Single    Spouse name: Not on file   Number of children: Not on file   Years of education: Not on file   Highest education level: Not on file  Occupational History   Not on file  Tobacco Use   Smoking status: Every Day    Packs/day: 0.25    Years: 6.00    Total pack years: 1.50    Types: Cigarettes   Smokeless tobacco: Never  Vaping Use   Vaping Use: Never used  Substance and Sexual Activity   Alcohol use: Not Currently   Drug use: Yes    Types: Marijuana    Comment: 3 nights ago    Sexual activity: Yes    Birth control/protection: Pill  Other Topics Concern   Not on file  Social History Narrative   Not on file   Social Determinants of Health   Financial Resource Strain: Not on file  Food Insecurity: Not on file  Transportation Needs: Not on file  Physical Activity: Not on file  Stress: Not on file  Social Connections: Not on file  Intimate Partner Violence: Not on file    ROS      Objective    BP 116/70   Pulse (!) 59   Temp 97.9 F (36.6 C)   Wt 248 lb 12.8 oz (112.9 kg)   SpO2 100%   BMI 35.19 kg/m  Filed Weights   04/05/22 1108  Weight: 248 lb 12.8 oz (112.9 kg)    Physical Exam Constitutional:      Appearance: Normal appearance.  Eyes:     Pupils: Pupils are equal, round, and reactive to light.  Cardiovascular:     Rate and Rhythm: Normal rate and regular rhythm.     Pulses: Normal pulses.  Pulmonary:     Effort: Pulmonary effort is normal.  Abdominal:     General: Bowel sounds are normal.  Musculoskeletal:        General: Normal range of motion.  Skin:    General: Skin is warm.  Neurological:     General: No focal deficit present.     Mental Status: She is alert. Mental status is at baseline.  Psychiatric:        Mood and Affect: Mood normal.        Behavior: Behavior normal.        Thought Content: Thought content normal.         Judgment: Judgment normal.       Assessment & Plan:   Problem List Items Addressed This Visit   None Visit Diagnoses     Beta thalassemia (Gabriella Snyder)    -  Primary   Chronic left-sided thoracic back pain  Relevant Medications   gabapentin (NEURONTIN) 300 MG capsule   Other Relevant Orders   AMB referral to orthopedics   Prediabetes       Relevant Orders   HgB A1c (Completed)   Menorrhagia with irregular cycle       Relevant Medications   Drospirenone (SLYND) 4 MG TABS      1. Beta thalassemia (HCC) Gabriella Snyder will continue to follow-up with her hematology team for beta thalassemia.  2. Chronic left-sided thoracic back pain Patient is under the care of pain management for chronic back pain.  She is actually not under the care of an orthopedic specialist.  Patient may benefit from further workup and evaluation from an orthopedic specialist. It appears that patient's most recent MRI was in 2021.  The MRI was of the thoracic spine which showed mild thoracic spondylosis and degenerative disc disease, without observed impingement.  She had several small thoracic hemangiomas that were incidentally noted, 1 was 0.6 cm along the upper portion of the T9 vertebral body.  Also, Modic type II degenerative endplate findings at I4-5 and along the superior endplate of T6.   - AMB referral to orthopedics - gabapentin (NEURONTIN) 300 MG capsule; Take 1 capsule (300 mg total) by mouth at bedtime.  Dispense: 90 capsule; Refill: 1  3. Prediabetes Today, hemoglobin A1c has improved from previous.  Recommend a ketogenic diet.  Low carbohydrate diet divided over small meals throughout the day. Also, recommend low impact cardiovascular exercise that include a brisk walk.  No strenuous exercises at this time due to chronic back pain. - HgB A1c  4. Menorrhagia with irregular cycle Patient reports heavy menstrual cycles around every 14 days.  Will start a trial of drospirenone to assist with this problem.   Will reassess in 1 month. - Drospirenone (SLYND) 4 MG TABS; Take 1 tablet by mouth daily at 12 noon.  Dispense: 90 tablet; Refill: 1  5. Obesity (BMI 35.0-39.9 without comorbidity) The patient is asked to make an attempt to improve diet and exercise patterns to aid in medical management of this problem.   Gabriella Snyder will follow-up in clinic in 1 month for weight management.  Patient may benefit from a GLP-1 for further weight management.  She is gaining weight rapidly over the past year.  Also, she will follow-up in 1 month after starting drospirenone for menorrhagia.   Gabriella Pounds  APRN, MSN, FNP-C Patient [place] [address] [place], [place] [address] [phone]

## 2022-04-07 ENCOUNTER — Ambulatory Visit: Payer: BC Managed Care – PPO | Admitting: Surgery

## 2022-05-10 ENCOUNTER — Ambulatory Visit (INDEPENDENT_AMBULATORY_CARE_PROVIDER_SITE_OTHER): Payer: BC Managed Care – PPO | Admitting: Family Medicine

## 2022-05-10 ENCOUNTER — Encounter: Payer: Self-pay | Admitting: Family Medicine

## 2022-05-10 VITALS — BP 114/72 | HR 74 | Temp 97.6°F | Ht 70.5 in | Wt 244.0 lb

## 2022-05-10 DIAGNOSIS — G8929 Other chronic pain: Secondary | ICD-10-CM

## 2022-05-10 DIAGNOSIS — N921 Excessive and frequent menstruation with irregular cycle: Secondary | ICD-10-CM

## 2022-05-10 DIAGNOSIS — R7303 Prediabetes: Secondary | ICD-10-CM

## 2022-05-10 DIAGNOSIS — M546 Pain in thoracic spine: Secondary | ICD-10-CM

## 2022-05-10 DIAGNOSIS — E669 Obesity, unspecified: Secondary | ICD-10-CM

## 2022-05-10 NOTE — Progress Notes (Signed)
Established Patient Office Visit  Subjective   Patient ID: Gabriella Snyder, female    DOB: May 28, 1975  Age: 47 y.o. MRN: [id number]  Chief Complaint  Patient presents with   Back Pain    Has improved     Gabriella Snyder is a 47 year old female with a medical history significant for chronic back pain, menorrhagia, and iron deficiency anemia presents for follow-up.  1 month ago patient was evaluated in clinic for menorrhagia.  At that time she was started on drospirenone.  She states that menstrual cycles have improved in volume and severity.  Patient was also referred to gynecology at that time and has an appointment scheduled for 05/11/2022. Patient also has a history of chronic back pain.  She is followed by pain management.  Last month, patient was referred to orthopedic specialist but failed to establish care.  She was notified for an appointment, but did not answer. Patient continues to have pain primarily to left back.  Pain intensity is 5/10.  Patient last had oxycodone this a.m. with moderate relief.    Patient Active Problem List   Diagnosis Date Noted   Trichomonal vaginitis 12/26/2017   Morbid obesity ([place]) 02/17/2017   DUB (dysfunctional uterine bleeding) 02/17/2017   Yeast vaginitis 02/17/2017   BV (bacterial vaginosis) 02/27/2014   Iron deficiency anemia 02/27/2014   Pyelonephritis 02/25/2014   Acute pyelonephritis 02/25/2014   Past Medical History:  Diagnosis Date   Anemia    Anxiety    Chronic pain    Degenerative disc disease, cervical    Depression    Heart murmur    "when I was younger; it closed up"   History of blood transfusion 1993   "related to the C-section   Migraine    "a couple times/yr" (02/25/2014)   Muscle spasm    Pyelonephritis 02/25/2014   Past Surgical History:  Procedure Laterality Date   [place] OF UTERUS  2003   OPEN REDUCTION INTERNAL FIXATION (ORIF) METACARPAL Right 12/12/2019   Procedure:  OPEN REDUCTION INTERNAL FIXATION (ORIF) MIDDLE AND DISTAL PHALANX FRACTURE;  Surgeon: Leanora Cover, MD;  Location: [place];  Service: Orthopedics;  Laterality: Right;   TUBAL LIGATION  2011   TUMOR EXCISION Right ~ 2004   "off my back"   Social History   Tobacco Use   Smoking status: Every Day    Packs/day: 0.25    Years: 6.00    Total pack years: 1.50    Types: Cigarettes   Smokeless tobacco: Never  Vaping Use   Vaping Use: Never used  Substance Use Topics   Alcohol use: Not Currently   Drug use: Yes    Types: Marijuana    Comment: 3 nights ago    Social History   Socioeconomic History   Marital status: Single    Spouse name: Not on file   Number of children: Not on file   Years of education: Not on file   Highest education level: Not on file  Occupational History   Not on file  Tobacco Use   Smoking status: Every Day    Packs/day: 0.25    Years: 6.00    Total pack years: 1.50    Types: Cigarettes   Smokeless tobacco: Never  Vaping Use   Vaping Use: Never used  Substance and Sexual Activity   Alcohol use: Not Currently   Drug use: Yes    Types: Marijuana  Comment: 3 nights ago    Sexual activity: Yes    Birth control/protection: Pill  Other Topics Concern   Not on file  Social History Narrative   Not on file   Social Determinants of Health   Financial Resource Strain: Not on file  Food Insecurity: Not on file  Transportation Needs: Not on file  Physical Activity: Not on file  Stress: Not on file  Social Connections: Not on file  Intimate Partner Violence: Not on file   Family Status  Relation Name Status   Mother  Deceased   Father  Alive   MGF  (Not Specified)   PGF  (Not Specified)   Mat Aunt  (Not Specified)   Cousin  (Not Specified)   Family History  Problem Relation Age of Onset   Diabetes Mother    Hypertension Father    Cancer Maternal Grandfather    Stroke Paternal Grandfather    Cancer Maternal Aunt         cervical   Cancer Cousin        lymphoma   No Known Allergies    Review of Systems  Constitutional: Negative.   HENT: Negative.    Respiratory: Negative.    Gastrointestinal: Negative.   Genitourinary: Negative.   Musculoskeletal:  Positive for back pain and joint pain.  Skin: Negative.   Neurological: Negative.   Psychiatric/Behavioral: Negative.        Objective:     BP 114/72   Pulse 74   Temp 97.6 F (36.4 C)   Ht 5' 10.5" (1.791 m)   Wt 244 lb (110.7 kg)   LMP 05/02/2022   SpO2 100%   BMI 34.52 kg/m  BP Readings from Last 3 Encounters:  05/10/22 114/72  04/05/22 116/70  03/18/22 128/69   Wt Readings from Last 3 Encounters:  05/10/22 244 lb (110.7 kg)  04/05/22 248 lb 12.8 oz (112.9 kg)  03/18/22 248 lb 3.2 oz (112.6 kg)      Physical Exam Constitutional:      Appearance: Normal appearance.  Eyes:     Pupils: Pupils are equal, round, and reactive to light.  Pulmonary:     Effort: Pulmonary effort is normal.  Abdominal:     General: Bowel sounds are normal.  Musculoskeletal:        General: Normal range of motion.  Skin:    General: Skin is warm.  Neurological:     General: No focal deficit present.     Mental Status: She is alert. Mental status is at baseline.  Psychiatric:        Mood and Affect: Mood normal.        Behavior: Behavior normal.        Thought Content: Thought content normal.        Judgment: Judgment normal.      No results found for any visits on 05/10/22.  Last CBC Lab Results  Component Value Date   WBC 9.0 03/18/2022   HGB 12.9 03/18/2022   HCT 39.0 03/18/2022   MCV 78.6 (L) 03/18/2022   MCH 26.0 03/18/2022   RDW 14.5 03/18/2022   PLT 224 34/28/7681   Last metabolic panel Lab Results  Component Value Date   GLUCOSE 105 (H) 12/18/2021   NA 140 12/18/2021   K 4.0 12/18/2021   CL 107 12/18/2021   CO2 25 12/18/2021   BUN 9 12/18/2021   CREATININE 1.21 (H) 12/18/2021   GFRNONAA 57 (L) 12/18/2021   CALCIUM  9.6 12/18/2021   PROT 8.0 12/03/2021   ALBUMIN 4.2 12/03/2021   BILITOT 0.6 12/03/2021   ALKPHOS 53 12/03/2021   AST 14 (L) 12/03/2021   ALT 7 12/03/2021   ANIONGAP 8 12/18/2021   Last lipids No results found for: "CHOL", "HDL", "LDLCALC", "LDLDIRECT", "TRIG", "CHOLHDL" Last hemoglobin A1c Lab Results  Component Value Date   HGBA1C 5.8 (A) 04/05/2022   Last thyroid functions Lab Results  Component Value Date   TSH 0.552 03/18/2022   Last vitamin D No results found for: "25OHVITD2", "25OHVITD3", "VD25OH" Last vitamin B12 and Folate No results found for: "VITAMINB12", "FOLATE"    The ASCVD Risk score (Arnett DK, et al., 2019) failed to calculate for the following reasons:   Cannot find a previous HDL lab   Cannot find a previous total cholesterol lab    Assessment & Plan:   Problem List Items Addressed This Visit   None Visit Diagnoses     Prediabetes    -  Primary   Obesity (BMI 35.0-39.9 without comorbidity)       Menorrhagia with irregular cycle       Chronic left-sided thoracic back pain         1. Prediabetes Will recheck hemoglobin A1c at follow up appointment.  2. Obesity (BMI 35.0-39.9 without comorbidity) The patient is asked to make an attempt to improve diet and exercise patterns to aid in medical management of this problem.   3. Menorrhagia with irregular cycle Problem improved on drospirenone.   Return in about 6 months (around 11/08/2022) for prediabetes.   [name]  APRN, MSN, FNP-C Patient [place] [address] [place], [place] [address] [phone]

## 2022-05-11 ENCOUNTER — Encounter: Payer: Self-pay | Admitting: General Practice

## 2022-05-11 ENCOUNTER — Encounter: Payer: Self-pay | Admitting: Obstetrics & Gynecology

## 2022-05-11 ENCOUNTER — Ambulatory Visit (INDEPENDENT_AMBULATORY_CARE_PROVIDER_SITE_OTHER): Payer: BC Managed Care – PPO | Admitting: Obstetrics & Gynecology

## 2022-05-11 ENCOUNTER — Other Ambulatory Visit (HOSPITAL_COMMUNITY)
Admission: RE | Admit: 2022-05-11 | Discharge: 2022-05-11 | Disposition: A | Discharge status: Home / Self Care | Payer: BC Managed Care – PPO | Source: Ambulatory Visit | Attending: Obstetrics & Gynecology | Admitting: Obstetrics & Gynecology

## 2022-05-11 VITALS — BP 124/73 | HR 57 | Ht 70.5 in | Wt 247.0 lb

## 2022-05-11 DIAGNOSIS — Z808 Family history of malignant neoplasm of other organs or systems: Secondary | ICD-10-CM | POA: Diagnosis not present

## 2022-05-11 DIAGNOSIS — Z1211 Encounter for screening for malignant neoplasm of colon: Secondary | ICD-10-CM

## 2022-05-11 DIAGNOSIS — Z8049 Family history of malignant neoplasm of other genital organs: Secondary | ICD-10-CM | POA: Diagnosis not present

## 2022-05-11 DIAGNOSIS — N939 Abnormal uterine and vaginal bleeding, unspecified: Secondary | ICD-10-CM | POA: Insufficient documentation

## 2022-05-11 DIAGNOSIS — Z01419 Encounter for gynecological examination (general) (routine) without abnormal findings: Secondary | ICD-10-CM

## 2022-05-11 DIAGNOSIS — Z3202 Encounter for pregnancy test, result negative: Secondary | ICD-10-CM

## 2022-05-11 DIAGNOSIS — D259 Leiomyoma of uterus, unspecified: Secondary | ICD-10-CM | POA: Diagnosis not present

## 2022-05-11 DIAGNOSIS — Z1231 Encounter for screening mammogram for malignant neoplasm of breast: Secondary | ICD-10-CM

## 2022-05-11 LAB — POCT URINE PREGNANCY: Preg Test, Ur: NEGATIVE

## 2022-05-11 NOTE — Patient Instructions (Signed)
Endometrial Ablation Endometrial ablation is a procedure that destroys the thin inner layer of the lining of the uterus (endometrium). This procedure may be done: To stop heavy menstrual periods. To stop bleeding that is causing anemia. To control irregular bleeding. To treat bleeding caused by small tumors (fibroids) in the endometrium. This procedure is often done as an alternative to major surgery, such as removal of the uterus and cervix (hysterectomy). As a result of this procedure: You may not be able to have children. However, if you have not yet gone through menopause: You may still have a small chance of getting pregnant. You will need to use a reliable method of birth control after the procedure to prevent pregnancy. You may stop having a menstrual period, or you may have only a small amount of bleeding during your period. Menstruation may return several years after the procedure. Tell a health care provider about: Any allergies you have. All medicines you are taking, including vitamins, herbs, eye drops, creams, and over-the-counter medicines. Any problems you or family members have had with the use of anesthetic medicines. Any blood disorders you have. Any surgeries you have had. Any medical conditions you have. Whether you are pregnant or may be pregnant. What are the risks? Generally, this is a safe procedure. However, problems may occur, including: A hole (perforation) in the uterus or bowel. Infection in the uterus, bladder, or vagina. Bleeding. Allergic reaction to medicines. Damage to nearby structures or organs. An air bubble in the lung (air embolus). Problems with pregnancy. Failure of the procedure. Decreased ability to diagnose cancer in the endometrium. Scar tissue forms after the procedure, making it more difficult to get a sample of the uterine lining. What happens before the procedure? Medicines Ask your health care provider about: Changing or stopping your  regular medicines. This is especially important if you take diabetes medicines or blood thinners. Taking medicines such as aspirin and ibuprofen. These medicines can thin your blood. Do not take these medicines before your procedure if your doctor tells you not to take them. Taking over-the-counter medicines, vitamins, herbs, and supplements. Tests You will have tests of your endometrium to make sure there are no precancerous cells or cancer cells present. You may have an ultrasound of the uterus. General instructions Do not use any products that contain nicotine or tobacco for at least 4 weeks before the procedure. These include cigarettes, chewing tobacco, and vaping devices, such as e-cigarettes. If you need help quitting, ask your health care provider. You may be given medicines to thin the endometrium. Ask your health care provider what steps will be taken to help prevent infection. These steps may include: Removing hair at the surgery site. Washing skin with a germ-killing soap. Taking antibiotic medicine. Plan to have a responsible adult take you home from the hospital or clinic. Plan to have a responsible adult care for you for the time you are told after you leave the hospital or clinic. This is important. What happens during the procedure?  You will lie on an exam table with your feet and legs supported as in a pelvic exam. An IV will be inserted into one of your veins. You will be given a medicine to help you relax (sedative). A surgical tool with a light and camera (resectoscope) will be inserted into your vagina and moved into your uterus. This allows your surgeon to see inside your uterus. Endometrial tissue will be destroyed and removed, using one of the following methods: Radiofrequency. This  uses an electrical current to destroy the endometrium. Cryotherapy. This uses extreme cold to freeze the endometrium. Heated fluid. This uses a heated salt and water (saline) solution to  destroy the endometrium. Microwave. This uses high-energy microwaves to heat up the endometrium and destroy it. Thermal balloon. This involves inserting a catheter with a balloon tip into the uterus. The balloon tip is filled with heated fluid to destroy the endometrium. The procedure may vary among health care providers and hospitals. What happens after the procedure? Your blood pressure, heart rate, breathing rate, and blood oxygen level will be monitored until you leave the hospital or clinic. You may have vaginal bleeding for 4-6 weeks after the procedure. You may also have: Cramps. A thin, watery vaginal discharge that is light pink or brown. A need to urinate more than usual. Nausea. If you were given a sedative during the procedure, it can affect you for several hours. Do not drive or operate machinery until your health care provider says that it is safe. Do not have sex or insert anything into your vagina until your health care provider says it is safe. Summary Endometrial ablation is done to treat many causes of heavy menstrual bleeding. The procedure destroys the thin inner layer of the lining of the uterus (endometrium). This procedure is often done as an alternative to major surgery, such as removal of the uterus and cervix (hysterectomy). Plan to have a responsible adult take you home from the hospital or clinic. This information is not intended to replace advice given to you by your health care provider. Make sure you discuss any questions you have with your health care provider. Document Revised: 11/14/2019 Document Reviewed: 11/14/2019 Elsevier Patient Education  [place]. Endometrial Ablation, Care After The following information offers guidance on how to care for yourself after your procedure. Your health care provider may also give you more specific instructions. If you have problems or questions, contact your health care provider. What can I expect after the  procedure? After the procedure, it is common to have: A need to urinate more often than usual for the first 24 hours. Cramps that feel like menstrual cramps. These may last for 1-2 days. A thin, watery vaginal discharge that is light pink or brown. This may last for a few weeks. Discharge will be heavy for the first few days after your procedure. You may need to wear a sanitary pad. Nausea. Vaginal bleeding for 4-6 weeks after the procedure, as tissue healing occurs. Follow these instructions at home: Medicines  Take over-the-counter and prescription medicines only as told by your health care provider. If you were prescribed an antibiotic medicine, take it as told by your health care provider. Do not stop taking the antibiotic even if you start to feel better. Ask your health care provider if the medicine prescribed to you: Requires you to avoid driving or using machinery. Can cause constipation. You may need to take these actions to prevent or treat constipation: Drink enough fluid to keep your urine pale yellow. Take over-the-counter or prescription medicines. Eat foods that are high in fiber, such as beans, whole grains, and fresh fruits and vegetables. Limit foods that are high in fat and processed sugars, such as fried or sweet foods. Activity If you were given a sedative during the procedure, it can affect you for several hours. Do not drive or operate machinery until your health care provider says that it is safe. Do not have sex or put anything into  your vagina until your health care provider says that it is safe. Do not lift anything that is heavier than 5 lb (2.3 kg), or the limit that you are told, until your health care provider says that it is safe. Return to your normal activities as told by your health care provider. Ask your health care provider what activities are safe for you. General instructions Do not take baths, swim, or use a hot tub until your health care provider  says that it is safe. You will be able to take showers. Check your vaginal area every day for signs of infection. Check for: Redness, swelling, or more pain. More blood coming from your vagina. A bad-smelling discharge. Keep all follow-up visits. This is important. Contact a health care provider if: You have vaginal redness, swelling, or more pain. You have discharge or bleeding from your vagina that is getting worse. You have a bad-smelling vaginal discharge. You have a fever or chills. You have trouble urinating. Get help right away if: You have heavy, bright red vaginal bleeding that may include blood clots. You have severe cramps that do not get better with medicine. Summary After endometrial ablation, it is normal to have a thin, watery vaginal discharge that is light pink or brown. This may last a few weeks and may be heavier right after the procedure. Vaginal bleeding is common after the procedure and should get better with time. Check your vaginal area every day for signs of infection, such as a bad-smelling discharge. Keep all follow-up visits. This is important. This information is not intended to replace advice given to you by your health care provider. Make sure you discuss any questions you have with your health care provider. Document Revised: 11/14/2019 Document Reviewed: 11/14/2019 Elsevier Patient Education  [place].

## 2022-05-11 NOTE — Progress Notes (Signed)
Subjective:     Gabriella Snyder is a 47 y.o. female here for a routine exam.  Current complaints:  Seeing hematology for anemia. Pt reports heavy menstrual bleeding. Getting iron transfusions. Bleeding very heavy every 2 weeks. Has completed childbearing but, wants to consider conservative measures.   Gynecologic History Patient's last menstrual period was 05/02/2022. Contraception: tubal ligation Last Pap: 06/04/2019. Results were: normal PAP, trich noted.   Last mammogram: 06/03/2019. Results were: normal  Obstetric History OB History  Gravida Para Term Preterm AB Living  '8 5 5 '$ 0 2 5  SAB IAB Ectopic Multiple Live Births  1 0 0 0      # Outcome Date GA Lbr Len/2nd Weight Sex Delivery Anes PTL Lv  8 SAB           7 AB           6 Term      Vag-Spont     5 Term      Vag-Spont     4 Term      Vag-Spont     3 Term      Vag-Spont     2 Term      CS-Unspec     1 Gravida              The following portions of the patient's history were reviewed and updated as appropriate: allergies, current medications, past family history, past medical history, past social history, past surgical history, and problem list.  Review of Systems Pertinent items are noted in HPI.    Objective:  BP 124/73   Pulse (!) 57   Ht 5' 10.5" (1.791 m)   Wt 247 lb (112 kg)   LMP 05/02/2022   BMI 34.94 kg/m  General Appearance:    Alert, cooperative, no distress, appears stated age  Head:    Normocephalic, without obvious abnormality, atraumatic  Eyes:    conjunctiva/corneas clear, EOM's intact, both eyes  Ears:    Normal external ear canals, both ears  Nose:   Nares normal, septum midline, mucosa normal, no drainage    or sinus tenderness  Throat:   Lips, mucosa, and tongue normal; teeth and gums normal  Neck:   Supple, symmetrical, trachea midline, no adenopathy;    thyroid:  no enlargement/tenderness/nodules  Back:     Symmetric, no curvature, ROM normal, no CVA tenderness  Lungs:     respirations  unlabored  Chest Wall:    No tenderness or deformity   Heart:    Regular rate and rhythm  Breast Exam:    No tenderness, masses, or nipple abnormality  Abdomen:     Soft, non-tender, bowel sounds active all four quadrants,    no masses, no organomegaly  Genitalia:    Normal female without lesion, discharge or tenderness     Extremities:   Extremities normal, atraumatic, no cyanosis or edema  Pulses:   2+ and symmetric all extremities  Skin:   Skin color, texture, turgor normal, no rashes or lesions   GYN PROCEDURE  The indications for endometrial biopsy were reviewed.   Risks of the biopsy including cramping, bleeding, infection, uterine perforation, inadequate specimen and need for additional procedures  were discussed. The patient states she understands and agrees to undergo procedure today. Consent was signed. Time out was performed. Urine HCG was negative. A sterile speculum was placed in the patient's vagina and the cervix was prepped with Betadine. A single-toothed tenaculum was placed on the anterior  lip of the cervix to stabilize it. The 3 mm pipelle was introduced into the endometrial cavity without difficulty to a depth of 9cm, and a moderate amount of tissue was obtained and sent to pathology. The instruments were removed from the patient's vagina. Minimal bleeding from the cervix was noted. The patient tolerated the procedure well.    Assessment:    Healthy female exam.  AUB- requests treatment. Options reviewed including IUD, endo ablation and hysterectomy. S/p endo bx today.  Routine post-procedure instructions were given to the patient. The patient will follow up to review the results and for further management.    Plan:  F/u surg path  Patient desires surgical management with endometrial ablation using Novasure.  The risks of surgery were discussed in detail with the patient including but not limited to: bleeding which may require transfusion or reoperation; infection which may  require prolonged hospitalization or re-hospitalization and antibiotic therapy; injury to bowel, bladder, ureters and major vessels or other surrounding organs; need for additional procedures including laparotomy; thromboembolic phenomenon, incisional problems and other postoperative or anesthesia complications.  Patient was told that the likelihood that her condition and symptoms will be treated effectively with this surgical management was very high; the postoperative expectations were also discussed in detail. The patient also understands the alternative treatment options which were discussed in full. All questions were answered.  She was told that she will be contacted by our surgical scheduler regarding the time and date of her surgery; routine preoperative instructions of having nothing to eat or drink after midnight on the day prior to surgery and also coming to the hospital 1 1/2 hours prior to her time of surgery were also emphasized.  She was told she may be called for a preoperative appointment about a week prior to surgery and will be given further preoperative instructions at that visit. Printed patient education handouts about the procedure were given to the patient to review at home.   Gabriella Snyder was seen today for gynecologic exam.  Diagnoses and all orders for this visit:  Well female exam with routine gynecological exam -     Cancel: Cytology - PAP( [facility]) -     Cytology - PAP( [facility])  Breast cancer screening by mammogram -     MM 3D SCREEN BREAST BILATERAL; Future  Colon cancer screening -     Ambulatory referral to Gastroenterology  Abnormal uterine bleeding -     US PELVIS TRANSVAGINAL NON-OB (TV ONLY); Future -     POCT urine pregnancy -     Surgical pathology   Aron Needles L. Harraway-Smith, M.D., [place]lynn June

## 2022-05-13 LAB — SURGICAL PATHOLOGY

## 2022-05-17 ENCOUNTER — Telehealth: Payer: Self-pay

## 2022-05-17 LAB — CYTOLOGY - PAP
Chlamydia: NEGATIVE
Comment: NEGATIVE
Comment: NEGATIVE
Comment: NEGATIVE
Comment: NORMAL
Diagnosis: NEGATIVE
Diagnosis: REACTIVE
High risk HPV: NEGATIVE
Neisseria Gonorrhea: NEGATIVE
Trichomonas: NEGATIVE

## 2022-05-17 NOTE — Telephone Encounter (Signed)
-----   Message from Lavonia Drafts, MD sent at 05/16/2022  1:00 PM EST ----- Please call pt. Her Endo bx is negative. I still need the results of her [place].   Thx,  Clh-S

## 2022-05-17 NOTE — Telephone Encounter (Signed)
Patient called and made aware of negative endometrial biopsy. Patient is scheduled to have her ultrasound perform on Thursday Jan 11th. Patient also given surgery date March 5th and made aware that she should get letter in mail with details.  Kathrene Alu RN

## 2022-05-18 ENCOUNTER — Encounter: Payer: Self-pay | Admitting: Obstetrics & Gynecology

## 2022-05-19 ENCOUNTER — Inpatient Hospital Stay (HOSPITAL_BASED_OUTPATIENT_CLINIC_OR_DEPARTMENT_OTHER): Admission: RE | Admit: 2022-05-19 | Payer: BC Managed Care – PPO | Source: Ambulatory Visit

## 2022-05-19 ENCOUNTER — Ambulatory Visit (HOSPITAL_BASED_OUTPATIENT_CLINIC_OR_DEPARTMENT_OTHER): Admission: RE | Admit: 2022-05-19 | Payer: BC Managed Care – PPO | Source: Ambulatory Visit

## 2022-05-23 ENCOUNTER — Ambulatory Visit (INDEPENDENT_AMBULATORY_CARE_PROVIDER_SITE_OTHER): Payer: BC Managed Care – PPO

## 2022-05-23 DIAGNOSIS — N939 Abnormal uterine and vaginal bleeding, unspecified: Secondary | ICD-10-CM | POA: Diagnosis not present

## 2022-05-31 ENCOUNTER — Encounter: Payer: Self-pay | Admitting: Family Medicine

## 2022-05-31 ENCOUNTER — Telehealth (INDEPENDENT_AMBULATORY_CARE_PROVIDER_SITE_OTHER): Payer: BC Managed Care – PPO | Admitting: Family Medicine

## 2022-05-31 VITALS — Ht 70.5 in | Wt 247.0 lb

## 2022-05-31 DIAGNOSIS — G894 Chronic pain syndrome: Secondary | ICD-10-CM

## 2022-05-31 DIAGNOSIS — M546 Pain in thoracic spine: Secondary | ICD-10-CM

## 2022-05-31 DIAGNOSIS — G8929 Other chronic pain: Secondary | ICD-10-CM

## 2022-05-31 NOTE — Progress Notes (Signed)
Virtual Visit via Video Note  I connected with [name] on 05/31/22 at  1:00 PM EST by a video enabled telemedicine application and verified that I am speaking with the correct person using two identifiers.  Location: Gabriella: Home Provider: [facility]   I discussed the limitations of evaluation and management by telemedicine and the availability of in person appointments. The Gabriella expressed understanding and agreed to proceed.  History of Present Illness: Gabriella Snyder is a 47 year old female with medical history significant for beta thalassemia, chronic pain syndrome, chronic back pain, prediabetes, and obesity presents via video to discuss ongoing chronic back pain.  Gabriella states that back pain has been present over the past several years.  She is currently under the care of of pain management for this problem.  Gabriella was previously referred to orthopedic specialist, but has not establish care.  Gabriella is also requesting completion of FMLA forms. Today, Gabriella rates back pain as 5/10, constant, and throbbing.  She last had oxycodone this a.m. with moderate relief.  She denies any headache, chest pains, shortness of breath, fatigue, or urinary symptoms.   Past Medical History:  Diagnosis Date   Anemia    Anxiety    Chronic pain    Degenerative disc disease, cervical    Depression    Heart murmur    "when I was younger; it closed up"   History of blood transfusion 1993   "related to the C-section   Migraine    "a couple times/yr" (02/25/2014)   Muscle spasm    Pyelonephritis 02/25/2014    Social History   Socioeconomic History   Marital status: Single    Spouse name: Not on file   Number of children: Not on file   Years of education: Not on file   Highest education level: Not on file  Occupational History   Not on file  Tobacco Use   Smoking status: Every Day    Packs/day: 0.25    Years: 6.00    Total pack years: 1.50    Types:  Cigarettes   Smokeless tobacco: Never  Vaping Use   Vaping Use: Never used  Substance and Sexual Activity   Alcohol use: Not Currently   Drug use: Yes    Types: Marijuana    Comment: 3 nights ago    Sexual activity: Yes    Birth control/protection: Pill  Other Topics Concern   Not on file  Social History Narrative   Not on file   Social Determinants of Health   Financial Resource Strain: Not on file  Food Insecurity: Not on file  Transportation Needs: Not on file  Physical Activity: Not on file  Stress: Not on file  Social Connections: Not on file  Intimate Partner Violence: Not on file   No Known Allergies Immunization History  Administered Date(s) Administered   HPV 9-valent 01/15/2018, 03/27/2018   HPV Quadrivalent 07/18/2018   Influenza,inj,Quad PF,6+ Mos 02/27/2014, 01/15/2018   Influenza-Unspecified 04/04/2022   Tdap 03/27/2018    Observations/Objective:   Assessment and Plan:  1. Chronic left-sided thoracic back pain Previously sent referral to orthopedic specialist, Gabriella encouraged to establish care. Continue following with pain management for this problem.  2. Chronic pain syndrome  Follow Up Instructions: Gabriella will follow-up in 6 months for prediabetes as scheduled.  Also, recommend that Gabriella reestablish care with orthopedic specialist.  Continue to follow with pain management.  FMLA paperwork will be completed within 7 business  days.   I discussed the assessment and treatment plan with the Gabriella. The Gabriella was provided an opportunity to ask questions and all were answered. The Gabriella agreed with the plan and demonstrated an understanding of the instructions.   The Gabriella was advised to call back or seek an in-person evaluation if the symptoms worsen or if the condition fails to improve as anticipated.  I provided 12 minutes of non-face-to-face time during this encounter.  Gabriella Pounds  APRN, MSN, FNP-C Gabriella Snyder [address] [place], [place] [address] [phone]

## 2022-06-20 ENCOUNTER — Other Ambulatory Visit: Payer: Self-pay | Admitting: Physician Assistant

## 2022-06-20 DIAGNOSIS — D500 Iron deficiency anemia secondary to blood loss (chronic): Secondary | ICD-10-CM

## 2022-06-21 ENCOUNTER — Inpatient Hospital Stay (HOSPITAL_BASED_OUTPATIENT_CLINIC_OR_DEPARTMENT_OTHER): Payer: BC Managed Care – PPO | Admitting: Physician Assistant

## 2022-06-21 ENCOUNTER — Inpatient Hospital Stay: Payer: BC Managed Care – PPO | Attending: Physician Assistant

## 2022-06-21 ENCOUNTER — Telehealth: Payer: Self-pay

## 2022-06-21 VITALS — BP 108/66 | HR 68 | Temp 97.7°F | Resp 13 | Wt 255.9 lb

## 2022-06-21 DIAGNOSIS — Z807 Family history of other malignant neoplasms of lymphoid, hematopoietic and related tissues: Secondary | ICD-10-CM | POA: Diagnosis not present

## 2022-06-21 DIAGNOSIS — F1290 Cannabis use, unspecified, uncomplicated: Secondary | ICD-10-CM | POA: Insufficient documentation

## 2022-06-21 DIAGNOSIS — R42 Dizziness and giddiness: Secondary | ICD-10-CM | POA: Diagnosis not present

## 2022-06-21 DIAGNOSIS — D500 Iron deficiency anemia secondary to blood loss (chronic): Secondary | ICD-10-CM | POA: Insufficient documentation

## 2022-06-21 DIAGNOSIS — Z8049 Family history of malignant neoplasm of other genital organs: Secondary | ICD-10-CM | POA: Insufficient documentation

## 2022-06-21 DIAGNOSIS — D563 Thalassemia minor: Secondary | ICD-10-CM | POA: Diagnosis not present

## 2022-06-21 DIAGNOSIS — F17210 Nicotine dependence, cigarettes, uncomplicated: Secondary | ICD-10-CM | POA: Diagnosis not present

## 2022-06-21 LAB — CBC WITH DIFFERENTIAL (CANCER CENTER ONLY)
Abs Immature Granulocytes: 0.02 10*3/uL (ref 0.00–0.07)
Basophils Absolute: 0 10*3/uL (ref 0.0–0.1)
Basophils Relative: 0 %
Eosinophils Absolute: 0.1 10*3/uL (ref 0.0–0.5)
Eosinophils Relative: 2 %
HCT: 38.1 % (ref 36.0–46.0)
Hemoglobin: 12.7 g/dL (ref 12.0–15.0)
Immature Granulocytes: 0 %
Lymphocytes Relative: 35 %
Lymphs Abs: 2.7 10*3/uL (ref 0.7–4.0)
MCH: 26.1 pg (ref 26.0–34.0)
MCHC: 33.3 g/dL (ref 30.0–36.0)
MCV: 78.2 fL — ABNORMAL LOW (ref 80.0–100.0)
Monocytes Absolute: 0.4 10*3/uL (ref 0.1–1.0)
Monocytes Relative: 5 %
Neutro Abs: 4.5 10*3/uL (ref 1.7–7.7)
Neutrophils Relative %: 58 %
Platelet Count: 242 10*3/uL (ref 150–400)
RBC: 4.87 MIL/uL (ref 3.87–5.11)
RDW: 13.9 % (ref 11.5–15.5)
WBC Count: 7.7 10*3/uL (ref 4.0–10.5)
nRBC: 0 % (ref 0.0–0.2)

## 2022-06-21 LAB — IRON AND IRON BINDING CAPACITY (CC-WL,HP ONLY)
Iron: 98 ug/dL (ref 28–170)
Saturation Ratios: 34 % — ABNORMAL HIGH (ref 10.4–31.8)
TIBC: 293 ug/dL (ref 250–450)
UIBC: 195 ug/dL (ref 148–442)

## 2022-06-21 LAB — CMP (CANCER CENTER ONLY)
ALT: 8 U/L (ref 0–44)
AST: 14 U/L — ABNORMAL LOW (ref 15–41)
Albumin: 3.9 g/dL (ref 3.5–5.0)
Alkaline Phosphatase: 55 U/L (ref 38–126)
Anion gap: 7 (ref 5–15)
BUN: 11 mg/dL (ref 6–20)
CO2: 23 mmol/L (ref 22–32)
Calcium: 9.2 mg/dL (ref 8.9–10.3)
Chloride: 107 mmol/L (ref 98–111)
Creatinine: 1 mg/dL (ref 0.44–1.00)
GFR, Estimated: >60 mL/min (ref >60)
Glucose, Bld: 144 mg/dL — ABNORMAL HIGH (ref 70–99)
Potassium: 4.1 mmol/L (ref 3.5–5.1)
Sodium: 137 mmol/L (ref 135–145)
Total Bilirubin: 0.5 mg/dL (ref 0.3–1.2)
Total Protein: 7.2 g/dL (ref 6.5–8.1)

## 2022-06-21 LAB — FERRITIN: Ferritin: 61 ng/mL (ref 11–307)

## 2022-06-21 NOTE — Telephone Encounter (Signed)
-----   Message from Lincoln Brigham, PA-C sent at 06/21/2022  1:51 PM EST ----- Please notify patient that iron levels are normal. No need for IV iron at this time. We will see her back in 6 months for a lab check.

## 2022-06-21 NOTE — Telephone Encounter (Signed)
Pt advised with VU 

## 2022-06-21 NOTE — Progress Notes (Signed)
[place] [phone]   [phone]  PROGRESS NOTE  Patient Care Team: Dorena Dew, FNP as PCP - General (Family Medicine)  Hematological/Oncological History Labs from PCP Dr. Forbes Cellar at [facility]: -Hgb electrophoresis showed beta thalassemia minor.  -Ferritin 9  2.  12/03/2021: Establish care with [facility] Hematology  3.  12/09/2021-12/16/2021: Received IV Feraheme 510 mg x 2 doses  CHIEF COMPLAINTS/PURPOSE OF CONSULTATION:  Microcytic anemia, iron deficiency, beta-thalassemia minor.   HISTORY OF PRESENTING ILLNESS:  Gabriella Snyder 46 y.o. female presents to hematology clinic for iron deficiency anemia. She was last seen on 03/18/2022.  In the interim since last visit, she denies any changes to her health.  On exam today, Gabriella Snyder reports her energy levels are overall stable.  She does have fatigue and contributes some of this to waking up at 3 AM for work.  She reports having occasional dizzy spells and decreased vision in her right eye.  She started on birth control approximately 3 months ago and her menstrual cycles mildly improved.  This past cycle she bled every day with light bleeding.  She is scheduled to undergo D&C with hysteroscopy on 07/12/2022.  She denies nausea, vomiting or abdominal pain.  Her bowel habits are unchanged.  She denies easy bruising or signs of active bleeding for her menstrual bleeding. She denies fevers, chills, night sweats, chest pain or cough. She has no other complaints. Rest of the 10 point ROS is below.    MEDICAL HISTORY:  Past Medical History:  Diagnosis Date   Anemia    Anxiety    Chronic pain    Degenerative disc disease, cervical    Depression    Heart murmur    "when I was younger; it closed up"   History of blood transfusion 1993   "related to the C-section   Migraine    "a couple times/yr" (02/25/2014)   Muscle spasm    Pyelonephritis 02/25/2014    SURGICAL HISTORY: Past  Surgical History:  Procedure Laterality Date   [place] OF UTERUS  2003   OPEN REDUCTION INTERNAL FIXATION (ORIF) METACARPAL Right 12/12/2019   Procedure: OPEN REDUCTION INTERNAL FIXATION (ORIF) MIDDLE AND DISTAL PHALANX FRACTURE;  Surgeon: Leanora Cover, MD;  Location: [place];  Service: Orthopedics;  Laterality: Right;   TUBAL LIGATION  2011   TUMOR EXCISION Right ~ 2004   "off my back"    SOCIAL HISTORY: Social History   Socioeconomic History   Marital status: Single    Spouse name: Not on file   Number of children: Not on file   Years of education: Not on file   Highest education level: Not on file  Occupational History   Not on file  Tobacco Use   Smoking status: Every Day    Packs/day: 0.25    Years: 6.00    Total pack years: 1.50    Types: Cigarettes   Smokeless tobacco: Never  Vaping Use   Vaping Use: Never used  Substance and Sexual Activity   Alcohol use: Not Currently   Drug use: Yes    Types: Marijuana    Comment: 3 nights ago    Sexual activity: Yes    Birth control/protection: Pill  Other Topics Concern   Not on file  Social History Narrative   Not on file   Social Determinants of Health   Financial Resource Strain: Not on file  Food Insecurity:  Not on file  Transportation Needs: Not on file  Physical Activity: Not on file  Stress: Not on file  Social Connections: Not on file  Intimate Partner Violence: Not on file    FAMILY HISTORY: Family History  Problem Relation Age of Onset   Diabetes Mother    Hypertension Father    Cancer Maternal Grandfather    Stroke Paternal Grandfather    Cancer Maternal Aunt        cervical   Cancer Cousin        lymphoma    ALLERGIES:  has No Known Allergies.  MEDICATIONS:  Current Outpatient Medications  Medication Sig Dispense Refill   ascorbic acid (VITAMIN C) 500 MG tablet Take 500 mg by mouth daily.     cyanocobalamin (VITAMIN B12) 1000 MCG  tablet Take 1 tablet by mouth daily.     Drospirenone (SLYND) 4 MG TABS Take 1 tablet by mouth daily at 12 noon. 90 tablet 1   ergocalciferol (VITAMIN D2) 1.25 MG (50000 UT) capsule Take 50,000 Units by mouth once a week.     ibuprofen (ADVIL) 800 MG tablet Take 800 mg by mouth every 8 (eight) hours as needed for mild pain or moderate pain.     methocarbamol (ROBAXIN) 750 MG tablet Take 750 mg by mouth 3 (three) times daily.     Multiple Vitamin (MULTIVITAMIN) tablet Take 1 tablet by mouth daily.     oxyCODONE (ROXICODONE) 15 MG immediate release tablet Take 15 mg by mouth 4 (four) times daily as needed.     gabapentin (NEURONTIN) 300 MG capsule Take 1 capsule (300 mg total) by mouth at bedtime. (Patient not taking: Reported on 05/31/2022) 90 capsule 1   Prenatal Multivit-Min-Fe-FA (PRE-NATAL PO) Take by mouth daily. (Patient not taking: Reported on 04/05/2022)     No current facility-administered medications for this visit.    REVIEW OF SYSTEMS:   Constitutional: ( - ) fevers, ( - )  chills , ( - ) night sweats Eyes: ( - ) blurriness of vision, ( - ) double vision, ( - ) watery eyes Ears, nose, mouth, throat, and face: ( - ) mucositis, ( - ) sore throat Respiratory: ( - ) cough, ( - ) dyspnea, ( - ) wheezes Cardiovascular: ( - ) palpitation, ( - ) chest discomfort, ( - ) lower extremity swelling Gastrointestinal:  ( - ) nausea, ( - ) heartburn, ( - ) change in bowel habits Skin: ( - ) abnormal skin rashes Lymphatics: ( - ) new lymphadenopathy, ( - ) easy bruising Neurological: ( - ) numbness, ( - ) tingling, ( - ) new weaknesses Behavioral/Psych: ( - ) mood change, ( - ) new changes  All other systems were reviewed with the patient and are negative.  PHYSICAL EXAMINATION: ECOG PERFORMANCE STATUS: 1 - Symptomatic but completely ambulatory  Vitals:   06/21/22 1009  BP: 108/66  Pulse: 68  Resp: 13  Temp: 97.7 F (36.5 C)  SpO2: 100%    Filed Weights   06/21/22 1009  Weight: 255  lb 14.4 oz (116.1 kg)     GENERAL: well appearing female in NAD  SKIN: skin color, texture, turgor are normal, no rashes or significant lesions EYES: conjunctiva are pink and non-injected, sclera clear LUNGS: clear to auscultation and percussion with normal breathing effort HEART: regular rate & rhythm and no murmurs and no lower extremity edema Musculoskeletal: no cyanosis of digits and no clubbing  PSYCH: alert & oriented x 3, fluent  speech NEURO: no focal motor/sensory deficits  LABORATORY DATA:  I have reviewed the data as listed    Latest Ref Rng & Units 06/21/2022    9:39 AM 03/18/2022   12:48 PM 12/18/2021    7:22 AM  CBC  WBC 4.0 - 10.5 K/uL 7.7  9.0  8.2   Hemoglobin 12.0 - 15.0 g/dL 12.7  12.9  12.1   Hematocrit 36.0 - 46.0 % 38.1  39.0  38.3   Platelets 150 - 400 K/uL 242  224  216        Latest Ref Rng & Units 06/21/2022    9:39 AM 12/18/2021    7:22 AM 12/03/2021    4:17 PM  CMP  Glucose 70 - 99 mg/dL 144  105  95   BUN 6 - 20 mg/dL 11  9  9   $ Creatinine 0.44 - 1.00 mg/dL 1.00  1.21  1.05   Sodium 135 - 145 mmol/L 137  140  139   Potassium 3.5 - 5.1 mmol/L 4.1  4.0  4.5   Chloride 98 - 111 mmol/L 107  107  108   CO2 22 - 32 mmol/L 23  25  26   $ Calcium 8.9 - 10.3 mg/dL 9.2  9.6  9.4   Total Protein 6.5 - 8.1 g/dL 7.2   8.0   Total Bilirubin 0.3 - 1.2 mg/dL 0.5   0.6   Alkaline Phos 38 - 126 U/L 55   53   AST 15 - 41 U/L 14   14   ALT 0 - 44 U/L 8   7      ASSESSMENT & PLAN Gabriella Snyder is a 47 y.o.female who returns for a follow up for iron deficiency anemia.   #Iron Deficiency Anemia: --Secondary heavy/irregular menstrual bleeding --Not taking birth control as she did not want to increase risk of blood clots with daily smoking --Scheduled for D&C with hysteroscopy on 07/12/2022.  --Unable to tolerate PO due to GI intolerance with constipation --Received IV Feraheme 510 mg x 2 doses on 12/09/2021 and 8/102/2023 --Labs from today show no evidence of  anemia with Hgb 12.97. Iron panel shows iron 98, TIBC 293, saturation 34%, ferritin 61 --No need for IV iron at this time. If patient needs future IV iron, we will premedicate as patient developed itchiness over chest with previous IV iron.  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 --Patient is aware to go to nearest ED if she sudden loss of vision or worsening dizziness.  No orders of the defined types were placed in this encounter.   All questions were answered. The patient knows to call the clinic with any problems, questions or concerns.  I have spent a total of 25 minutes minutes of face-to-face and non-face-to-face time, preparing to see the patient,  performing a medically appropriate examination, counseling and educating the patient, documenting clinical information in the electronic health record,and care coordination.   Dede Query, PA-C Department of Hematology/Oncology [place] at [facility] Phone: [phone]

## 2022-06-22 ENCOUNTER — Telehealth: Payer: Self-pay | Admitting: Physician Assistant

## 2022-06-22 NOTE — Telephone Encounter (Signed)
Contacted patient to scheduled appointments. Patient is aware of appointments that are scheduled.   

## 2022-06-24 ENCOUNTER — Encounter: Payer: Self-pay | Admitting: Obstetrics & Gynecology

## 2022-06-27 ENCOUNTER — Emergency Department (HOSPITAL_COMMUNITY)
Admission: EM | Admit: 2022-06-27 | Discharge: 2022-06-27 | Disposition: A | Discharge status: Home / Self Care | Payer: BC Managed Care – PPO | Attending: Emergency Medicine | Admitting: Emergency Medicine

## 2022-06-27 ENCOUNTER — Encounter (HOSPITAL_COMMUNITY): Payer: Self-pay

## 2022-06-27 ENCOUNTER — Other Ambulatory Visit: Payer: Self-pay

## 2022-06-27 DIAGNOSIS — M545 Low back pain, unspecified: Secondary | ICD-10-CM | POA: Diagnosis not present

## 2022-06-27 DIAGNOSIS — M549 Dorsalgia, unspecified: Secondary | ICD-10-CM

## 2022-06-27 MED ORDER — PREDNISONE 50 MG PO TABS: ORAL_TABLET | ORAL | 0 refills | Status: DC

## 2022-06-27 MED ORDER — LIDOCAINE 5 % EX PTCH: 1.0000 | MEDICATED_PATCH | CUTANEOUS | 0 refills | Status: DC

## 2022-06-27 MED ORDER — DIAZEPAM 2 MG PO TABS: 2.0000 mg | ORAL_TABLET | Freq: Four times a day (QID) | ORAL | 0 refills | Status: DC | PRN

## 2022-06-27 NOTE — ED Provider Notes (Signed)
[place] AT [facility] Provider Note   CSN: [id number] Arrival date & time: 06/27/22  [id number]     History  Chief Complaint  Patient presents with   Back Pain    Gabriella Snyder is a 47 y.o. female.  47 year old female presents with back pain times several weeks.  Pain started when she was at work and she was reaching for an object.  Pain is characterized as sharp and starts at her upper trapezius and goes to her mid back.  No loss of bowel or bladder function.  Denies any urinary symptoms.  No peripheral weakness.  Has been medicating with home medications without relief.  Denies any rashes.       Home Medications Prior to Admission medications   Medication Sig Start Date End Date Taking? Authorizing Provider  ascorbic acid (VITAMIN C) 500 MG tablet Take 500 mg by mouth daily.    [provider]  cyanocobalamin (VITAMIN B12) 1000 MCG tablet Take 1 tablet by mouth daily. 01/05/22   [provider]  Drospirenone (SLYND) 4 MG TABS Take 1 tablet by mouth daily at 12 noon. 04/05/22   Dorena Dew, FNP  ergocalciferol (VITAMIN D2) 1.25 MG (50000 UT) capsule Take 50,000 Units by mouth once a week.    [provider]  ibuprofen (ADVIL) 800 MG tablet Take 800 mg by mouth every 8 (eight) hours as needed for mild pain or moderate pain.    [provider]  methocarbamol (ROBAXIN) 750 MG tablet Take 750 mg by mouth 3 (three) times daily.    [provider]  Multiple Vitamin (MULTIVITAMIN) tablet Take 1 tablet by mouth daily.    [provider]  oxyCODONE (ROXICODONE) 15 MG immediate release tablet Take 15 mg by mouth 4 (four) times daily as needed. 03/04/22   [provider]      Allergies    Patient has no known allergies.    Review of Systems   Review of Systems  All other systems reviewed and are negative.   Physical Exam Updated Vital Signs BP (!) 151/97   Pulse 76   Temp (!)  97.5 F (36.4 C) (Oral)   Resp 19   SpO2 100%  Physical Exam Vitals and nursing note reviewed.  Constitutional:      General: She is not in acute distress.    Appearance: Normal appearance. She is well-developed. She is not toxic-appearing.  HENT:     Head: Normocephalic and atraumatic.  Eyes:     General: Lids are normal.     Conjunctiva/sclera: Conjunctivae normal.     Pupils: Pupils are equal, round, and reactive to light.  Neck:     Thyroid: No thyroid mass.     Trachea: No tracheal deviation.  Cardiovascular:     Rate and Rhythm: Normal rate and regular rhythm.     Heart sounds: Normal heart sounds. No murmur heard.    No gallop.  Pulmonary:     Effort: Pulmonary effort is normal. No respiratory distress.     Breath sounds: Normal breath sounds. No stridor. No decreased breath sounds, wheezing, rhonchi or rales.  Abdominal:     General: There is no distension.     Palpations: Abdomen is soft.     Tenderness: There is no abdominal tenderness. There is no rebound.  Musculoskeletal:        General: Normal range of motion.     Cervical back: Normal range of motion and  neck supple. Spasms and tenderness present.     Thoracic back: Spasms and tenderness present.       Back:  Skin:    General: Skin is warm and dry.     Findings: No abrasion or rash.  Neurological:     Mental Status: She is alert and oriented to person, place, and time. Mental status is at baseline.     GCS: GCS eye subscore is 4. GCS verbal subscore is 5. GCS motor subscore is 6.     Cranial Nerves: No cranial nerve deficit.     Sensory: No sensory deficit.     Motor: Motor function is intact.  Psychiatric:        Attention and Perception: Attention normal.        Speech: Speech normal.        Behavior: Behavior normal.     ED Results / Procedures / Treatments   Labs (all labs ordered are listed, but only abnormal results are displayed) Labs Reviewed - No data to  display  EKG None  Radiology No results found.  Procedures Procedures    Medications Ordered in ED Medications - No data to display  ED Course/ Medical Decision Making/ A&P                             Medical Decision Making  Patient with musculoskeletal back pain.  He is reproducible.  Do not feel that she would benefit from imaging.  Do not feel that she has any acute neurological emergency at this time.  Will treat with lidocaine patches, add low-dose Valium, prednisone.  Will follow-up with her doctor as needed        Final Clinical Impression(s) / ED Diagnoses Final diagnoses:  None    Rx / DC Orders ED Discharge Orders     None         Lacretia Leigh, MD 06/27/22 [phone]

## 2022-06-27 NOTE — ED Triage Notes (Addendum)
Patient said on 1/31 at work she threw something and had a sharp pain shooting up her back and into her eyes. Has been taking 61m oxycodone and 2 muscle relaxers but nothing is touching her pain. No incontinence.

## 2022-06-29 ENCOUNTER — Ambulatory Visit (INDEPENDENT_AMBULATORY_CARE_PROVIDER_SITE_OTHER): Payer: BC Managed Care – PPO

## 2022-06-29 DIAGNOSIS — Z1231 Encounter for screening mammogram for malignant neoplasm of breast: Secondary | ICD-10-CM | POA: Diagnosis not present

## 2022-06-30 ENCOUNTER — Encounter (HOSPITAL_BASED_OUTPATIENT_CLINIC_OR_DEPARTMENT_OTHER): Payer: Self-pay | Admitting: Obstetrics & Gynecology

## 2022-06-30 NOTE — Progress Notes (Addendum)
Spoke w/ via phone for pre-op [employer] needs dos----  UPT. Surgeon orders pending.             Lab results------Current EKG dated 12/2021 in Epic. COVID test -----patient states asymptomatic no test needed Arrive at -------1400 NPO after MN NO Solid Food.  Clear liquids from MN until---1300 Med rec completed Medications to take morning of surgery -----Robaxin and Oxycodone PRN. Slynd. Diabetic medication ----- Patient instructed no nail polish to be worn day of surgery Patient instructed to bring photo id and insurance card day of surgery Patient aware to have Driver (ride ) / caregiver Husband Gabriella Snyder   for 24 hours after surgery  Patient Special Instructions ----- Pre-Op special Istructions ----- Patient verbalized understanding of instructions that were given at this phone interview. Patient denies shortness of breath, chest pain, fever, cough at this phone interview.

## 2022-07-05 DIAGNOSIS — D561 Beta thalassemia: Secondary | ICD-10-CM | POA: Insufficient documentation

## 2022-07-11 NOTE — Progress Notes (Signed)
Talked to patient. Verified time of arrival to come in at 1215. Clear liquids until 1115

## 2022-07-11 NOTE — Anesthesia Preprocedure Evaluation (Addendum)
Anesthesia Evaluation  Patient identified by MRN, date of birth, ID band Patient awake    Reviewed: Allergy & Precautions, NPO status , Patient's Chart, lab work & pertinent test results  History of Anesthesia Complications (+) PONV and history of anesthetic complications  Airway Mallampati: II  TM Distance: >3 FB Neck ROM: Full    Dental no notable dental hx. (+) Teeth Intact, Dental Advisory Given   Pulmonary Current Smoker and Patient abstained from smoking.   Pulmonary exam normal breath sounds clear to auscultation       Cardiovascular Normal cardiovascular exam Rhythm:Regular Rate:Normal     Neuro/Psych  Headaches PSYCHIATRIC DISORDERS Anxiety Depression       GI/Hepatic   Endo/Other    Renal/GU Renal diseaseLab Results      Component                Value               Date                      CREATININE               1.00                06/21/2022                BUN                      11                  06/21/2022                NA                       137                 06/21/2022                K                        4.1                 06/21/2022                CL                       107                 06/21/2022                CO2                      23                  06/21/2022             Female GU complaint (abnormal uterine bleeding)     Musculoskeletal  (+) Arthritis ,    Abdominal   Peds  Hematology  (+) Blood dyscrasia, anemia Lab Results      Component                Value               Date  WBC                      7.7                 06/21/2022                HGB                      12.7                06/21/2022                HCT                      38.1                06/21/2022                MCV                      78.2 (L)            06/21/2022                PLT                      242                 06/21/2022              Anesthesia Other  Findings   Reproductive/Obstetrics                             Anesthesia Physical Anesthesia Plan  ASA: 2  Anesthesia Plan: General   Post-op Pain Management: Tylenol PO (pre-op)*, Toradol IV (intra-op)* and Precedex   Induction: Intravenous  PONV Risk Score and Plan: Treatment may vary due to age or medical condition, Midazolam, Dexamethasone, Ondansetron, Scopolamine patch - Pre-op, Propofol infusion and TIVA  Airway Management Planned: LMA  Additional Equipment: None  Intra-op Plan:   Post-operative Plan: Extubation in OR  Informed Consent: I have reviewed the patients History and Physical, chart, labs and discussed the procedure including the risks, benefits and alternatives for the proposed anesthesia with the patient or authorized representative who has indicated his/her understanding and acceptance.     Dental advisory given  Plan Discussed with: CRNA and Surgeon  Anesthesia Plan Comments: (LMA TIVA)        Anesthesia Quick Evaluation

## 2022-07-12 ENCOUNTER — Encounter (HOSPITAL_BASED_OUTPATIENT_CLINIC_OR_DEPARTMENT_OTHER): Payer: Self-pay | Admitting: Obstetrics & Gynecology

## 2022-07-12 ENCOUNTER — Ambulatory Visit (HOSPITAL_BASED_OUTPATIENT_CLINIC_OR_DEPARTMENT_OTHER): Payer: BC Managed Care – PPO | Admitting: Anesthesiology

## 2022-07-12 ENCOUNTER — Ambulatory Visit (HOSPITAL_BASED_OUTPATIENT_CLINIC_OR_DEPARTMENT_OTHER)
Admission: RE | Admit: 2022-07-12 | Discharge: 2022-07-12 | Disposition: A | Discharge status: Home / Self Care | Payer: BC Managed Care – PPO | Attending: Obstetrics & Gynecology | Admitting: Obstetrics & Gynecology

## 2022-07-12 ENCOUNTER — Encounter (HOSPITAL_BASED_OUTPATIENT_CLINIC_OR_DEPARTMENT_OTHER)
Admission: RE | Disposition: A | Discharge status: Home / Self Care | Payer: Self-pay | Source: Home / Self Care | Attending: Obstetrics & Gynecology

## 2022-07-12 ENCOUNTER — Other Ambulatory Visit: Payer: Self-pay

## 2022-07-12 DIAGNOSIS — M1990 Unspecified osteoarthritis, unspecified site: Secondary | ICD-10-CM | POA: Insufficient documentation

## 2022-07-12 DIAGNOSIS — Z6835 Body mass index (BMI) 35.0-35.9, adult: Secondary | ICD-10-CM | POA: Insufficient documentation

## 2022-07-12 DIAGNOSIS — F32A Depression, unspecified: Secondary | ICD-10-CM | POA: Diagnosis not present

## 2022-07-12 DIAGNOSIS — F17210 Nicotine dependence, cigarettes, uncomplicated: Secondary | ICD-10-CM | POA: Insufficient documentation

## 2022-07-12 DIAGNOSIS — N939 Abnormal uterine and vaginal bleeding, unspecified: Secondary | ICD-10-CM | POA: Diagnosis present

## 2022-07-12 DIAGNOSIS — N938 Other specified abnormal uterine and vaginal bleeding: Secondary | ICD-10-CM

## 2022-07-12 DIAGNOSIS — F1290 Cannabis use, unspecified, uncomplicated: Secondary | ICD-10-CM | POA: Diagnosis not present

## 2022-07-12 DIAGNOSIS — Z79899 Other long term (current) drug therapy: Secondary | ICD-10-CM | POA: Diagnosis not present

## 2022-07-12 DIAGNOSIS — G8929 Other chronic pain: Secondary | ICD-10-CM | POA: Insufficient documentation

## 2022-07-12 DIAGNOSIS — Z01818 Encounter for other preprocedural examination: Secondary | ICD-10-CM

## 2022-07-12 DIAGNOSIS — F419 Anxiety disorder, unspecified: Secondary | ICD-10-CM | POA: Diagnosis not present

## 2022-07-12 HISTORY — DX: Prediabetes: R73.03

## 2022-07-12 HISTORY — DX: Nausea with vomiting, unspecified: R11.2

## 2022-07-12 HISTORY — DX: Nausea with vomiting, unspecified: Z98.890

## 2022-07-12 HISTORY — PX: DILITATION & CURRETTAGE/HYSTROSCOPY WITH NOVASURE ABLATION: SHX5568

## 2022-07-12 LAB — POCT PREGNANCY, URINE: Preg Test, Ur: NEGATIVE

## 2022-07-12 SURGERY — DILATATION & CURETTAGE/HYSTEROSCOPY WITH NOVASURE ABLATION
Anesthesia: General | Site: Uterus

## 2022-07-12 MED ORDER — FENTANYL CITRATE (PF) 100 MCG/2ML IJ SOLN
INTRAMUSCULAR | Status: DC | PRN
  Administered 2022-07-12 (×2): 50 ug via INTRAVENOUS

## 2022-07-12 MED ORDER — OXYCODONE HCL 5 MG PO TABS
5.0000 mg | ORAL_TABLET | Freq: Once | ORAL | Status: AC | PRN
  Administered 2022-07-12: 5 mg via ORAL

## 2022-07-12 MED ORDER — LIDOCAINE HCL (PF) 2 % IJ SOLN
INTRAMUSCULAR | Status: AC
  Filled 2022-07-12: qty 5

## 2022-07-12 MED ORDER — SCOPOLAMINE 1 MG/3DAYS TD PT72
1.0000 | MEDICATED_PATCH | TRANSDERMAL | Status: DC
  Administered 2022-07-12: 1.5 mg via TRANSDERMAL

## 2022-07-12 MED ORDER — ONDANSETRON HCL 4 MG/2ML IJ SOLN
INTRAMUSCULAR | Status: AC
  Filled 2022-07-12: qty 2

## 2022-07-12 MED ORDER — MIDAZOLAM HCL 5 MG/5ML IJ SOLN
INTRAMUSCULAR | Status: DC | PRN
  Administered 2022-07-12: 2 mg via INTRAVENOUS

## 2022-07-12 MED ORDER — ONDANSETRON HCL 4 MG/2ML IJ SOLN: 4.0000 mg | Freq: Once | INTRAMUSCULAR | Status: DC | PRN

## 2022-07-12 MED ORDER — ONDANSETRON HCL 4 MG/2ML IJ SOLN
INTRAMUSCULAR | Status: DC | PRN
  Administered 2022-07-12: 4 mg via INTRAVENOUS

## 2022-07-12 MED ORDER — HYDROMORPHONE HCL 1 MG/ML IJ SOLN
0.2500 mg | INTRAMUSCULAR | Status: DC | PRN
  Administered 2022-07-12 (×2): 0.5 mg via INTRAVENOUS

## 2022-07-12 MED ORDER — KETOROLAC TROMETHAMINE 30 MG/ML IJ SOLN: 30.0000 mg | Freq: Once | INTRAMUSCULAR | Status: DC | PRN

## 2022-07-12 MED ORDER — KETOROLAC TROMETHAMINE 30 MG/ML IJ SOLN
INTRAMUSCULAR | Status: AC
  Filled 2022-07-12: qty 2

## 2022-07-12 MED ORDER — DEXAMETHASONE SODIUM PHOSPHATE 10 MG/ML IJ SOLN
INTRAMUSCULAR | Status: DC | PRN
  Administered 2022-07-12: 10 mg via INTRAVENOUS

## 2022-07-12 MED ORDER — LIDOCAINE 2% (20 MG/ML) 5 ML SYRINGE
INTRAMUSCULAR | Status: DC | PRN
  Administered 2022-07-12: 100 mg via INTRAVENOUS

## 2022-07-12 MED ORDER — SCOPOLAMINE 1 MG/3DAYS TD PT72
MEDICATED_PATCH | TRANSDERMAL | Status: AC
  Filled 2022-07-12: qty 1

## 2022-07-12 MED ORDER — HYDROMORPHONE HCL 1 MG/ML IJ SOLN
INTRAMUSCULAR | Status: AC
  Filled 2022-07-12: qty 1

## 2022-07-12 MED ORDER — KETOROLAC TROMETHAMINE 15 MG/ML IJ SOLN
15.0000 mg | INTRAMUSCULAR | Status: AC
  Administered 2022-07-12: 15 mg via INTRAVENOUS

## 2022-07-12 MED ORDER — OXYCODONE HCL 5 MG PO TABS
ORAL_TABLET | ORAL | Status: AC
  Filled 2022-07-12: qty 1

## 2022-07-12 MED ORDER — FENTANYL CITRATE (PF) 100 MCG/2ML IJ SOLN
INTRAMUSCULAR | Status: AC
  Filled 2022-07-12: qty 2

## 2022-07-12 MED ORDER — SODIUM CHLORIDE 0.9 % IR SOLN
Status: DC | PRN
  Administered 2022-07-12: 3000 mL

## 2022-07-12 MED ORDER — PROPOFOL 500 MG/50ML IV EMUL
INTRAVENOUS | Status: DC | PRN
  Administered 2022-07-12: 200 ug/kg/min via INTRAVENOUS

## 2022-07-12 MED ORDER — LACTATED RINGERS IV SOLN: INTRAVENOUS | Status: DC

## 2022-07-12 MED ORDER — ACETAMINOPHEN 500 MG PO TABS
1000.0000 mg | ORAL_TABLET | ORAL | Status: AC
  Administered 2022-07-12: 1000 mg via ORAL

## 2022-07-12 MED ORDER — PROPOFOL 500 MG/50ML IV EMUL
INTRAVENOUS | Status: AC
  Filled 2022-07-12: qty 50

## 2022-07-12 MED ORDER — PROPOFOL 10 MG/ML IV BOLUS
INTRAVENOUS | Status: AC
  Filled 2022-07-12: qty 20

## 2022-07-12 MED ORDER — PROPOFOL 10 MG/ML IV BOLUS
INTRAVENOUS | Status: DC | PRN
  Administered 2022-07-12: 20 mg via INTRAVENOUS
  Administered 2022-07-12: 180 mg via INTRAVENOUS

## 2022-07-12 MED ORDER — DEXAMETHASONE SODIUM PHOSPHATE 10 MG/ML IJ SOLN
INTRAMUSCULAR | Status: AC
  Filled 2022-07-12: qty 1

## 2022-07-12 MED ORDER — BUPIVACAINE HCL 0.25 % IJ SOLN
INTRAMUSCULAR | Status: DC | PRN
  Administered 2022-07-12: 10 mL

## 2022-07-12 MED ORDER — OXYCODONE HCL 5 MG/5ML PO SOLN: 5.0000 mg | Freq: Once | ORAL | Status: AC | PRN

## 2022-07-12 MED ORDER — ACETAMINOPHEN 500 MG PO TABS
ORAL_TABLET | ORAL | Status: AC
  Filled 2022-07-12: qty 2

## 2022-07-12 MED ORDER — DEXMEDETOMIDINE HCL IN NACL 80 MCG/20ML IV SOLN
INTRAVENOUS | Status: DC | PRN
  Administered 2022-07-12: 8 ug via BUCCAL

## 2022-07-12 MED ORDER — MIDAZOLAM HCL 2 MG/2ML IJ SOLN
INTRAMUSCULAR | Status: AC
  Filled 2022-07-12: qty 2

## 2022-07-12 SURGICAL SUPPLY — 15 items
ABLATOR SURESOUND NOVASURE (ABLATOR) ×1 IMPLANT
CATH ROBINSON RED A/P 16FR (CATHETERS) ×1 IMPLANT
DRSG TELFA 3X8 NADH STRL (GAUZE/BANDAGES/DRESSINGS) ×1 IMPLANT
GAUZE 4X4 16PLY ~~LOC~~+RFID DBL (SPONGE) ×2 IMPLANT
GLOVE BIO SURGEON STRL SZ7 (GLOVE) ×1 IMPLANT
GLOVE BIOGEL PI IND STRL 7.0 (GLOVE) ×1 IMPLANT
GOWN STRL REUS W/TWL LRG LVL3 (GOWN DISPOSABLE) ×1 IMPLANT
IV NS IRRIG 3000ML ARTHROMATIC (IV SOLUTION) ×1 IMPLANT
KIT PROCEDURE FLUENT (KITS) ×1 IMPLANT
KIT TURNOVER CYSTO (KITS) ×1 IMPLANT
PACK VAGINAL MINOR WOMEN LF (CUSTOM PROCEDURE TRAY) ×1 IMPLANT
PAD OB MATERNITY 4.3X12.25 (PERSONAL CARE ITEMS) ×1 IMPLANT
SEAL ROD LENS SCOPE MYOSURE (ABLATOR) ×1 IMPLANT
SLEEVE SCD COMPRESS KNEE MED (STOCKING) ×1 IMPLANT
TOWEL OR 17X24 6PK STRL BLUE (TOWEL DISPOSABLE) ×1 IMPLANT

## 2022-07-12 NOTE — Anesthesia Procedure Notes (Signed)
Procedure Name: LMA Insertion Date/Time: 07/12/2022 2:07 PM  Performed by: Rogers Blocker, CRNAPre-anesthesia Checklist: Patient identified, Emergency Drugs available, Suction available and Patient being monitored Patient Re-evaluated:Patient Re-evaluated prior to induction Oxygen Delivery Method: Circle System Utilized Preoxygenation: Pre-oxygenation with 100% oxygen Induction Type: IV induction Ventilation: Mask ventilation without difficulty LMA: LMA inserted LMA Size: 4.0 Number of attempts: 1 Airway Equipment and Method: Bite block Placement Confirmation: positive ETCO2 Tube secured with: Tape Dental Injury: Teeth and Oropharynx as per pre-operative assessment

## 2022-07-12 NOTE — Brief Op Note (Signed)
07/12/2022  2:33 PM  PATIENT:  [name]  47 y.o. female  PRE-OPERATIVE DIAGNOSIS:  ABNORMAL UTERINE BLEEDING  POST-OPERATIVE DIAGNOSIS:  ABNORMAL UTERINE BLEEDING  PROCEDURE:  Procedure(s): DILATATION & CURETTAGE/HYSTEROSCOPY WITH NOVASURE ABLATION (N/A)  SURGEON:  Surgeon(s) and Role:    * Lavonia Drafts, MD - Primary  ANESTHESIA:   general and paracervical block  EBL:  minimal   BLOOD ADMINISTERED:none  DRAINS: none   LOCAL MEDICATIONS USED:  MARCAINE     SPECIMEN:  Source of Specimen:  endometrial curetting.   DISPOSITION OF SPECIMEN:  PATHOLOGY  COUNTS:  YES  TOURNIQUET:  * No tourniquets in log *  DICTATION: .Note written in [place]: Discharge to home after PACU  PATIENT DISPOSITION:  PACU - hemodynamically stable.   Delay start of Pharmacological VTE agent (>24hrs) due to surgical blood loss or risk of bleeding: not applicable  Complications: none immediate  Heaven Meeker L. Harraway-Smith, M.D., Cherlynn June

## 2022-07-12 NOTE — Anesthesia Postprocedure Evaluation (Signed)
Anesthesia Post Note  Patient: Gabriella Snyder  Procedure(s) Performed: DILATATION & CURETTAGE/HYSTEROSCOPY WITH NOVASURE ABLATION (Uterus)     Patient location during evaluation: PACU Anesthesia Type: General Level of consciousness: awake Pain management: satisfactory to patient Vital Signs Assessment: post-procedure vital signs reviewed and stable Respiratory status: spontaneous breathing Cardiovascular status: blood pressure returned to baseline and stable Postop Assessment: adequate PO intake, no apparent nausea or vomiting and able to ambulate Anesthetic complications: no  No notable events documented.  Last Vitals:  Vitals:   07/12/22 1533 07/12/22 1600  BP:  131/80  Pulse: (!) 57 60  Resp: 17 16  Temp: (!) 36.3 C   SpO2: 100% 97%    Last Pain:  Vitals:   07/12/22 1600  TempSrc:   PainSc: 3                  Barnet Glasgow

## 2022-07-12 NOTE — Transfer of Care (Signed)
Immediate Anesthesia Transfer of Care Note  Patient: Gabriella Snyder  Procedure(s) Performed: DILATATION & CURETTAGE/HYSTEROSCOPY WITH NOVASURE ABLATION (Uterus)  Patient Location: PACU  Anesthesia Type:General  Level of Consciousness: awake, alert , and oriented  Airway & Oxygen Therapy: Patient Spontanous Breathing and Patient connected to nasal cannula oxygen  Post-op Assessment: Report given to RN and Post -op Vital signs reviewed and stable  Post vital signs: Reviewed and stable  Last Vitals:  Vitals Value Taken Time  BP    Temp    Pulse 82 07/12/22 1440  Resp    SpO2 99 % 07/12/22 1440  Vitals shown include unvalidated device data.  Last Pain:  Vitals:   07/12/22 1256  TempSrc: Oral  PainSc: 2       Patients Stated Pain Goal: 3 ([id number])  Complications: No notable events documented.

## 2022-07-12 NOTE — Op Note (Signed)
07/12/2022  2:33 PM  PATIENT:  Gabriella Snyder  47 y.o. female  PRE-OPERATIVE DIAGNOSIS:  ABNORMAL UTERINE BLEEDING  POST-OPERATIVE DIAGNOSIS:  ABNORMAL UTERINE BLEEDING  PROCEDURE:  Procedure(s): DILATATION & CURETTAGE/HYSTEROSCOPY WITH NOVASURE ABLATION (N/A)  SURGEON:  Surgeon(s) and Role:    * Lavonia Drafts, MD - Primary  ANESTHESIA:   general and paracervical block  EBL:  minimal   BLOOD ADMINISTERED:none  DRAINS: none   LOCAL MEDICATIONS USED:  MARCAINE     SPECIMEN:  Source of Specimen:  endometrial curetting.   DISPOSITION OF SPECIMEN:  PATHOLOGY  COUNTS:  YES  TOURNIQUET:  * No tourniquets in log *  DICTATION: .Note written in [place]: Discharge to home after PACU  PATIENT DISPOSITION:  PACU - hemodynamically stable.   Delay start of Pharmacological VTE agent (>24hrs) due to surgical blood loss or risk of bleeding: not applicable  Complications: none immediate  The risks, benefits, and alternatives of surgery were explained, understood, and accepted. The consents were signed and all questions were answered. She was taken to the operating room and general anesthesia was applied without complication. She was placed in the dorsal lithotomy position and her vagina and abdomen were prepped and draped in the usual sterile fashion. A bimanual exam revealed a normal size and shape anteverted mobile uterus. Her adnexa were non-enlarged.   A bivalved speculum was placed in the patients' vagina and the anterior lip of the cervix was grasped with a single toothed tenaculum. A paracervical block was performed at 5 and 7 o'clock with 20cc of 0.25% Marcaine.   The endometrial cavity was sounded to 10.5cm and the endocervical length measured 4cm. A hysteroscope was inserted and the endometrium was noted to be thickened with several polyps.  The ostia on both sides were noted.  The scope was removed and a sharp currete was used to scape the lining of the  uterus until a gritty texture was noted throughout.  Specimens were sent to pathology.  The NovaSure device was then inserted and seated using 6.5cm as the cavity length and 4cm as the cavity width.  The total activation time was 118 sec at a power of 143.  The hysteroscope was reinserted and an even burn pattern was noted to the fundus.  The single toothed tenaculum was removed at the end of the case and no bleeding was noted from the cervix.   The patient was extubated and taken to the recovery room in stable condition.  Sponge, lap and instrument counts were correct.  There were no complications.   Gabriella Snyder, M.D., Gabriella Snyder

## 2022-07-12 NOTE — H&P (Signed)
Preoperative History and Physical  Gabriella Snyder is a 47 y.o. [id number] here for surgical management of abnormal uterine bleeding.   Proposed surgery: hysteroscopy with dilation and curettage and endometrial ablation using Novasure.    Past Medical History:  Diagnosis Date   Anemia    Anxiety    Chronic pain    Degenerative disc disease, cervical    Depression    Heart murmur    "when I was younger; it closed up"   History of blood transfusion 1993   "related to the C-section   Migraine    "a couple times/yr" (02/25/2014)   Muscle spasm    PONV (postoperative nausea and vomiting)    Pre-diabetes    Pyelonephritis 02/25/2014   Past Surgical History:  Procedure Laterality Date   [place] OF UTERUS  2003   OPEN REDUCTION INTERNAL FIXATION (ORIF) METACARPAL Right 12/12/2019   Procedure: OPEN REDUCTION INTERNAL FIXATION (ORIF) MIDDLE AND DISTAL PHALANX FRACTURE;  Surgeon: Leanora Cover, MD;  Location: [place];  Service: Orthopedics;  Laterality: Right;   TUBAL LIGATION  2011   TUMOR EXCISION Right ~ 2004   "off my back"   OB History     Gravida  8   Para  5   Term  5   Preterm  0   AB  2   Living  5      SAB  1   IAB  0   Ectopic  0   Multiple  0   Live Births             Patient denies any cervical dysplasia or STIs. Medications Prior to Admission  Medication Sig Dispense Refill Last Dose   cyanocobalamin (VITAMIN B12) 1000 MCG tablet Take 1 tablet by mouth daily.   06/30/2022   Drospirenone (SLYND) 4 MG TABS Take 1 tablet by mouth daily at 12 noon. 90 tablet 1 07/11/2022   ergocalciferol (VITAMIN D2) 1.25 MG (50000 UT) capsule Take 50,000 Units by mouth once a week.   Past Week   methocarbamol (ROBAXIN) 750 MG tablet Take 750 mg by mouth 3 (three) times daily.   Past Week   Multiple Vitamin (MULTIVITAMIN) tablet Take 1 tablet by mouth daily.   06/30/2022   oxyCODONE (ROXICODONE) 15 MG immediate  release tablet Take 15 mg by mouth 4 (four) times daily as needed.   07/12/2022   predniSONE (DELTASONE) 50 MG tablet 1 p.o. daily x 5 5 tablet 0 06/30/2022   ascorbic acid (VITAMIN C) 500 MG tablet Take 500 mg by mouth daily.      diazepam (VALIUM) 2 MG tablet Take 1 tablet (2 mg total) by mouth every 6 (six) hours as needed for muscle spasms. 15 tablet 0    ibuprofen (ADVIL) 800 MG tablet Take 800 mg by mouth every 8 (eight) hours as needed for mild pain or moderate pain.   Unknown   lidocaine (LIDODERM) 5 % Place 1 patch onto the skin daily. Remove & Discard patch within 12 hours or as directed by MD 30 patch 0     No Known Allergies Social History:   reports that she has been smoking cigarettes. She has a 1.50 pack-year smoking history. She has never used smokeless tobacco. She reports that she does not currently use alcohol. She reports current drug use. Drug: Marijuana. Family History  Problem Relation Age of Onset   Diabetes Mother    Hypertension Father  Cancer Maternal Grandfather    Stroke Paternal Grandfather    Cancer Maternal Aunt        cervical   Cancer Cousin        lymphoma    Review of Systems: Noncontributory  PHYSICAL EXAM: Blood pressure (!) 158/85, pulse 85, temperature (!) 97.4 F (36.3 C), temperature source Oral, resp. rate 17, height '5\' 10"'$  (1.778 m), weight 116.1 kg, last menstrual period 07/12/2022, SpO2 99 %. General appearance - alert, well appearing, and in no distress Chest - clear to auscultation, no wheezes, rales or rhonchi, symmetric air entry Heart - normal rate and regular rhythm Abdomen - soft, nontender, nondistended, no masses or organomegaly Pelvic - completed in office  Extremities - peripheral pulses normal, no pedal edema, no clubbing or cyanosis  Labs: Results for orders placed or performed during the hospital encounter of 07/12/22 (from the past 336 hour(s))  Pregnancy, urine POC   Collection Time: 07/12/22 12:38 PM  Result Value Ref  Range   Preg Test, Ur NEGATIVE NEGATIVE    Imaging Studies: MM 3D SCREEN BREAST BILATERAL  Result Date: 07/01/2022 CLINICAL DATA:  Screening. EXAM: DIGITAL SCREENING BILATERAL MAMMOGRAM WITH TOMOSYNTHESIS AND CAD TECHNIQUE: Bilateral screening digital craniocaudal and mediolateral oblique mammograms were obtained. Bilateral screening digital breast tomosynthesis was performed. The images were evaluated with computer-aided detection. COMPARISON:  Previous exam(s). ACR Breast Density Category c: The breasts are heterogeneously dense, which may obscure small masses. FINDINGS: There are no findings suspicious for malignancy. IMPRESSION: No mammographic evidence of malignancy. A result letter of this screening mammogram will be mailed directly to the patient. RECOMMENDATION: Screening mammogram in one year. (Code:SM-B-01Y) BI-RADS CATEGORY  1: Negative. Electronically Signed   By: Beryle Flock M.D.   On: 07/01/2022 15:50    Assessment: Patient Active Problem List   Diagnosis Date Noted   Trichomonal vaginitis 12/26/2017   Morbid obesity ([place]) 02/17/2017   DUB (dysfunctional uterine bleeding) 02/17/2017   Yeast vaginitis 02/17/2017   BV (bacterial vaginosis) 02/27/2014   Iron deficiency anemia 02/27/2014   Pyelonephritis 02/25/2014   Acute pyelonephritis 02/25/2014    Plan: Patient will undergo surgical management with hysteroscopy with dilation and curettage and endometrial ablation using Novasure.  The risks of surgery were discussed in detail with the patient including but not limited to: bleeding which may require transfusion or reoperation; infection which may require antibiotics; injury to surrounding organs which may involve bowel, bladder, ureters ; need for additional procedures including laparoscopy or laparotomy; thromboembolic phenomenon, surgical site problems and other postoperative/anesthesia complications. Likelihood of success in alleviating the patient's condition was  discussed. Routine postoperative instructions will be reviewed with the patient and her family in detail after surgery.  The patient concurred with the proposed plan, giving informed written consent for the surgery.  Patient has been NPO since last night she will remain NPO for procedure.  Anesthesia and OR aware.  Preoperative prophylactic antibiotics and SCDs ordered on call to the OR.  To OR when ready.  Preethi Scantlebury L. Ihor Dow, M.D., [facility] 07/12/2022 1:24 PM

## 2022-07-12 NOTE — Discharge Instructions (Signed)

## 2022-07-13 ENCOUNTER — Encounter (HOSPITAL_BASED_OUTPATIENT_CLINIC_OR_DEPARTMENT_OTHER): Payer: Self-pay | Admitting: Obstetrics & Gynecology

## 2022-07-13 LAB — SURGICAL PATHOLOGY

## 2022-08-08 ENCOUNTER — Encounter: Payer: BC Managed Care – PPO | Admitting: Obstetrics and Gynecology

## 2022-08-10 ENCOUNTER — Encounter: Payer: Self-pay | Admitting: Obstetrics & Gynecology

## 2022-08-10 ENCOUNTER — Ambulatory Visit (INDEPENDENT_AMBULATORY_CARE_PROVIDER_SITE_OTHER): Payer: BC Managed Care – PPO | Admitting: Obstetrics & Gynecology

## 2022-08-10 VITALS — BP 130/76 | HR 66 | Ht 70.5 in | Wt 261.0 lb

## 2022-08-10 DIAGNOSIS — Z9889 Other specified postprocedural states: Secondary | ICD-10-CM | POA: Diagnosis not present

## 2022-08-10 DIAGNOSIS — N939 Abnormal uterine and vaginal bleeding, unspecified: Secondary | ICD-10-CM

## 2022-08-10 NOTE — Progress Notes (Signed)
Pt presents for post op f/u. Pt reports still spotting since surgery. Pt using 1-2 panty liners.

## 2022-08-10 NOTE — Progress Notes (Signed)
History:  47 y.o.LMP here today for 2 week post op check.Pt is s/p DILATATION & CURETTAGE/HYSTEROSCOPY WITH NOVASURE ABLATION (N/A) on 07/09/2022.  Pt reports that she is doing well. She is eating and passing stools without difficulty.   Pt reports improved bleeding from prior to the procedure.   The following portions of the patient's history were reviewed and updated as appropriate: allergies, current medications, past family history, past medical history, past social history, past surgical history and problem list.  Review of Systems:  Pertinent items are noted in HPI.    Objective:  Physical Exam BP 130/76   Pulse 66   Ht 5' 10.5" (1.791 m)   Wt 261 lb (118.4 kg)   BMI 36.92 kg/m   CONSTITUTIONAL: Well-developed, well-nourished female in no acute distress.  HENT:  Normocephalic, atraumatic EYES: Conjunctivae and EOM are normal. No scleral icterus.  NECK: Normal range of motion SKIN: Skin is warm and dry. No rash noted. Not diaphoretic.No pallor. NEUROLGIC: Alert and oriented to person, place, and time. Normal coordination.  Abd: Soft, nontender and nondistended; her port sites are healing well. .  Pelvic: deferred  Labs and Imaging Surg path3/09/2022  FINAL MICROSCOPIC DIAGNOSIS:   A. ENDOMETRIUM, CURETTAGE:  Inactive endometrium.  Negative for atypia, hyperplasia or malignancy.   Assessment & Plan:  4 week post op check following DILATATION & CURETTAGE/HYSTEROSCOPY WITH NOVASURE ABLATION doing well    Reviewed her surg path.   Reviewed post op instructions and activities  Gradual increase in activities  All questions answered.   Jabori Henegar L. Harraway-Smith, M.D., [facility]

## 2022-08-21 ENCOUNTER — Encounter: Payer: Self-pay | Admitting: Obstetrics & Gynecology

## 2022-09-15 ENCOUNTER — Encounter: Payer: Self-pay | Admitting: Obstetrics & Gynecology

## 2022-09-28 ENCOUNTER — Telehealth: Payer: Self-pay | Admitting: Obstetrics & Gynecology

## 2022-09-28 ENCOUNTER — Other Ambulatory Visit: Payer: Self-pay | Admitting: Obstetrics & Gynecology

## 2022-09-28 DIAGNOSIS — N8003 Adenomyosis of the uterus: Secondary | ICD-10-CM

## 2022-09-28 MED ORDER — MEGESTROL ACETATE 40 MG PO TABS: 40.0000 mg | ORAL_TABLET | Freq: Two times a day (BID) | ORAL | 3 refills | Status: DC

## 2022-10-03 NOTE — Telephone Encounter (Signed)
TC to pt. LMTCB.  Jaray Boliver L. Harraway-Smith, M.D., FACOG  

## 2022-10-16 ENCOUNTER — Emergency Department (HOSPITAL_COMMUNITY): Payer: BC Managed Care – PPO

## 2022-10-16 ENCOUNTER — Encounter (HOSPITAL_COMMUNITY): Payer: Self-pay

## 2022-10-16 ENCOUNTER — Emergency Department (HOSPITAL_COMMUNITY)
Admission: EM | Admit: 2022-10-16 | Discharge: 2022-10-16 | Disposition: A | Discharge status: Home / Self Care | Payer: BC Managed Care – PPO | Attending: Emergency Medicine | Admitting: Emergency Medicine

## 2022-10-16 ENCOUNTER — Other Ambulatory Visit: Payer: Self-pay

## 2022-10-16 DIAGNOSIS — R102 Pelvic and perineal pain: Secondary | ICD-10-CM | POA: Insufficient documentation

## 2022-10-16 DIAGNOSIS — R9389 Abnormal findings on diagnostic imaging of other specified body structures: Secondary | ICD-10-CM

## 2022-10-16 DIAGNOSIS — D219 Benign neoplasm of connective and other soft tissue, unspecified: Secondary | ICD-10-CM

## 2022-10-16 LAB — CBC WITH DIFFERENTIAL/PLATELET
Abs Immature Granulocytes: 0.06 10*3/uL (ref 0.00–0.07)
Basophils Absolute: 0.1 10*3/uL (ref 0.0–0.1)
Basophils Relative: 0 %
Eosinophils Absolute: 0.2 10*3/uL (ref 0.0–0.5)
Eosinophils Relative: 1 %
HCT: 38.5 % (ref 36.0–46.0)
Hemoglobin: 12.5 g/dL (ref 12.0–15.0)
Immature Granulocytes: 0 %
Lymphocytes Relative: 34 %
Lymphs Abs: 4.6 10*3/uL — ABNORMAL HIGH (ref 0.7–4.0)
MCH: 25.9 pg — ABNORMAL LOW (ref 26.0–34.0)
MCHC: 32.5 g/dL (ref 30.0–36.0)
MCV: 79.9 fL — ABNORMAL LOW (ref 80.0–100.0)
Monocytes Absolute: 0.6 10*3/uL (ref 0.1–1.0)
Monocytes Relative: 5 %
Neutro Abs: 8 10*3/uL — ABNORMAL HIGH (ref 1.7–7.7)
Neutrophils Relative %: 60 %
Platelets: 261 10*3/uL (ref 150–400)
RBC: 4.82 MIL/uL (ref 3.87–5.11)
RDW: 14.3 % (ref 11.5–15.5)
WBC: 13.6 10*3/uL — ABNORMAL HIGH (ref 4.0–10.5)
nRBC: 0 % (ref 0.0–0.2)

## 2022-10-16 LAB — URINALYSIS, ROUTINE W REFLEX MICROSCOPIC
Bilirubin Urine: NEGATIVE
Glucose, UA: NEGATIVE mg/dL
Ketones, ur: NEGATIVE mg/dL
Nitrite: NEGATIVE
Protein, ur: 30 mg/dL — AB
Specific Gravity, Urine: 1.034 — ABNORMAL HIGH (ref 1.005–1.030)
pH: 5 (ref 5.0–8.0)

## 2022-10-16 LAB — COMPREHENSIVE METABOLIC PANEL
ALT: 12 U/L (ref 0–44)
AST: 15 U/L (ref 15–41)
Albumin: 3.8 g/dL (ref 3.5–5.0)
Alkaline Phosphatase: 58 U/L (ref 38–126)
Anion gap: 8 (ref 5–15)
BUN: 13 mg/dL (ref 6–20)
CO2: 19 mmol/L — ABNORMAL LOW (ref 22–32)
Calcium: 8.9 mg/dL (ref 8.9–10.3)
Chloride: 109 mmol/L (ref 98–111)
Creatinine, Ser: 0.97 mg/dL (ref 0.44–1.00)
GFR, Estimated: >60 mL/min (ref >60)
Glucose, Bld: 111 mg/dL — ABNORMAL HIGH (ref 70–99)
Potassium: 3.7 mmol/L (ref 3.5–5.1)
Sodium: 136 mmol/L (ref 135–145)
Total Bilirubin: 0.7 mg/dL (ref 0.3–1.2)
Total Protein: 7.7 g/dL (ref 6.5–8.1)

## 2022-10-16 LAB — PREGNANCY, URINE: Preg Test, Ur: NEGATIVE

## 2022-10-16 LAB — LIPASE, BLOOD: Lipase: 34 U/L (ref 11–51)

## 2022-10-16 LAB — I-STAT BETA HCG BLOOD, ED (MC, WL, AP ONLY): I-stat hCG, quantitative: <5 m[IU]/mL (ref <5)

## 2022-10-16 MED ORDER — ACETAMINOPHEN 500 MG PO TABS
1000.0000 mg | ORAL_TABLET | Freq: Once | ORAL | Status: AC
  Administered 2022-10-16: 1000 mg via ORAL
  Filled 2022-10-16: qty 2

## 2022-10-16 MED ORDER — MORPHINE SULFATE (PF) 4 MG/ML IV SOLN
4.0000 mg | Freq: Once | INTRAVENOUS | Status: AC
  Administered 2022-10-16: 4 mg via INTRAVENOUS
  Filled 2022-10-16: qty 1

## 2022-10-16 MED ORDER — PROMETHAZINE (PHENERGAN) 6.25MG IN NS 50ML IVPB
6.2500 mg | Freq: Four times a day (QID) | INTRAVENOUS | Status: DC | PRN
  Administered 2022-10-16: 6.25 mg via INTRAVENOUS
  Filled 2022-10-16: qty 6.25

## 2022-10-16 MED ORDER — SODIUM CHLORIDE 0.9 % IV BOLUS
1000.0000 mL | Freq: Once | INTRAVENOUS | Status: AC
  Administered 2022-10-16: 1000 mL via INTRAVENOUS

## 2022-10-16 MED ORDER — ONDANSETRON 4 MG PO TBDP: 4.0000 mg | ORAL_TABLET | Freq: Three times a day (TID) | ORAL | 0 refills | Status: DC | PRN

## 2022-10-16 MED ORDER — DOXYCYCLINE HYCLATE 100 MG PO CAPS: 100.0000 mg | ORAL_CAPSULE | Freq: Two times a day (BID) | ORAL | 0 refills | Status: AC

## 2022-10-16 MED ORDER — ONDANSETRON HCL 4 MG/2ML IJ SOLN
4.0000 mg | Freq: Once | INTRAMUSCULAR | Status: AC
  Administered 2022-10-16: 4 mg via INTRAVENOUS
  Filled 2022-10-16: qty 2

## 2022-10-16 MED ORDER — HYDROMORPHONE HCL 1 MG/ML IJ SOLN
1.0000 mg | Freq: Once | INTRAMUSCULAR | Status: AC
  Administered 2022-10-16: 1 mg via INTRAVENOUS
  Filled 2022-10-16: qty 1

## 2022-10-16 MED ORDER — NAPROXEN 500 MG PO TABS: 500.0000 mg | ORAL_TABLET | Freq: Two times a day (BID) | ORAL | 0 refills | Status: DC

## 2022-10-16 NOTE — Discharge Instructions (Addendum)
It was a pleasure taking care of you today.  As discussed, your ultrasound showed a thickened endometrium and a fibroid.  I am sending you home with antibiotics.  Take as prescribed and finish all antibiotics.  Please call your OB/GYN tomorrow to schedule an appointment for follow-up.  Continue to take your pain medication as needed for pain.  Return to the ER for any worsening symptoms.

## 2022-10-16 NOTE — ED Triage Notes (Signed)
Pt. Arrives POV with partner c/o pelvic pain. Pt. States that she had an ablation in March and has never stopped bleeding. Pt. Was prescribed a months worth of megestrol. She states her bleeding stopped today, but her pain has been significantly worse today.

## 2022-10-16 NOTE — ED Provider Notes (Signed)
Care assumed from Gabriella Sheer, PA-C at shift change pending pelvic ultrasound.  See his note for full HPI.  In short, patient is a 47 year old female who presents to the ED due to pelvic pain that has been ongoing since March.  History of uterine ablation in March 2024.  Admits to intermittent vaginal bleeding since ablation.  Denies concerns for STIs.  Plan from previous provider: If [place] negative>CT abdomen Physical Exam  BP 119/76   Pulse 69   Temp 98.2 F (36.8 C) (Oral)   Resp 18   Ht 5\' 11"  (1.803 m)   Wt 117.9 kg   SpO2 100%   BMI 36.26 kg/m   Physical Exam Vitals and nursing note reviewed.  Constitutional:      General: She is not in acute distress.    Appearance: She is not ill-appearing.  HENT:     Head: Normocephalic.  Eyes:     Pupils: Pupils are equal, round, and reactive to light.  Cardiovascular:     Rate and Rhythm: Normal rate and regular rhythm.     Pulses: Normal pulses.     Heart sounds: Normal heart sounds. No murmur heard.    No friction rub. No gallop.  Pulmonary:     Effort: Pulmonary effort is normal.     Breath sounds: Normal breath sounds.  Abdominal:     General: Abdomen is flat. There is no distension.     Palpations: Abdomen is soft.     Tenderness: There is abdominal tenderness. There is no guarding or rebound.     Comments: Suprapubic tenderness  Genitourinary:    Comments: Patient declined pelvic exam Musculoskeletal:        General: Normal range of motion.     Cervical back: Neck supple.  Skin:    General: Skin is warm and dry.  Neurological:     General: No focal deficit present.     Mental Status: She is alert.  Psychiatric:        Mood and Affect: Mood normal.        Behavior: Behavior normal.     Procedures  Procedures  ED Course / MDM   Clinical Course as of 10/16/22 0813  Sun Oct 16, 2022  0634 WBC(!): 13.6 [CA]    Clinical Course User Index [CA] Mannie Stabile, PA-C   Medical Decision Making Amount  and/or Complexity of Data Reviewed Labs: ordered. Decision-making details documented in ED Course. Radiology: ordered and independent interpretation performed. Decision-making details documented in ED Course.  Risk OTC drugs. Prescription drug management.   Care assumed from Gabriella Sheer, PA-C at shift change pending pelvic ultrasound.  See his note for full MDM.  CBC significant for leukocytosis at 13.6.  Normal hemoglobin.  Lipase normal.  Low suspicion for pancreatitis.  Pregnancy test negative.  Low suspicion for ectopic pregnancy.  CMP significant for hyperglycemia 111.  No anion gap.  Doubt DKA.  Normal renal function.  UA significant for moderate hematuria likely due to vaginal bleeding.  Proteinuria.  Small leukocytes and rare bacteria.  Urine appears contaminated.  Patient denies any dysuria.  Low suspicion for acute cystitis.  8:13 AM reassessed patient at bedside.  Patient notes continued pain.  Patient given Tylenol and morphine prior to shift change.  IV Dilaudid given.  Patient also admits to ongoing nausea.  Patient previously given Zofran.  Phenergan given.  Awaiting pelvic ultrasound results.  [place] personally reviewed and interpreted which demonstrates a thickened endometrium and a fibroid.  Unclear etiology of thickened endometrium given recent ablation.  Will discuss with OB/GYN for further recommendations.  Discussed with Dr. Donavan Foil with OBGYN who recommends performing a pelvic exam to rule out other etiologies of pain. He also recommends starting patient on doxycycline 100mg  BID x5 days to cover for possible endometritis given recent procedure; however suspicion is lower. Follow-up with OBGYN for further evaluation.  8:05 AM reassessed patient at bedside.  Patient admits to significant pain improvement after Dilaudid.  Patient declined pelvic exam and prefers to follow-up with OB/GYN.  Patient agreeable to plan of doxycycline.  Will discharge patient with pain medication.   Follow-up with OB/GYN tomorrow or Tuesday for further evaluation. Strict ED precautions discussed with patient. Patient states understanding and agrees to plan. Patient discharged home in no acute distress and stable vitals  Has PCP Lives at home       Jesusita Oka 10/16/22 0813    Tilden Fossa, MD 10/16/22 2241

## 2022-10-16 NOTE — ED Provider Notes (Signed)
[place] EMERGENCY DEPARTMENT AT [facility] Provider Note   CSN: [id number] Arrival date & time: 10/16/22  0214     History  Chief Complaint  Patient presents with   Pelvic Pain   HPI Gabriella Snyder is a 47 y.o. female with history of uterine ablation in March of this year, tubal ligation, and anemia presenting for pelvic pain.  States she has had pelvic pain since her procedure in March.  It is in the left lower quadrant.  Today she did more walking than usual and the pain was much worse.  It feels sharp and is nonradiating.  States she has had intermittent vaginal bleeding since the ablation as well.  It comes and goes.  Today her bleeding stopped but pain is much worse.  Denies fever.  Denies urinary or bowel changes.  Denies nausea vomiting diarrhea.   Pelvic Pain       Home Medications Prior to Admission medications   Medication Sig Start Date End Date Taking? Authorizing Provider  DULoxetine (CYMBALTA) 30 MG capsule Take 90 mg by mouth daily. 08/26/22  Yes [provider]  ergocalciferol (VITAMIN D2) 1.25 MG (50000 UT) capsule Take 50,000 Units by mouth once a week.   Yes [provider]  ibuprofen (ADVIL) 400 MG tablet Take 400 mg by mouth every 6 (six) hours as needed for moderate pain.   Yes [provider]  megestrol (MEGACE) 40 MG tablet Take 1 tablet (40 mg total) by mouth 2 (two) times daily. Can increase to two tablets twice a day in the event of heavy bleeding 09/28/22  Yes Harraway-Smith, Eber Jones, MD  oxyCODONE (ROXICODONE) 15 MG immediate release tablet Take 15 mg by mouth 4 (four) times daily as needed for pain. 03/04/22  Yes [provider]  diazepam (VALIUM) 2 MG tablet Take 1 tablet (2 mg total) by mouth every 6 (six) hours as needed for muscle spasms. Patient not taking: Reported on 10/16/2022 06/27/22   Lorre Nick, MD  lidocaine (LIDODERM) 5 % Place 1 patch onto the skin daily. Remove & Discard patch within 12  hours or as directed by MD Patient not taking: Reported on 10/16/2022 06/27/22   Lorre Nick, MD  methocarbamol (ROBAXIN) 750 MG tablet Take 750 mg by mouth every 8 (eight) hours as needed for muscle spasms.    [provider]  Multiple Vitamin (MULTIVITAMIN) tablet Take 1 tablet by mouth daily.    [provider]  predniSONE (DELTASONE) 50 MG tablet 1 p.o. daily x 5 Patient not taking: Reported on 08/10/2022 06/27/22   Lorre Nick, MD      Allergies    Patient has no known allergies.    Review of Systems   Review of Systems  Genitourinary:  Positive for pelvic pain.    Physical Exam   Vitals:   10/16/22 0521 10/16/22 0627  BP:    Pulse: 63 63  Resp:    Temp:    SpO2: 100% 99%    CONSTITUTIONAL:  well-appearing, NAD NEURO:  Alert and oriented x 3, CN 3-12 grossly intact EYES:  eyes equal and reactive ENT/NECK:  Supple, no stridor  CARDIO:  regular rate and rhythm, appears well-perfused  PULM:  No respiratory distress, CTAB GI/GU:  non-distended, soft, LLQ tenderness MSK/SPINE:  No gross deformities, no edema, moves all extremities  SKIN:  no rash, atraumatic   *Additional and/or pertinent findings included in MDM below    ED Results / Procedures / Treatments  Labs (all labs ordered are listed, but only abnormal results are displayed) Labs Reviewed  URINALYSIS, ROUTINE W REFLEX MICROSCOPIC - Abnormal; Notable for the following components:      Result Value   APPearance HAZY (*)    Specific Gravity, Urine 1.034 (*)    Hgb urine dipstick MODERATE (*)    Protein, ur 30 (*)    Leukocytes,Ua SMALL (*)    Bacteria, UA RARE (*)    All other components within normal limits  CBC WITH DIFFERENTIAL/PLATELET - Abnormal; Notable for the following components:   WBC 13.6 (*)    MCV 79.9 (*)    MCH 25.9 (*)    Neutro Abs 8.0 (*)    Lymphs Abs 4.6 (*)    All other components within normal limits  COMPREHENSIVE METABOLIC PANEL - Abnormal; Notable for the  following components:   CO2 19 (*)    Glucose, Bld 111 (*)    All other components within normal limits  PREGNANCY, URINE  LIPASE, BLOOD  I-STAT BETA HCG BLOOD, ED ([facility], [facility], [facility] ONLY)    EKG None  Radiology No results found.  Procedures Procedures    Medications Ordered in ED Medications  HYDROmorphone (DILAUDID) injection 1 mg (has no administration in time range)  promethazine (PHENERGAN) 6.25 mg/NS 50 mL IVPB (has no administration in time range)  acetaminophen (TYLENOL) tablet 1,000 mg (1,000 mg Oral Given 10/16/22 0229)  sodium chloride 0.9 % bolus 1,000 mL (1,000 mLs Intravenous New Bag/Given 10/16/22 0451)  morphine (PF) 4 MG/ML injection 4 mg (4 mg Intravenous Given 10/16/22 0452)  ondansetron (ZOFRAN) injection 4 mg (4 mg Intravenous Given 10/16/22 0451)    ED Course/ Medical Decision Making/ A&P Clinical Course as of 10/16/22 0703  Sun Oct 16, 2022  0634 WBC(!): 13.6 [CA]    Clinical Course User Index [CA] Mannie Stabile, PA-C                             Medical Decision Making Amount and/or Complexity of Data Reviewed Labs: ordered. Decision-making details documented in ED Course. Radiology: ordered.  Risk OTC drugs. Prescription drug management.   47 yo well-appearing female presenting for pelvic pain.  Exam notable for left lower quadrant tenderness but otherwise reassuring. DDx includes ectopic pregnancy, ovarian torsion, PID, TOA, diverticulitis, nephrolithiasis pyelonephritis, appendicitis.  Initial concern is ovarian torsion which prompted pelvic ultrasound.  Study is pending.  Ectopic pregnancy unlikely given negative pregnancy test. On reassessment patient stated her pain was improved after treatment.  Signed out patient to PA Claudette Stapler who will continue to manage. Plan will be to follow-up on ultrasound, if negative will likely necessitate CT scan to evaluate for intra-abdominal pathology.        Final Clinical Impression(s) / ED  Diagnoses Final diagnoses:  Pelvic pain in female    Rx / DC Orders ED Discharge Orders     None         Gareth Eagle, PA-C 10/16/22 0703    Tilden Fossa, MD 10/16/22 2241

## 2022-10-17 ENCOUNTER — Ambulatory Visit (INDEPENDENT_AMBULATORY_CARE_PROVIDER_SITE_OTHER): Payer: BC Managed Care – PPO | Admitting: Obstetrics & Gynecology

## 2022-10-17 ENCOUNTER — Telehealth: Payer: Self-pay

## 2022-10-17 ENCOUNTER — Encounter: Payer: Self-pay | Admitting: Obstetrics & Gynecology

## 2022-10-17 VITALS — BP 131/84 | HR 78 | Ht 71.0 in | Wt 258.0 lb

## 2022-10-17 DIAGNOSIS — R102 Pelvic and perineal pain: Secondary | ICD-10-CM

## 2022-10-17 DIAGNOSIS — N921 Excessive and frequent menstruation with irregular cycle: Secondary | ICD-10-CM | POA: Diagnosis not present

## 2022-10-17 NOTE — Progress Notes (Signed)
   Subjective:    Patient ID: Gabriella Snyder, Gabriella Snyder    DOB: 03/17/1976, 47 y.o.   MRN: [id number]  HPI  47 yo Gabriella Snyder presents with pelvic pain and bleeding.  Pt had D & C, hysteroscopy and Novasure ablation in March with Dr. Erin Fulling.  She has been miserable with pain and bleeding and had ED visit yesterday.  Pt interested in hysterectomy and would like to change physicians.    Review of Systems  Constitutional: Negative.   Respiratory: Negative.    Cardiovascular: Negative.   Gastrointestinal: Negative.   Genitourinary:  Positive for pelvic pain and vaginal bleeding.       Objective:   Physical Exam Vitals reviewed.  Constitutional:      General: She is not in acute distress.    Appearance: She is well-developed.  HENT:     Head: Normocephalic and atraumatic.  Eyes:     Conjunctiva/sclera: Conjunctivae normal.  Cardiovascular:     Rate and Rhythm: Normal rate.  Pulmonary:     Effort: Pulmonary effort is normal.  Abdominal:     General: Abdomen is flat.     Palpations: Abdomen is soft.  Genitourinary:    Comments: [place] V Vulva:  No lesion Vagina:  Pink, no lesions, no discharge, + trace blood Cervix:  No CMT, no lesion Uterus:  Tender, mobile Right adnexa--non tender, no mass Left adnexa--non tender, no mass Skin:    General: Skin is warm and dry.  Neurological:     Mental Status: She is alert and oriented to person, place, and time.  Psychiatric:        Mood and Affect: Mood normal.    Vitals:   10/17/22 1600  BP: 131/84  Pulse: 78  Weight: 258 lb (117 kg)  Height: 5\' 11"  (1.803 m)      Assessment & Plan:  47 yo Gabriella Snyder with vaginal bleeding and pain after failed Novasure ablation.   Appt ASAP with Dr. Berton Lan or Dr. Para March for hysterectomy consultation Megace and NSAIDS to control bleeding and pain.   28 mins spent during encounter includeing review of operative note, pathology, labs, ED ntoes, clinic notes, H & P, counseling and  documentation.

## 2022-10-17 NOTE — Transitions of Care (Post Inpatient/ED Visit) (Signed)
10/17/2022  Name: Gabriella Snyder MRN: [id number] DOB: June 30, 1975  Today's TOC FU Call Status: Today's TOC FU Call Status:: Successful TOC FU Call Competed TOC FU Call Complete Date: 10/16/22  Transition Care Management Follow-up Telephone Call Date of Discharge: 10/16/22 Discharge Facility: [facility]) Type of Discharge: Emergency Department Reason for ED Visit:  (Abdominal pain) How have you been since you were released from the hospital?: Better Any questions or concerns?: No  Items Reviewed: Did you receive and understand the discharge instructions provided?: Yes Medications obtained,verified, and reconciled?: Yes (Medications Reviewed) Any new allergies since your discharge?: No Dietary orders reviewed?: No Do you have support at home?: Yes People in Home: spouse, child(ren), adult  Medications Reviewed Today: Medications Reviewed Today     Reviewed by Raj Janus, CMA (Certified Medical Assistant) on 10/17/22 at 1034  Med List Status: <None>   Medication Order Taking? Sig Documenting Provider Last Dose Status Informant  diazepam (VALIUM) 2 MG tablet [id number] No Take 1 tablet (2 mg total) by mouth every 6 (six) hours as needed for muscle spasms.  Patient not taking: Reported on 10/16/2022   Lorre Nick, MD Not Taking Active   doxycycline (VIBRAMYCIN) 100 MG capsule [id number] Yes Take 1 capsule (100 mg total) by mouth 2 (two) times daily for 5 days. Mannie Stabile, PA-C Taking Active   DULoxetine (CYMBALTA) 30 MG capsule [id number] Yes Take 90 mg by mouth daily. [provider] Taking Active   ergocalciferol (VITAMIN D2) 1.25 MG (50000 UT) capsule [id number] Yes Take 50,000 Units by mouth once a week. [provider] Taking Active Self           Med Note Cyndie Chime, Merlyn Lot Oct 16, 2022  4:46 AM)    ibuprofen (ADVIL) 400 MG tablet [id number] Yes Take 400 mg by mouth every 6 (six) hours as needed for moderate pain.  [provider] Taking Active   lidocaine (LIDODERM) 5 % [id number] Yes Place 1 patch onto the skin daily. Remove & Discard patch within 12 hours or as directed by MD Lorre Nick, MD Taking Active   megestrol (MEGACE) 40 MG tablet [id number] No Take 1 tablet (40 mg total) by mouth 2 (two) times daily. Can increase to two tablets twice a day in the event of heavy bleeding  Patient not taking: Reported on 10/17/2022   Willodean Rosenthal, MD Not Taking Active   methocarbamol (ROBAXIN) 750 MG tablet [id number] No Take 750 mg by mouth every 8 (eight) hours as needed for muscle spasms.  Patient not taking: Reported on 10/17/2022   [provider] Not Taking Active            Med Note Cyndie Chime, Merlyn Lot Oct 16, 2022  4:49 AM)    Multiple Vitamin (MULTIVITAMIN) tablet [id number] Yes Take 1 tablet by mouth daily. [provider] Taking Active Self  naproxen (NAPROSYN) 500 MG tablet [id number] Yes Take 1 tablet (500 mg total) by mouth 2 (two) times daily. Mannie Stabile, PA-C Taking Active   ondansetron (ZOFRAN-ODT) 4 MG disintegrating tablet [id number] Yes Take 1 tablet (4 mg total) by mouth every 8 (eight) hours as needed for nausea or vomiting. Mannie Stabile, PA-C Taking Active   oxyCODONE (ROXICODONE) 15 MG immediate release tablet [id number] Yes Take 15 mg by mouth 4 (four) times daily as needed for pain. [provider] Taking Active            Med Note (  Renelda Loma   Tue May 10, 2022 10:08 AM) Prn   predniSONE (DELTASONE) 50 MG tablet [id number] No 1 p.o. daily x 5  Patient not taking: Reported on 08/10/2022   Lorre Nick, MD Not Taking Active             Home Care and Equipment/Supplies: Were Home Health Services Ordered?: No Any new equipment or medical supplies ordered?: No  Functional Questionnaire: Do you need assistance with bathing/showering or dressing?: No Do you need assistance with meal preparation?: No Do you need  assistance with eating?: No Do you have difficulty maintaining continence: No Do you need assistance with getting out of bed/getting out of a chair/moving?: No Do you have difficulty managing or taking your medications?: Yes  Follow up appointments reviewed: PCP Follow-up appointment confirmed?: Yes Date of PCP follow-up appointment?: 11/08/22 Follow-up Provider: Cresenciano Genre Follow-up appointment confirmed?: No Do you need transportation to your follow-up appointment?: No Do you understand care options if your condition(s) worsen?: Yes-patient verbalized understanding    SIGNATURE Gh

## 2022-10-18 ENCOUNTER — Telehealth: Payer: Self-pay

## 2022-10-18 NOTE — Telephone Encounter (Signed)
Pt had ablations 07-12-22. Pt returned to them 08/10/22 for a follow up still bleeding . Pt was told that was normal. Pt went to the er 10/16/22 and doesn't feel any better and she was advise that d=she should not be dx with endometriosis since she just had the ablation done. Pt stated that she is in so much pain and want your advise before return ER or the women health. Pt doesn't feel comfortable returning to the women health in [facility]

## 2022-10-18 NOTE — Telephone Encounter (Signed)
Sent message to [place] for info. KH

## 2022-10-18 NOTE — Telephone Encounter (Signed)
Called pt back to advise that [place] advised her to see if she could  follow up with another provider at the women center. If she does not want to to do this she will need to be seen for a referral. Pt was also advise if the pain continues and she is unable to get an appointment she can visit the nearest ER or UC. Pt stated she understood and has appointment already set up with another physician at the [facility].  Renelda Loma RMA

## 2022-10-19 ENCOUNTER — Telehealth: Payer: Self-pay | Admitting: *Deleted

## 2022-10-19 ENCOUNTER — Encounter: Payer: Self-pay | Admitting: Physician Assistant

## 2022-10-19 NOTE — Telephone Encounter (Signed)
Patient did not want to take appointment on 10/20/2022 with Dr. Berton Lan. Due to the pain, patient is going to [facility] ER for evaluation.

## 2022-10-20 ENCOUNTER — Telehealth: Payer: Self-pay

## 2022-10-20 NOTE — Transitions of Care (Post Inpatient/ED Visit) (Cosign Needed)
10/20/2022  Name: Gabriella Snyder MRN: [id number] DOB: [birth year]  Today's TOC FU Call Status: Today's TOC FU Call Status:: Successful TOC FU Call Competed TOC FU Call Complete Date: 10/20/22  Transition Care Management Follow-up Telephone Call Date of Discharge: 10/19/22 Discharge Facility: Other ([facility]) ([facility]) Type of Discharge: Emergency Department Reason for ED Visit: Other: How have you been since you were released from the hospital?: Worse Any questions or concerns?: No  Items Reviewed: Did you receive and understand the discharge instructions provided?: Yes Medications obtained,verified, and reconciled?: Yes (Medications Reviewed) Any new allergies since your discharge?: No Dietary orders reviewed?: NA Do you have support at home?: Yes People in Home: spouse, child(ren), adult  Medications Reviewed Today: Medications Reviewed Today     Reviewed by Renelda Loma, RMA (Registered Medical Assistant) on 10/20/22 at 1125  Med List Status: <None>   Medication Order Taking? Sig Documenting Provider Last Dose Status Informant  doxycycline (VIBRAMYCIN) 100 MG capsule [id number] Yes Take 1 capsule (100 mg total) by mouth 2 (two) times daily for 5 days. Mannie Stabile, PA-C Taking Active   DULoxetine (CYMBALTA) 30 MG capsule [id number] Yes Take 90 mg by mouth daily. [provider] Taking Active   ergocalciferol (VITAMIN D2) 1.25 MG (50000 UT) capsule [id number] Yes Take 50,000 Units by mouth once a week. [provider] Taking Active Self           Med Note Cyndie Chime, Merlyn Lot Oct 16, 2022  4:46 AM)    ibuprofen (ADVIL) 400 MG tablet [id number] Yes Take 400 mg by mouth every 6 (six) hours as needed for moderate pain. [provider] Taking Active   lidocaine (LIDODERM) 5 % [id number] Yes Place 1 patch onto the skin daily. Remove & Discard patch within 12 hours or as directed by MD Lorre Nick, MD Taking Active    megestrol (MEGACE) 40 MG tablet [id number] Yes Take 1 tablet (40 mg total) by mouth 2 (two) times daily. Can increase to two tablets twice a day in the event of heavy bleeding Willodean Rosenthal, MD Taking Active   Multiple Vitamin (MULTIVITAMIN) tablet [id number] Yes Take 1 tablet by mouth daily. [provider] Taking Active Self  naproxen (NAPROSYN) 500 MG tablet [id number] Yes Take 1 tablet (500 mg total) by mouth 2 (two) times daily. Mannie Stabile, PA-C Taking Active   ondansetron (ZOFRAN-ODT) 4 MG disintegrating tablet [id number] Yes Take 1 tablet (4 mg total) by mouth every 8 (eight) hours as needed for nausea or vomiting. Mannie Stabile, PA-C Taking Active   oxyCODONE (ROXICODONE) 15 MG immediate release tablet [id number] Yes Take 15 mg by mouth 4 (four) times daily as needed for pain. [provider] Taking Active            Med Note Sherilyn Cooter, [facility]   Tue May 10, 2022 10:08 AM) Prn             Home Care and Equipment/Supplies: Were Home Health Services Ordered?: NA Any new equipment or medical supplies ordered?: NA  Functional Questionnaire: Do you need assistance with bathing/showering or dressing?: No Do you need assistance with meal preparation?: No Do you need assistance with eating?: No Do you have difficulty maintaining continence: No Do you need assistance with getting out of bed/getting out of a chair/moving?: No Do you have difficulty managing or taking your medications?: No  Follow up appointments reviewed: PCP Follow-up appointment confirmed?: NA Specialist Hospital Follow-up appointment confirmed?: Yes  Follow-Up Specialty Provider:: pt is waiting on GYN to call from [place] Do you need transportation to your follow-up appointment?: No Do you understand care options if your condition(s) worsen?: Yes-patient verbalized understanding    SIGNATURE Renelda Loma RMA

## 2022-11-02 ENCOUNTER — Ambulatory Visit: Payer: BC Managed Care – PPO | Admitting: Obstetrics & Gynecology

## 2022-11-08 ENCOUNTER — Ambulatory Visit (INDEPENDENT_AMBULATORY_CARE_PROVIDER_SITE_OTHER): Payer: Medicaid Other | Admitting: Family Medicine

## 2022-11-08 VITALS — BP 127/77 | HR 79 | Temp 97.2°F | Wt 257.8 lb

## 2022-11-08 DIAGNOSIS — R7303 Prediabetes: Secondary | ICD-10-CM

## 2022-11-08 DIAGNOSIS — R631 Polydipsia: Secondary | ICD-10-CM | POA: Diagnosis not present

## 2022-11-08 DIAGNOSIS — R739 Hyperglycemia, unspecified: Secondary | ICD-10-CM | POA: Diagnosis not present

## 2022-11-08 DIAGNOSIS — E669 Obesity, unspecified: Secondary | ICD-10-CM | POA: Diagnosis not present

## 2022-11-08 LAB — POCT GLYCOSYLATED HEMOGLOBIN (HGB A1C): Hemoglobin A1C: 6.6 % — AB (ref 4.0–5.6)

## 2022-11-08 MED ORDER — METFORMIN HCL 500 MG PO TABS: 500.0000 mg | ORAL_TABLET | Freq: Every day | ORAL | 2 refills | Status: DC

## 2022-11-08 NOTE — Patient Instructions (Addendum)
Tumeric daily Castor oil (pure, bottle), rub on abdomen  Continue Megace   Metformin Tablets What is this medication? METFORMIN (met FOR min) treats type 2 diabetes. It controls blood sugar (glucose) and helps your body use insulin effectively. This medication is often combined with changes to diet and exercise. This medicine may be used for other purposes; ask your health care provider or pharmacist if you have questions. COMMON BRAND NAME(S): Glucophage What should I tell my care team before I take this medication? They need to know if you have any of these conditions: Anemia Dehydration Frequently drink alcohol Heart disease Kidney disease Liver disease Polycystic ovary syndrome Serious infection or injury Vomiting An unusual or allergic reaction to metformin, other medications, foods, dyes, or preservatives Pregnant or trying to get pregnant Breastfeeding How should I use this medication? Take this medication by mouth with a glass of water. Take it as directed on the prescription label at the same time every day. Take it with food. Keep taking it unless your care team tells you to stop. Talk to your care team about the use of this medication in children. While it may be prescribed for children as young as 30 years of age for selected conditions, precautions do apply. Overdosage: If you think you have taken too much of this medicine contact a poison control center or emergency room at once. NOTE: This medicine is only for you. Do not share this medicine with others. What if I miss a dose? If you miss a dose, take it as soon as you can. If it is almost time for your next dose, take only that dose. Do not take double or extra doses. What may interact with this medication? Do not take this medication with any of the following: Certain contrast medications given before X-rays, CT scans, MRI, or other procedures Dofetilide This medication may also interact with the  following: Acetazolamide Alcohol Certain antivirals for HIV or hepatitis Certain medications for blood pressure, heart disease, irregular heart beat Cimetidine Dichlorphenamide Digoxin Diuretics Estrogen or progestin hormones Glycopyrrolate Isoniazid Lamotrigine Memantine Methazolamide Metoclopramide Midodrine Niacin Phenothiazines, such as chlorpromazine, mesoridazine, prochlorperazine, thioridazine Phenytoin Ranolazine Steroid medications, such as prednisone or cortisone Stimulant medications for ADHD, weight loss, or staying awake Thyroid medications Topiramate Trospium Vandetanib Zonisamide This list may not describe all possible interactions. Give your health care provider a list of all the medicines, herbs, non-prescription drugs, or dietary supplements you use. Also tell them if you smoke, drink alcohol, or use illegal drugs. Some items may interact with your medicine. What should I watch for while using this medication? Visit your care team for regular checks on your progress. Tell your care team if your symptoms do not start to get better or if they get worse. A test called the HbA1C (A1C) will be monitored. This is a simple blood test. It measures your blood sugar control over the last 2 to 3 months. You will receive this test every 3 to 6 months. Using this medication with insulin or a sulfonylurea may increase your risk of hypoglycemia. Learn how to check your blood sugar. Learn the symptoms of low and high blood sugar and how to manage them. Always carry a quick-source of sugar with you in case you have symptoms of low blood sugar. Examples include hard sugar candy or glucose tablets. Make sure others know that you can choke if you eat or drink when you develop serious symptoms of low blood sugar, such as seizures or unconsciousness. They  must get medical help at once. Tell your care team if you have high blood sugar. You might need to change the dose of your  medication. If you are sick or exercising more than usual, you might need to change the dose of your medication. Do not skip meals. Ask your care team if you should avoid alcohol. Many nonprescription cough and cold products contain sugar or alcohol. These can affect blood sugar. This medication may cause you to ovulate, which may increase your chances of becoming pregnant. Talk with your care team about contraception while you are taking this medication. Contact your care team if you think you might be pregnant. If you are going to need surgery, an MRI, CT scan, or other procedure, tell your care team that you are taking this medication. You may need to stop taking this medication before the procedure. Wear a medical ID bracelet or chain. Carry a card that describes your condition. List the medications and doses you take on the card. This medication may cause a decrease in folic acid and vitamin B12. You should make sure that you get enough vitamins while you are taking this medication. Discuss the foods you eat and the vitamins you take with your care team. What side effects may I notice from receiving this medication? Side effects that you should report to your care team as soon as possible: Allergic reactions--skin rash, itching, hives, swelling of the face, lips, tongue, or throat High lactic acid level--muscle pain or cramps, stomach pain, trouble breathing, general discomfort or fatigue Low vitamin B12 level--pain, tingling, or numbness in the hands or feet, muscle weakness, dizziness, confusion, difficulty concentrating Side effects that usually do not require medical attention (report to your care team if they continue or are bothersome): Diarrhea Gas Headache Metallic taste in mouth Nausea This list may not describe all possible side effects. Call your doctor for medical advice about side effects. You may report side effects to FDA at 1-800-FDA-1088. Where should I keep my  medication? Keep out of the reach of children and pets. Store between 15 and 30 degrees C (59 and 86 degrees F). Protect from moisture and light. Get rid of any unused medication after the expiration date. To get rid of medications that are no longer needed or expired: Take the medication to a medication take-back program. Check with your pharmacy or law enforcement to find a location. If you cannot return the medication, check the label or package insert to see if the medication should be thrown out in the garbage or flushed down the toilet. If you are not sure, ask your care team. If it is safe to put in the trash, empty the medication out of the container. Mix the medication with cat litter, dirt, coffee grounds, or other unwanted substance. Seal the mixture in a bag or container. Put it in the trash. NOTE: This sheet is a summary. It may not cover all possible information. If you have questions about this medicine, talk to your doctor, pharmacist, or health care provider.  2024 Elsevier/Gold Standard (2021-11-22 00:00:00)

## 2022-11-08 NOTE — Progress Notes (Signed)
Subjective   Patient ID: Gabriella Snyder, female    DOB: [birth year]  Age: 47 y.o. MRN: [id number]  Chief Complaint  Patient presents with   Prediabetes    Gabriella Snyder is a very pleasant 47 year old female that presents for follow-up chronic conditions.  Patient has a history of prediabetes.  Last hemoglobin A1c was 5.8.  Patient was advised to follow-up carbohydrate modified diet over the past 6 months.  Patient states that she has not been exercising or following a carbohydrate modified diet.  Patient endorses increased thirst.  She denies increased hunger or frequent urination.  No blurred vision.  Patient has a history of dysfunctional uterine bleeding.  She has been in the emergency room on 3 occasions for heavy vaginal bleeding.  She was recently started on Megace and bleeding has been controlled.  Patient has been referred to a new gynecologist to establish care. Patient was most recently seen in the emergency department on 10/19/2022.    Patient Active Problem List   Diagnosis Date Noted   Morbid obesity (HCC) 02/17/2017   DUB (dysfunctional uterine bleeding) 02/17/2017   Iron deficiency anemia 02/27/2014   Past Medical History:  Diagnosis Date   Anemia    Anxiety    Chronic pain    Degenerative disc disease, cervical    Depression    Heart murmur    "when I was younger; it closed up"   History of blood transfusion 1993   "related to the C-section   Migraine    "a couple times/yr" (02/25/2014)   Muscle spasm    PONV (postoperative nausea and vomiting)    Pre-diabetes    Pyelonephritis 02/25/2014   Past Surgical History:  Procedure Laterality Date   CESAREAN SECTION  1993   DILATION AND CURETTAGE OF UTERUS  2003   DILITATION & CURRETTAGE/HYSTROSCOPY WITH NOVASURE ABLATION N/A 07/12/2022   Procedure: DILATATION & CURETTAGE/HYSTEROSCOPY WITH NOVASURE ABLATION;  Surgeon: Willodean Rosenthal, MD;  Location: [facility] [place];  Service: Gynecology;   Laterality: N/A;   OPEN REDUCTION INTERNAL FIXATION (ORIF) METACARPAL Right 12/12/2019   Procedure: OPEN REDUCTION INTERNAL FIXATION (ORIF) MIDDLE AND DISTAL PHALANX FRACTURE;  Surgeon: Betha Loa, MD;  Location: [facility];  Service: Orthopedics;  Laterality: Right;   TUBAL LIGATION  2011   TUMOR EXCISION Right ~ 2004   "off my back"   Social History   Tobacco Use   Smoking status: Every Day    Packs/day: 0.25    Years: 6.00    Additional pack years: 0.00    Total pack years: 1.50    Types: Cigarettes   Smokeless tobacco: Never  Vaping Use   Vaping Use: Never used  Substance Use Topics   Alcohol use: Not Currently   Drug use: Yes    Types: Marijuana   Social History   Socioeconomic History   Marital status: Married    Spouse name: Not on file   Number of children: Not on file   Years of education: Not on file   Highest education level: Not on file  Occupational History   Not on file  Tobacco Use   Smoking status: Every Day    Packs/day: 0.25    Years: 6.00    Additional pack years: 0.00    Total pack years: 1.50    Types: Cigarettes   Smokeless tobacco: Never  Vaping Use   Vaping Use: Never used  Substance and Sexual Activity   Alcohol use: Not Currently  Drug use: Yes    Types: Marijuana   Sexual activity: Yes    Birth control/protection: Pill  Other Topics Concern   Not on file  Social History Narrative   Not on file   Social Determinants of Health   Financial Resource Strain: Not on file  Food Insecurity: Not on file  Transportation Needs: Not on file  Physical Activity: Not on file  Stress: Not on file  Social Connections: Not on file  Intimate Partner Violence: Not on file   Family Status  Relation Name Status   Mother  Deceased   Father  Alive   MGF  (Not Specified)   PGF  (Not Specified)   Mat Aunt  (Not Specified)   Cousin  (Not Specified)   Family History  Problem Relation Age of Onset   Diabetes Mother     Hypertension Father    Cancer Maternal Grandfather    Stroke Paternal Grandfather    Cancer Maternal Aunt        cervical   Cancer Cousin        lymphoma   No Known Allergies    Review of Systems  Constitutional: Negative.   HENT: Negative.    Eyes: Negative.   Respiratory: Negative.    Cardiovascular: Negative.   Gastrointestinal: Negative.   Genitourinary: Negative.   Musculoskeletal: Negative.   Skin: Negative.   Psychiatric/Behavioral: Negative.        Objective:     BP 127/77   Pulse 79   Temp (!) 97.2 F (36.2 C)   Wt 257 lb 12.8 oz (116.9 kg)   LMP 07/19/2022 (Approximate)   SpO2 99%   BMI 35.96 kg/m  BP Readings from Last 3 Encounters:  11/08/22 127/77  10/17/22 131/84  10/16/22 119/76   Wt Readings from Last 3 Encounters:  11/08/22 257 lb 12.8 oz (116.9 kg)  10/17/22 258 lb (117 kg)  10/16/22 260 lb (117.9 kg)      Physical Exam Eyes:     Pupils: Pupils are equal, round, and reactive to light.  Cardiovascular:     Rate and Rhythm: Normal rate and regular rhythm.  Pulmonary:     Effort: Pulmonary effort is normal.  Abdominal:     General: Bowel sounds are normal.  Neurological:     General: No focal deficit present.     Mental Status: Mental status is at baseline.  Psychiatric:        Mood and Affect: Mood normal.        Behavior: Behavior normal.        Thought Content: Thought content normal.        Judgment: Judgment normal.     Results for orders placed or performed in visit on 11/08/22  POCT glycosylated hemoglobin (Hb A1C)  Result Value Ref Range   Hemoglobin A1C 6.6 (A) 4.0 - 5.6 %   HbA1c POC (<> result, manual entry)     HbA1c, POC (prediabetic range)     HbA1c, POC (controlled diabetic range)      Last CBC Lab Results  Component Value Date   WBC 13.6 (H) 10/16/2022   HGB 12.5 10/16/2022   HCT 38.5 10/16/2022   MCV 79.9 (L) 10/16/2022   MCH 25.9 (L) 10/16/2022   RDW 14.3 10/16/2022   PLT 261 10/16/2022   Last  metabolic panel Lab Results  Component Value Date   GLUCOSE 111 (H) 10/16/2022   NA 136 10/16/2022   K 3.7 10/16/2022   CL  109 10/16/2022   CO2 19 (L) 10/16/2022   BUN 13 10/16/2022   CREATININE 0.97 10/16/2022   GFRNONAA >60 10/16/2022   CALCIUM 8.9 10/16/2022   PROT 7.7 10/16/2022   ALBUMIN 3.8 10/16/2022   BILITOT 0.7 10/16/2022   ALKPHOS 58 10/16/2022   AST 15 10/16/2022   ALT 12 10/16/2022   ANIONGAP 8 10/16/2022   Last lipids No results found for: "CHOL", "HDL", "LDLCALC", "LDLDIRECT", "TRIG", "CHOLHDL" Last hemoglobin A1c Lab Results  Component Value Date   HGBA1C 6.6 (A) 11/08/2022   Last thyroid functions Lab Results  Component Value Date   TSH 0.552 03/18/2022   Last vitamin D No results found for: "25OHVITD2", "25OHVITD3", "VD25OH" Last vitamin B12 and Folate No results found for: "VITAMINB12", "FOLATE"    The ASCVD Risk score (Arnett DK, et al., 2019) failed to calculate for the following reasons:   Cannot find a previous HDL lab   Cannot find a previous total cholesterol lab    Assessment & Plan:   Problem List Items Addressed This Visit   None Visit Diagnoses     Prediabetes    -  Primary   Relevant Medications   metFORMIN (GLUCOPHAGE) 500 MG tablet   Other Relevant Orders   POCT glycosylated hemoglobin (Hb A1C) (Completed)     1. Prediabetes Today, patient's hemoglobin A1c is increased to 6.6 g/dL.  Will start metformin 500 mg twice daily.  Discussed at length.  Also, discussed the importance of following a carbohydrate modified diet divided over small meals throughout the day.  Patient will return to clinic in 3 months to reassess hemoglobin A1c. - POCT glycosylated hemoglobin (Hb A1C) - metFORMIN (GLUCOPHAGE) 500 MG tablet; Take 1 tablet (500 mg total) by mouth daily with breakfast.  Dispense: 90 tablet; Refill: 2 - CMP and Liver  2. Obesity (BMI 35.0-39.9 without comorbidity) The patient is asked to make an attempt to improve diet and  exercise patterns to aid in medical management of this problem.   3. Polydipsia   4. Hyperglycemia  - CMP and Liver   Return in about 3 months (around 02/08/2023) for prediabetes.    Nolon Nations  APRN, MSN, FNP-C Patient Care [facility] [address] [place], [place] [address] [phone]

## 2022-11-09 ENCOUNTER — Institutional Professional Consult (permissible substitution): Payer: BC Managed Care – PPO | Admitting: Obstetrics and Gynecology

## 2022-11-09 LAB — CMP AND LIVER
ALT: 10 IU/L (ref 0–32)
AST: 12 IU/L (ref 0–40)
Albumin: 4.7 g/dL (ref 3.9–4.9)
Alkaline Phosphatase: 95 IU/L (ref 44–121)
BUN: 6 mg/dL (ref 6–24)
Bilirubin Total: <0.2 mg/dL (ref 0.0–1.2)
Bilirubin, Direct: <0.1 mg/dL (ref 0.00–0.40)
CO2: 19 mmol/L — ABNORMAL LOW (ref 20–29)
Calcium: 10 mg/dL (ref 8.7–10.2)
Chloride: 106 mmol/L (ref 96–106)
Creatinine, Ser: 1.03 mg/dL — ABNORMAL HIGH (ref 0.57–1.00)
Glucose: 131 mg/dL — ABNORMAL HIGH (ref 70–99)
Potassium: 5 mmol/L (ref 3.5–5.2)
Sodium: 142 mmol/L (ref 134–144)
Total Protein: 7.8 g/dL (ref 6.0–8.5)
eGFR: 69 mL/min/{1.73_m2} (ref >59)

## 2022-12-26 ENCOUNTER — Other Ambulatory Visit: Payer: Self-pay | Admitting: Physician Assistant

## 2022-12-26 DIAGNOSIS — D500 Iron deficiency anemia secondary to blood loss (chronic): Secondary | ICD-10-CM

## 2022-12-27 ENCOUNTER — Inpatient Hospital Stay: Payer: BC Managed Care – PPO | Attending: Physician Assistant

## 2022-12-27 ENCOUNTER — Inpatient Hospital Stay: Payer: BC Managed Care – PPO | Admitting: Physician Assistant

## 2023-01-27 ENCOUNTER — Emergency Department (HOSPITAL_BASED_OUTPATIENT_CLINIC_OR_DEPARTMENT_OTHER): Payer: Medicaid Other

## 2023-01-27 ENCOUNTER — Emergency Department (HOSPITAL_BASED_OUTPATIENT_CLINIC_OR_DEPARTMENT_OTHER): Payer: Medicaid Other | Admitting: Radiology

## 2023-01-27 ENCOUNTER — Other Ambulatory Visit: Payer: Self-pay

## 2023-01-27 ENCOUNTER — Encounter (HOSPITAL_BASED_OUTPATIENT_CLINIC_OR_DEPARTMENT_OTHER): Payer: Self-pay | Admitting: Emergency Medicine

## 2023-01-27 ENCOUNTER — Emergency Department (HOSPITAL_BASED_OUTPATIENT_CLINIC_OR_DEPARTMENT_OTHER)
Admission: EM | Admit: 2023-01-27 | Discharge: 2023-01-27 | Disposition: A | Discharge status: Home / Self Care | Payer: Medicaid Other | Attending: Emergency Medicine | Admitting: Emergency Medicine

## 2023-01-27 DIAGNOSIS — D219 Benign neoplasm of connective and other soft tissue, unspecified: Secondary | ICD-10-CM

## 2023-01-27 DIAGNOSIS — D259 Leiomyoma of uterus, unspecified: Secondary | ICD-10-CM | POA: Diagnosis not present

## 2023-01-27 DIAGNOSIS — R109 Unspecified abdominal pain: Secondary | ICD-10-CM

## 2023-01-27 LAB — CBC
HCT: 39.8 % (ref 36.0–46.0)
Hemoglobin: 13.1 g/dL (ref 12.0–15.0)
MCH: 26.3 pg (ref 26.0–34.0)
MCHC: 32.9 g/dL (ref 30.0–36.0)
MCV: 79.9 fL — ABNORMAL LOW (ref 80.0–100.0)
Platelets: 258 10*3/uL (ref 150–400)
RBC: 4.98 MIL/uL (ref 3.87–5.11)
RDW: 14.5 % (ref 11.5–15.5)
WBC: 9.1 10*3/uL (ref 4.0–10.5)
nRBC: 0 % (ref 0.0–0.2)

## 2023-01-27 LAB — COMPREHENSIVE METABOLIC PANEL
ALT: 7 U/L (ref 0–44)
AST: 12 U/L — ABNORMAL LOW (ref 15–41)
Albumin: 4.1 g/dL (ref 3.5–5.0)
Alkaline Phosphatase: 69 U/L (ref 38–126)
Anion gap: 9 (ref 5–15)
BUN: 9 mg/dL (ref 6–20)
CO2: 22 mmol/L (ref 22–32)
Calcium: 9.5 mg/dL (ref 8.9–10.3)
Chloride: 105 mmol/L (ref 98–111)
Creatinine, Ser: 1.03 mg/dL — ABNORMAL HIGH (ref 0.44–1.00)
GFR, Estimated: >60 mL/min (ref >60)
Glucose, Bld: 133 mg/dL — ABNORMAL HIGH (ref 70–99)
Potassium: 4.1 mmol/L (ref 3.5–5.1)
Sodium: 136 mmol/L (ref 135–145)
Total Bilirubin: 0.5 mg/dL (ref 0.3–1.2)
Total Protein: 7.7 g/dL (ref 6.5–8.1)

## 2023-01-27 LAB — PREGNANCY, URINE: Preg Test, Ur: NEGATIVE

## 2023-01-27 LAB — URINALYSIS, ROUTINE W REFLEX MICROSCOPIC
Bacteria, UA: NONE SEEN
Bilirubin Urine: NEGATIVE
Glucose, UA: NEGATIVE mg/dL
Hgb urine dipstick: NEGATIVE
Ketones, ur: NEGATIVE mg/dL
Leukocytes,Ua: NEGATIVE
Nitrite: NEGATIVE
Specific Gravity, Urine: 1.018 (ref 1.005–1.030)
pH: 5.5 (ref 5.0–8.0)

## 2023-01-27 MED ORDER — SODIUM CHLORIDE 0.9 % IV BOLUS
1000.0000 mL | Freq: Once | INTRAVENOUS | Status: AC
  Administered 2023-01-27: 1000 mL via INTRAVENOUS

## 2023-01-27 MED ORDER — IBUPROFEN 800 MG PO TABS: 800.0000 mg | ORAL_TABLET | Freq: Three times a day (TID) | ORAL | 0 refills | Status: DC | PRN

## 2023-01-27 MED ORDER — MORPHINE SULFATE (PF) 4 MG/ML IV SOLN
4.0000 mg | Freq: Once | INTRAVENOUS | Status: AC
  Administered 2023-01-27: 4 mg via INTRAVENOUS
  Filled 2023-01-27: qty 1

## 2023-01-27 MED ORDER — KETOROLAC TROMETHAMINE 15 MG/ML IJ SOLN
15.0000 mg | Freq: Once | INTRAMUSCULAR | Status: AC
  Administered 2023-01-27: 15 mg via INTRAVENOUS
  Filled 2023-01-27: qty 1

## 2023-01-27 MED ORDER — IOHEXOL 300 MG/ML  SOLN
100.0000 mL | Freq: Once | INTRAMUSCULAR | Status: AC | PRN
  Administered 2023-01-27: 100 mL via INTRAVENOUS

## 2023-01-27 NOTE — Discharge Instructions (Signed)
I suspect that your pain is likely secondary to your fibroids.  Please follow-up with your OB/GYN, for further evaluation, I recommend that you get a pelvic MRI done, as well as you have some findings on your liver that may require MRI of your abdomen.  Please follow-up with your primary care doctor, the liver is in incidental finding.  Return to the ER if you feel like your symptoms are getting worse.  I recommend using a hot water bottle, and the ibuprofen I prescribed.

## 2023-01-27 NOTE — ED Notes (Signed)
Patient transported to CT 

## 2023-01-27 NOTE — ED Provider Notes (Signed)
[place] EMERGENCY DEPARTMENT AT [facility] Provider Note   CSN: [id number] Arrival date & time: 01/27/23  1647     History  Chief Complaint  Patient presents with   Back Pain    JILLEAN LIVA is a 47 y.o. female, history of pyelonephritis, chronic pain, who presents to the ED secondary to left flank pain, rating down her left leg, has been going on for the last few days.  She states that she has stabbing pain in her left flank, and it radiates down her leg.  Denies any kind of vomiting, but does endorse some diarrhea and nausea.  Denies any urinary symptoms, or vaginal discharge.  Denies any buttocks pain.    Home Medications Prior to Admission medications   Medication Sig Start Date End Date Taking? Authorizing Provider  ibuprofen (ADVIL) 800 MG tablet Take 1 tablet (800 mg total) by mouth every 8 (eight) hours as needed. 01/27/23  Yes Cynai Skeens L, PA  DULoxetine (CYMBALTA) 30 MG capsule Take 90 mg by mouth daily. 08/26/22   [provider]  ergocalciferol (VITAMIN D2) 1.25 MG (50000 UT) capsule Take 50,000 Units by mouth once a week.    [provider]  lidocaine (LIDODERM) 5 % Place 1 patch onto the skin daily. Remove & Discard patch within 12 hours or as directed by MD Patient not taking: Reported on 11/08/2022 06/27/22   Lorre Nick, MD  megestrol (MEGACE) 40 MG tablet Take 1 tablet (40 mg total) by mouth 2 (two) times daily. Can increase to two tablets twice a day in the event of heavy bleeding 09/28/22   Willodean Rosenthal, MD  metFORMIN (GLUCOPHAGE) 500 MG tablet Take 1 tablet (500 mg total) by mouth daily with breakfast. 11/08/22   Massie Maroon, FNP  Multiple Vitamin (MULTIVITAMIN) tablet Take 1 tablet by mouth daily.    [provider]  naproxen (NAPROSYN) 500 MG tablet Take 1 tablet (500 mg total) by mouth 2 (two) times daily. 10/16/22   Mannie Stabile, PA-C  ondansetron (ZOFRAN-ODT) 4 MG disintegrating tablet Take 1  tablet (4 mg total) by mouth every 8 (eight) hours as needed for nausea or vomiting. 10/16/22   Mannie Stabile, PA-C  oxyCODONE (ROXICODONE) 15 MG immediate release tablet Take 15 mg by mouth 4 (four) times daily as needed for pain. 03/04/22   [provider]      Allergies    Patient has no known allergies.    Review of Systems   Review of Systems  Genitourinary:  Positive for flank pain.  Musculoskeletal:  Negative for back pain.    Physical Exam Updated Vital Signs BP 118/67   Pulse (!) 58   Temp 98.4 F (36.9 C) (Oral)   Resp 20   SpO2 95%  Physical Exam Vitals and nursing note reviewed.  Constitutional:      General: She is not in acute distress.    Appearance: She is well-developed.  HENT:     Head: Normocephalic and atraumatic.  Eyes:     Conjunctiva/sclera: Conjunctivae normal.  Cardiovascular:     Rate and Rhythm: Normal rate and regular rhythm.     Heart sounds: No murmur heard. Pulmonary:     Effort: Pulmonary effort is normal. No respiratory distress.     Breath sounds: Normal breath sounds.  Abdominal:     Palpations: Abdomen is soft.     Tenderness: There is abdominal tenderness in the left lower quadrant.  Musculoskeletal:  General: No swelling.     Cervical back: Neck supple.  Skin:    General: Skin is warm and dry.     Capillary Refill: Capillary refill takes less than 2 seconds.  Neurological:     Mental Status: She is alert.  Psychiatric:        Mood and Affect: Mood normal.     ED Results / Procedures / Treatments   Labs (all labs ordered are listed, but only abnormal results are displayed) Labs Reviewed  COMPREHENSIVE METABOLIC PANEL - Abnormal; Notable for the following components:      Result Value   Glucose, Bld 133 (*)    Creatinine, Ser 1.03 (*)    AST 12 (*)    All other components within normal limits  CBC - Abnormal; Notable for the following components:   MCV 79.9 (*)    All other components within normal  limits  URINALYSIS, ROUTINE W REFLEX MICROSCOPIC - Abnormal; Notable for the following components:   APPearance HAZY (*)    Protein, ur TRACE (*)    All other components within normal limits  PREGNANCY, URINE    EKG None  Radiology CT ABDOMEN PELVIS W CONTRAST  Result Date: 01/27/2023 CLINICAL DATA:  Left lower quadrant pain EXAM: CT ABDOMEN AND PELVIS WITH CONTRAST TECHNIQUE: Multidetector CT imaging of the abdomen and pelvis was performed using the standard protocol following bolus administration of intravenous contrast. RADIATION DOSE REDUCTION: This exam was performed according to the departmental dose-optimization program which includes automated exposure control, adjustment of the mA and/or kV according to patient size and/or use of iterative reconstruction technique. CONTRAST:  OMNIPAQUE IOHEXOL 300 MG/ML  SOLN COMPARISON:  Pelvic ultrasound 10/16/2022. CT renal stone 06/30/2020. CT abdomen and pelvis 12/16/2015. FINDINGS: Lower chest: No acute abnormality. Hepatobiliary: Gallbladder is contracted. No biliary ductal dilatation. Lobulated hypodense lesion in the right lobe of the liver measures 3.5 by 2.2 cm and appears unchanged. This measures 39 Hounsfield units and appear similar to 2017. Pancreas: Unremarkable. No pancreatic ductal dilatation or surrounding inflammatory changes. Spleen: Normal in size without focal abnormality. Adrenals/Urinary Tract: Adrenal glands are unremarkable. Kidneys are normal, without renal calculi, focal lesion, or hydronephrosis. Bladder is unremarkable. Stomach/Bowel: Stomach is within normal limits. Appendix appears normal. No evidence of bowel wall thickening, distention, or inflammatory changes. Vascular/Lymphatic: No significant vascular findings are present. No enlarged abdominal or pelvic lymph nodes. Reproductive: Uterus is enlarged and lobulated containing fibroids measuring up to 5.3 cm. The ovaries are within normal limits. There is questionable  fluid in the endometrial canal. Other: No abdominal wall hernia or abnormality. No abdominopelvic ascites. Musculoskeletal: No acute or significant osseous findings. IMPRESSION: 1. No acute localizing process in the abdomen or pelvis. 2. Fibroid uterus. 3. Questionable fluid in the endometrial canal. Correlate clinically for menstrual cycle. Consider follow-up pelvic MRI. 4. Stable hypodense lesion in the right lobe of the liver, indeterminate. Given that this is stable from 2017 in his likely benign such as complex cyst or hemangioma. If more definitive characterization is required, recommend MRI. Electronically Signed   By: Darliss Cheney M.D.   On: 01/27/2023 21:54   DG Lumbar Spine 2-3 Views  Result Date: 01/27/2023 CLINICAL DATA:  Back pain EXAM: LUMBAR SPINE - 2-3 VIEW COMPARISON:  None Available. FINDINGS: There is no evidence of lumbar spine fracture. Alignment is normal. Intervertebral disc spaces are maintained. IMPRESSION: Negative. Electronically Signed   By: Darliss Cheney M.D.   On: 01/27/2023 20:30  Procedures Procedures    Medications Ordered in ED Medications  iohexol (OMNIPAQUE) 300 MG/ML solution 100 mL (100 mLs Intravenous Contrast Given 01/27/23 2010)  ketorolac (TORADOL) 15 MG/ML injection 15 mg (15 mg Intravenous Given 01/27/23 2002)  sodium chloride 0.9 % bolus 1,000 mL (0 mLs Intravenous Stopped 01/27/23 2130)  morphine (PF) 4 MG/ML injection 4 mg (4 mg Intravenous Given 01/27/23 2130)    ED Course/ Medical Decision Making/ A&P                                 Medical Decision Making Patient is a 47 year old female, here for left lower abdominal pain/flank pain, it has been going on for the last few days, has associated nausea and a few episodes of diarrhea.  Goes down her leg.  Denies any urinary symptoms, or vaginal discharge.  Will obtain CT abdomen pelvis, given diffuse flank pain, tenderness to the left lower quadrant abdominal tenderness.  Overall  well-appearing.  Amount and/or Complexity of Data Reviewed Labs: ordered.    Details: Unremarkable labs, urinalysis unremarkable Radiology: ordered.    Details: CT abdomen pelvis, shows fibroids, and possible fluid in the endometrial canal Discussion of management or test interpretation with external provider(s): Discussed with patient, this may be fibroid pain, given the referral of the pain down her legs, we discussed that she should follow-up with a gynecologist, and use ibuprofen, heating pads for relief.  There is no masses noted on her ovaries, and this has been going on for several days, thus I think torsion is unlikely.  Her pain is improved with the morphine.  She is well-appearing, able to tolerate p.o. intake.  Not concern for STDs.  Discussed return precautions she voiced understanding discharged home  Risk Prescription drug management.   Final Clinical Impression(s) / ED Diagnoses Final diagnoses:  Fibroids  Flank pain    Rx / DC Orders ED Discharge Orders          Ordered    ibuprofen (ADVIL) 800 MG tablet  Every 8 hours PRN        01/27/23 2209              Pete Pelt, [place] 01/27/23 2333    Rondel Baton, MD 01/29/23 1001

## 2023-01-27 NOTE — ED Triage Notes (Signed)
Back pain to left lower back radiating down to left foot. Pt also reports 2 days of diarrhea.  No fever or chills.  No CP or SHOB.

## 2023-01-30 ENCOUNTER — Other Ambulatory Visit: Payer: Self-pay

## 2023-01-30 ENCOUNTER — Encounter (HOSPITAL_COMMUNITY): Payer: Self-pay | Admitting: *Deleted

## 2023-01-30 ENCOUNTER — Emergency Department (HOSPITAL_COMMUNITY)
Admission: EM | Admit: 2023-01-30 | Discharge: 2023-01-30 | Disposition: A | Discharge status: Home / Self Care | Payer: Medicaid Other | Attending: Emergency Medicine | Admitting: Emergency Medicine

## 2023-01-30 DIAGNOSIS — R519 Headache, unspecified: Secondary | ICD-10-CM | POA: Diagnosis present

## 2023-01-30 MED ORDER — DIPHENHYDRAMINE HCL 50 MG/ML IJ SOLN
25.0000 mg | Freq: Once | INTRAMUSCULAR | Status: AC
  Administered 2023-01-30: 25 mg via INTRAVENOUS
  Filled 2023-01-30: qty 1

## 2023-01-30 MED ORDER — PROCHLORPERAZINE EDISYLATE 10 MG/2ML IJ SOLN
10.0000 mg | Freq: Once | INTRAMUSCULAR | Status: AC
  Administered 2023-01-30: 10 mg via INTRAVENOUS
  Filled 2023-01-30: qty 2

## 2023-01-30 MED ORDER — SODIUM CHLORIDE 0.9 % IV BOLUS
1000.0000 mL | Freq: Once | INTRAVENOUS | Status: AC
  Administered 2023-01-30: 1000 mL via INTRAVENOUS

## 2023-01-30 NOTE — Discharge Instructions (Addendum)
We recommend use of over-the-counter Aleve or Excedrin Migraine for management of any persistent headache complaints.  Follow-up with your primary care doctor as needed.  Return for new or concerning symptoms.

## 2023-01-30 NOTE — ED Provider Notes (Signed)
Gabriella Snyder EMERGENCY DEPARTMENT AT [facility] Provider Note   CSN: [id number] Arrival date & time: 01/30/23  0450     History  Chief Complaint  Patient presents with   Headache    Gabriella Snyder is a 47 y.o. female.  47 year old female with a history of migraine headaches, anemia, chronic pain presents to the emergency department for evaluation of a headache.  She states that she woke up approximately 1 hour ago with a constant headache to her central frontal region, between her eyes.  She has had some nausea as well as intermittent blurry vision.  Notes some light sensitivity as well.  Took 15 mg oxycodone as well as 800 mg ibuprofen 4 hours ago for management of some ongoing left low back pain which she attributes to her fibroids.  She has not had any change in her headache with these medications.  No associated fevers, head trauma, complete vision loss, new or worsening extremity numbness or paresthesias, extremity weakness.  Feels as though pain is more severe than her typical headaches.  The history is provided by the patient. No language interpreter was used.  Headache      Home Medications Prior to Admission medications   Medication Sig Start Date End Date Taking? Authorizing Provider  DULoxetine (CYMBALTA) 30 MG capsule Take 90 mg by mouth daily. 08/26/22   [provider]  ergocalciferol (VITAMIN D2) 1.25 MG (50000 UT) capsule Take 50,000 Units by mouth once a week.    [provider]  ibuprofen (ADVIL) 800 MG tablet Take 1 tablet (800 mg total) by mouth every 8 (eight) hours as needed. 01/27/23   Small, Brooke L, PA  lidocaine (LIDODERM) 5 % Place 1 patch onto the skin daily. Remove & Discard patch within 12 hours or as directed by MD Patient not taking: Reported on 11/08/2022 06/27/22   Lorre Nick, MD  megestrol (MEGACE) 40 MG tablet Take 1 tablet (40 mg total) by mouth 2 (two) times daily. Can increase to two tablets twice a day in the  event of heavy bleeding 09/28/22   Willodean Rosenthal, MD  metFORMIN (GLUCOPHAGE) 500 MG tablet Take 1 tablet (500 mg total) by mouth daily with breakfast. 11/08/22   Massie Maroon, FNP  Multiple Vitamin (MULTIVITAMIN) tablet Take 1 tablet by mouth daily.    [provider]  naproxen (NAPROSYN) 500 MG tablet Take 1 tablet (500 mg total) by mouth 2 (two) times daily. 10/16/22   Mannie Stabile, PA-C  ondansetron (ZOFRAN-ODT) 4 MG disintegrating tablet Take 1 tablet (4 mg total) by mouth every 8 (eight) hours as needed for nausea or vomiting. 10/16/22   Mannie Stabile, PA-C  oxyCODONE (ROXICODONE) 15 MG immediate release tablet Take 15 mg by mouth 4 (four) times daily as needed for pain. 03/04/22   [provider]      Allergies    Patient has no known allergies.    Review of Systems   Review of Systems  Neurological:  Positive for headaches.  Ten systems reviewed and are negative for acute change, except as noted in the HPI.    Physical Exam Updated Vital Signs BP (!) 143/79   Pulse 70   Temp 97.8 F (36.6 C) (Oral)   Resp 16   SpO2 99%   Physical Exam Vitals and nursing note reviewed.  Constitutional:      General: She is not in acute distress.    Appearance: She is well-developed. She is not diaphoretic.  Comments: Nontoxic-appearing and in no distress.  Does seem uncomfortable.  HENT:     Head: Normocephalic and atraumatic.  Eyes:     General: No scleral icterus.    Extraocular Movements: EOM normal.     Conjunctiva/sclera: Conjunctivae normal.  Neck:     Comments: No nuchal rigidity or meningismus Cardiovascular:     Rate and Rhythm: Normal rate and regular rhythm.     Pulses: Normal pulses.  Pulmonary:     Effort: Pulmonary effort is normal. No respiratory distress.     Comments: Respirations even and unlabored Musculoskeletal:        General: Normal range of motion.     Cervical back: Normal range of motion.  Skin:    General:  Skin is warm and dry.     Coloration: Skin is not pale.     Findings: No erythema or rash.  Neurological:     General: No focal deficit present.     Mental Status: She is alert and oriented to person, place, and time.     Cranial Nerves: No cranial nerve deficit.     Coordination: Coordination normal.     Comments: GCS 15. Speech is goal oriented. No CN or other focal deficits appreciated. Sensation to light touch intact. Patient moves extremities without ataxia.   Psychiatric:        Mood and Affect: Mood and affect normal.        Behavior: Behavior normal.     ED Results / Procedures / Treatments   Labs (all labs ordered are listed, but only abnormal results are displayed) Labs Reviewed - No data to display  EKG None  Radiology No results found.  Procedures Procedures    Medications Ordered in ED Medications  sodium chloride 0.9 % bolus 1,000 mL (1,000 mLs Intravenous New Bag/Given 01/30/23 0550)  prochlorperazine (COMPAZINE) injection 10 mg (10 mg Intravenous Given 01/30/23 0549)  diphenhydrAMINE (BENADRYL) injection 25 mg (25 mg Intravenous Given 01/30/23 0550)    ED Course/ Medical Decision Making/ A&P Clinical Course as of 01/30/23 0631  Mon Jan 30, 2023  6644 Patient reports that headache is down to a 4/10 from a 9/10 on arrival.  She declines additional medications and states that she is feeling better and wishes to go home to rest. [KH]    Clinical Course User Index [KH] Antony Madura, PA-C                                 Medical Decision Making Risk Prescription drug management.   This patient presents to the ED for concern of headache, this involves an extensive number of treatment options, and is a complaint that carries with it a high risk of complications and morbidity.  The differential diagnosis includes migraine vs ICH vs IIH vs tumor/mass vs CVA   Co morbidities that complicate the patient evaluation  Migraine headaches Anemia Chronic  pain/chronic opiate use   Additional history obtained:  External records from outside source obtained and reviewed including CT abd/pelvis on 01/27/23 for c/o LLQ pain. Imaging notable only for fibroid uterus at this time.   Cardiac Monitoring:  The patient was maintained on a cardiac monitor.  I personally viewed and interpreted the cardiac monitored which showed an underlying rhythm of: NSR   Medicines ordered and prescription drug management:  I ordered medication including Compazine and Benadryl for headache  Reevaluation of the patient after these medicines  showed that the patient improved I have reviewed the patients home medicines and have made adjustments as needed   Test Considered:  Head CT   Problem List / ED Course:  Patient presents to the emergency department for evaluation of headache which began 1 hour PTA.  Patient with no history of recent head injury or trauma.  No fever, nuchal rigidity, meningismus to suggest meningitis.  Neurologic exam today is nonfocal.   On reassessment, the patient has had significant improvement in headache symptoms following a migraine cocktail.  I do not believe further emergent workup is indicated at this time.     Reevaluation:  After the interventions noted above, I reevaluated the patient and found that they have :improved   Social Determinants of Health:  Good social support; spouse at bedside   Dispostion:  After consideration of the diagnostic results and the patients response to treatment, I feel that the patent would benefit from outpatient supportive care, PCP f/u. Return precautions discussed and provided. Patient discharged in stable condition with no unaddressed concerns.          Final Clinical Impression(s) / ED Diagnoses Final diagnoses:  Frontal headache    Rx / DC Orders ED Discharge Orders     None         Antony Madura, PA-C 01/30/23 5621    Gilda Crease, MD 01/31/23  [phone]

## 2023-01-30 NOTE — ED Triage Notes (Signed)
Pt reporting left lower back pain that radiates to her legs from her fibroids, seen here for the same recently. Woke up tonight about 1 hour ago with headache, associated with nausea. She says she feels like she is straining to open her eyes and her vision is blurry. Last took oxy 15 and ibuprofen 800mg  around 4 hours ago.

## 2023-02-07 ENCOUNTER — Ambulatory Visit: Payer: Medicaid Other | Admitting: Family Medicine

## 2023-02-16 ENCOUNTER — Ambulatory Visit (INDEPENDENT_AMBULATORY_CARE_PROVIDER_SITE_OTHER): Payer: Medicaid Other | Admitting: Nurse Practitioner

## 2023-02-16 ENCOUNTER — Encounter: Payer: Self-pay | Admitting: Nurse Practitioner

## 2023-02-16 VITALS — BP 121/71 | HR 78 | Temp 98.1°F | Ht 71.0 in | Wt 256.0 lb

## 2023-02-16 DIAGNOSIS — J069 Acute upper respiratory infection, unspecified: Secondary | ICD-10-CM | POA: Diagnosis not present

## 2023-02-16 MED ORDER — AZITHROMYCIN 250 MG PO TABS
ORAL_TABLET | ORAL | 0 refills | Status: AC
Start: 2023-02-16 — End: 2023-02-21

## 2023-02-16 MED ORDER — BENZONATATE 200 MG PO CAPS: 200.0000 mg | ORAL_CAPSULE | Freq: Two times a day (BID) | ORAL | 0 refills | Status: DC | PRN

## 2023-02-16 MED ORDER — PREDNISONE 20 MG PO TABS
20.0000 mg | ORAL_TABLET | Freq: Every day | ORAL | 0 refills | Status: AC
Start: 2023-02-16 — End: 2023-02-21

## 2023-02-16 NOTE — Patient Instructions (Signed)
1. Upper respiratory tract infection, unspecified type  - azithromycin (ZITHROMAX) 250 MG tablet; Take 2 tablets on day 1, then 1 tablet daily on days 2 through 5  Dispense: 6 tablet; Refill: 0 - predniSONE (DELTASONE) 20 MG tablet; Take 1 tablet (20 mg total) by mouth daily with breakfast for 5 days.  Dispense: 5 tablet; Refill: 0 - benzonatate (TESSALON) 200 MG capsule; Take 1 capsule (200 mg total) by mouth 2 (two) times daily as needed for cough.  Dispense: 20 capsule; Refill: 0    Follow up:  Follow up with PCP

## 2023-02-16 NOTE — Progress Notes (Signed)
Subjective   Patient ID: Gabriella Snyder, female    DOB: [birth year], 47 y.o.   MRN: [id number]  Chief Complaint  Patient presents with   Cough    Referring provider: Massie Maroon, FNP  Loraine Maple is a 47 y.o. female with Past Medical History: No date: Anemia No date: Anxiety No date: Chronic pain No date: Degenerative disc disease, cervical No date: Depression No date: Heart murmur     Comment:  "when I was younger; it closed up" 1993: History of blood transfusion     Comment:  "related to the C-section No date: Migraine     Comment:  "a couple times/yr" (02/25/2014) No date: Muscle spasm No date: PONV (postoperative nausea and vomiting) No date: Pre-diabetes 02/25/2014: Pyelonephritis   Cough This is a new problem. The current episode started 1 to 4 weeks ago. The problem has been unchanged. The problem occurs constantly. The cough is Productive of sputum. The symptoms are aggravated by pollens. She has tried OTC cough suppressant for the symptoms. The treatment provided mild relief.       No Known Allergies  Immunization History  Administered Date(s) Administered   HPV 9-valent 01/15/2018, 03/27/2018   HPV Quadrivalent 07/18/2018   Influenza,inj,Quad PF,6+ Mos 02/27/2014, 01/15/2018   Influenza-Unspecified 04/04/2022   Tdap 03/27/2018    Tobacco History: Social History   Tobacco Use  Smoking Status Every Day   Current packs/day: 0.25   Average packs/day: 0.3 packs/day for 6.0 years (1.5 ttl pk-yrs)   Types: Cigarettes  Smokeless Tobacco Never   Ready to quit: Not Answered Counseling given: Not Answered   Outpatient Encounter Medications as of 02/16/2023  Medication Sig   azithromycin (ZITHROMAX) 250 MG tablet Take 2 tablets on day 1, then 1 tablet daily on days 2 through 5   benzonatate (TESSALON) 200 MG capsule Take 1 capsule (200 mg total) by mouth 2 (two) times daily as needed for cough.   DULoxetine (CYMBALTA) 30 MG capsule Take  90 mg by mouth daily.   ergocalciferol (VITAMIN D2) 1.25 MG (50000 UT) capsule Take 50,000 Units by mouth once a week.   ibuprofen (ADVIL) 800 MG tablet Take 1 tablet (800 mg total) by mouth every 8 (eight) hours as needed.   lidocaine (LIDODERM) 5 % Place 1 patch onto the skin daily. Remove & Discard patch within 12 hours or as directed by MD   megestrol (MEGACE) 40 MG tablet Take 1 tablet (40 mg total) by mouth 2 (two) times daily. Can increase to two tablets twice a day in the event of heavy bleeding   metFORMIN (GLUCOPHAGE) 500 MG tablet Take 1 tablet (500 mg total) by mouth daily with breakfast.   Multiple Vitamin (MULTIVITAMIN) tablet Take 1 tablet by mouth daily.   naproxen (NAPROSYN) 500 MG tablet Take 1 tablet (500 mg total) by mouth 2 (two) times daily.   ondansetron (ZOFRAN-ODT) 4 MG disintegrating tablet Take 1 tablet (4 mg total) by mouth every 8 (eight) hours as needed for nausea or vomiting.   oxyCODONE (ROXICODONE) 15 MG immediate release tablet Take 15 mg by mouth 4 (four) times daily as needed for pain.   predniSONE (DELTASONE) 20 MG tablet Take 1 tablet (20 mg total) by mouth daily with breakfast for 5 days.   No facility-administered encounter medications on file as of 02/16/2023.    Review of Systems  Review of Systems  Constitutional: Negative.   HENT: Negative.    Respiratory:  Positive for cough.  Cardiovascular: Negative.   Gastrointestinal: Negative.   Allergic/Immunologic: Negative.   Neurological: Negative.   Psychiatric/Behavioral: Negative.       Objective:   BP 121/71 (BP Location: Left Arm, Patient Position: Sitting, Cuff Size: Large)   Pulse 78   Temp 98.1 F (36.7 C) (Oral)   Ht 5\' 11"  (1.803 m)   Wt 256 lb (116.1 kg)   SpO2 96%   BMI 35.70 kg/m   Wt Readings from Last 5 Encounters:  02/16/23 256 lb (116.1 kg)  11/08/22 257 lb 12.8 oz (116.9 kg)  10/17/22 258 lb (117 kg)  10/16/22 260 lb (117.9 kg)  08/10/22 261 lb (118.4 kg)      Physical Exam Vitals and nursing note reviewed.  Constitutional:      General: She is not in acute distress.    Appearance: She is well-developed.  Cardiovascular:     Rate and Rhythm: Normal rate and regular rhythm.  Pulmonary:     Effort: Pulmonary effort is normal.     Breath sounds: Normal breath sounds.  Neurological:     Mental Status: She is alert and oriented to person, place, and time.       Assessment & Plan:   Upper respiratory tract infection, unspecified type -     Azithromycin; Take 2 tablets on day 1, then 1 tablet daily on days 2 through 5  Dispense: 6 tablet; Refill: 0 -     predniSONE; Take 1 tablet (20 mg total) by mouth daily with breakfast for 5 days.  Dispense: 5 tablet; Refill: 0 -     Benzonatate; Take 1 capsule (200 mg total) by mouth 2 (two) times daily as needed for cough.  Dispense: 20 capsule; Refill: 0     Return if symptoms worsen or fail to improve.   Ivonne Andrew, NP 02/16/2023

## 2023-04-11 ENCOUNTER — Ambulatory Visit: Payer: Medicaid Other | Admitting: Family Medicine

## 2023-04-11 ENCOUNTER — Encounter: Payer: Self-pay | Admitting: Family Medicine

## 2023-04-11 VITALS — BP 121/85 | HR 85 | Temp 97.0°F | Wt 254.0 lb

## 2023-04-11 DIAGNOSIS — D561 Beta thalassemia: Secondary | ICD-10-CM | POA: Diagnosis not present

## 2023-04-11 DIAGNOSIS — E669 Obesity, unspecified: Secondary | ICD-10-CM | POA: Diagnosis not present

## 2023-04-11 DIAGNOSIS — N921 Excessive and frequent menstruation with irregular cycle: Secondary | ICD-10-CM

## 2023-04-11 DIAGNOSIS — R7303 Prediabetes: Secondary | ICD-10-CM

## 2023-04-11 DIAGNOSIS — Z13220 Encounter for screening for lipoid disorders: Secondary | ICD-10-CM

## 2023-04-11 LAB — POCT GLYCOSYLATED HEMOGLOBIN (HGB A1C): Hemoglobin A1C: 6.5 % — AB (ref 4.0–5.6)

## 2023-04-11 NOTE — Progress Notes (Unsigned)
Established Patient Office Visit  Subjective   Patient ID: Gabriella Snyder, female    DOB: 09/01/1975  Age: 47 y.o. MRN: [id number]  Chief Complaint  Patient presents with   Medical Management of Chronic Issues    Follow up    Gabriella Snyder is a 47 year old patient with a     Patient Active Problem List   Diagnosis Date Noted   Morbid obesity (HCC) 02/17/2017   DUB (dysfunctional uterine bleeding) 02/17/2017   Iron deficiency anemia 02/27/2014   Past Medical History:  Diagnosis Date   Anemia    Anxiety    Chronic pain    Degenerative disc disease, cervical    Depression    Heart murmur    "when I was younger; it closed up"   History of blood transfusion 1993   "related to the C-section   Migraine    "a couple times/yr" (02/25/2014)   Muscle spasm    PONV (postoperative nausea and vomiting)    Pre-diabetes    Pyelonephritis 02/25/2014   Past Surgical History:  Procedure Laterality Date   CESAREAN SECTION  1993   DILATION AND CURETTAGE OF UTERUS  2003   DILITATION & CURRETTAGE/HYSTROSCOPY WITH NOVASURE ABLATION N/A 07/12/2022   Procedure: DILATATION & CURETTAGE/HYSTEROSCOPY WITH NOVASURE ABLATION;  Surgeon: Willodean Rosenthal, MD;  Location: [facility];  Service: Gynecology;  Laterality: N/A;   OPEN REDUCTION INTERNAL FIXATION (ORIF) METACARPAL Right 12/12/2019   Procedure: OPEN REDUCTION INTERNAL FIXATION (ORIF) MIDDLE AND DISTAL PHALANX FRACTURE;  Surgeon: Betha Loa, MD;  Location: [facility];  Service: Orthopedics;  Laterality: Right;   TUBAL LIGATION  2011   TUMOR EXCISION Right ~ 2004   "off my back"   Social History   Tobacco Use   Smoking status: Every Day    Current packs/day: 0.25    Average packs/day: 0.3 packs/day for 6.0 years (1.5 ttl pk-yrs)    Types: Cigarettes   Smokeless tobacco: Never  Vaping Use   Vaping status: Never Used  Substance Use Topics   Alcohol use: Not Currently   Drug use: Yes     Types: Marijuana   Social History   Socioeconomic History   Marital status: Married    Spouse name: Not on file   Number of children: Not on file   Years of education: Not on file   Highest education level: Some college, no degree  Occupational History   Not on file  Tobacco Use   Smoking status: Every Day    Current packs/day: 0.25    Average packs/day: 0.3 packs/day for 6.0 years (1.5 ttl pk-yrs)    Types: Cigarettes   Smokeless tobacco: Never  Vaping Use   Vaping status: Never Used  Substance and Sexual Activity   Alcohol use: Not Currently   Drug use: Yes    Types: Marijuana   Sexual activity: Yes    Birth control/protection: Pill  Other Topics Concern   Not on file  Social History Narrative   Not on file   Social Determinants of Health   Financial Resource Strain: Low Risk  (04/10/2023)   Overall Financial Resource Strain (CARDIA)    Difficulty of Paying Living Expenses: Not very hard  Food Insecurity: No Food Insecurity (04/10/2023)   Hunger Vital Sign    Worried About Running Out of Food in the Last Year: Never true    Ran Out of Food in the Last Year: Never true  Transportation Needs: Unmet Transportation  Needs (04/10/2023)   PRAPARE - Administrator, Civil Service (Medical): Yes    Lack of Transportation (Non-Medical): No  Physical Activity: Sufficiently Active (04/10/2023)   Exercise Vital Sign    Days of Exercise per Week: 5 days    Minutes of Exercise per Session: 150+ min  Stress: Stress Concern Present (04/10/2023)   [employer] of Occupational Health - Occupational Stress Questionnaire    Feeling of Stress : Rather much  Social Connections: Moderately Integrated (04/10/2023)   Social Connection and Isolation Panel [NHANES]    Frequency of Communication with Friends and Family: More than three times a week    Frequency of Social Gatherings with Friends and Family: Once a week    Attends Religious Services: More than 4 times per year     Active Member of [employer] or Organizations: No    Attends Engineer, structural: Not on file    Marital Status: Living with partner  Intimate Partner Violence: Unknown (01/31/2023)   Received from [facility]   HITS    Physically Hurt: Not on file    Insult or Talk Down To: Not on file    Threaten Physical Harm: Not on file    Scream or Curse: Not on file   Family Status  Relation Name Status   Mother  Deceased   Father  Alive   MGF  (Not Specified)   PGF  (Not Specified)   Mat Aunt  (Not Specified)   Cousin  (Not Specified)  No partnership data on file   Family History  Problem Relation Age of Onset   Diabetes Mother    Hypertension Father    Cancer Maternal Grandfather    Stroke Paternal Grandfather    Cancer Maternal Aunt        cervical   Cancer Cousin        lymphoma   No Known Allergies    Review of Systems  Constitutional: Negative.   HENT: Negative.    Eyes: Negative.   Respiratory: Negative.    Cardiovascular: Negative.   Gastrointestinal: Negative.   Genitourinary: Negative.   Musculoskeletal:  Positive for back pain and joint pain.  Skin: Negative.   Neurological: Negative.   Psychiatric/Behavioral: Negative.        Objective:     BP 121/85   Pulse 85   Temp (!) 97 F (36.1 C)   Wt 254 lb (115.2 kg)   SpO2 100%   BMI 35.43 kg/m  BP Readings from Last 3 Encounters:  04/11/23 121/85  02/16/23 121/71  01/30/23 124/68   Wt Readings from Last 3 Encounters:  04/11/23 254 lb (115.2 kg)  02/16/23 256 lb (116.1 kg)  11/08/22 257 lb 12.8 oz (116.9 kg)      Physical Exam Constitutional:      Appearance: Normal appearance. She is obese.  Eyes:     Pupils: Pupils are equal, round, and reactive to light.  Cardiovascular:     Rate and Rhythm: Normal rate and regular rhythm.  Pulmonary:     Effort: Pulmonary effort is normal.  Abdominal:     General: Abdomen is flat. Bowel sounds are normal.     Palpations: Abdomen is soft.   Skin:    General: Skin is warm.  Neurological:     General: No focal deficit present.     Mental Status: She is alert. Mental status is at baseline.  Psychiatric:        Mood and Affect:  Mood normal.        Behavior: Behavior normal.        Thought Content: Thought content normal.        Judgment: Judgment normal.      No results found for any visits on 04/11/23.  Last CBC Lab Results  Component Value Date   WBC 9.1 01/27/2023   HGB 13.1 01/27/2023   HCT 39.8 01/27/2023   MCV 79.9 (L) 01/27/2023   MCH 26.3 01/27/2023   RDW 14.5 01/27/2023   PLT 258 01/27/2023   Last metabolic panel Lab Results  Component Value Date   GLUCOSE 133 (H) 01/27/2023   NA 136 01/27/2023   K 4.1 01/27/2023   CL 105 01/27/2023   CO2 22 01/27/2023   BUN 9 01/27/2023   CREATININE 1.03 (H) 01/27/2023   GFRNONAA >60 01/27/2023   CALCIUM 9.5 01/27/2023   PROT 7.7 01/27/2023   ALBUMIN 4.1 01/27/2023   BILITOT 0.5 01/27/2023   ALKPHOS 69 01/27/2023   AST 12 (L) 01/27/2023   ALT 7 01/27/2023   ANIONGAP 9 01/27/2023   Last lipids No results found for: "CHOL", "HDL", "LDLCALC", "LDLDIRECT", "TRIG", "CHOLHDL" Last hemoglobin A1c Lab Results  Component Value Date   HGBA1C 6.6 (A) 11/08/2022   Last thyroid functions Lab Results  Component Value Date   TSH 0.552 03/18/2022   Last vitamin D No results found for: "25OHVITD2", "25OHVITD3", "VD25OH" Last vitamin B12 and Folate No results found for: "VITAMINB12", "FOLATE"    The ASCVD Risk score (Arnett DK, et al., 2019) failed to calculate for the following reasons:   Cannot find a previous HDL lab   Cannot find a previous total cholesterol lab    Assessment & Plan:   Problem List Items Addressed This Visit   None Visit Diagnoses     Prediabetes    -  Primary   Relevant Orders   HgB A1c   CMP and Liver   Lipid Panel   Obesity (BMI 35.0-39.9 without comorbidity)       Beta thalassemia (HCC)       Menorrhagia with irregular  cycle       Relevant Orders   Anemia panel   Screening cholesterol level           Return in about 6 months (around 10/10/2023) for pre-diabetes.   Nolon Nations  APRN, MSN, FNP-C Patient Care [facility] [address] [place], [place] [address] [phone]

## 2023-04-13 LAB — ANEMIA PANEL
Ferritin: 140 ng/mL (ref 15–150)
Folate, Hemolysate: 228 ng/mL
Folate, RBC: 552 ng/mL (ref >498)
Hematocrit: 41.3 % (ref 34.0–46.6)
Iron Saturation: 16 % (ref 15–55)
Iron: 43 ug/dL (ref 27–159)
Retic Ct Pct: 1.2 % (ref 0.6–2.6)
Total Iron Binding Capacity: 269 ug/dL (ref 250–450)
UIBC: 226 ug/dL (ref 131–425)
Vitamin B-12: 308 pg/mL (ref 232–1245)

## 2023-04-13 LAB — CMP AND LIVER
ALT: 8 [IU]/L (ref 0–32)
AST: 11 [IU]/L (ref 0–40)
Albumin: 4.3 g/dL (ref 3.9–4.9)
Alkaline Phosphatase: 86 [IU]/L (ref 44–121)
BUN: 8 mg/dL (ref 6–24)
Bilirubin Total: 0.3 mg/dL (ref 0.0–1.2)
Bilirubin, Direct: 0.09 mg/dL (ref 0.00–0.40)
CO2: 21 mmol/L (ref 20–29)
Calcium: 9.9 mg/dL (ref 8.7–10.2)
Chloride: 106 mmol/L (ref 96–106)
Creatinine, Ser: 1.01 mg/dL — ABNORMAL HIGH (ref 0.57–1.00)
Glucose: 150 mg/dL — ABNORMAL HIGH (ref 70–99)
Potassium: 4.3 mmol/L (ref 3.5–5.2)
Sodium: 141 mmol/L (ref 134–144)
Total Protein: 7.5 g/dL (ref 6.0–8.5)
eGFR: 70 mL/min/{1.73_m2} (ref >59)

## 2023-04-13 LAB — LIPID PANEL
Chol/HDL Ratio: 7 {ratio} — ABNORMAL HIGH (ref 0.0–4.4)
Cholesterol, Total: 231 mg/dL — ABNORMAL HIGH (ref 100–199)
HDL: 33 mg/dL — ABNORMAL LOW (ref >39)
LDL Chol Calc (NIH): 174 mg/dL — ABNORMAL HIGH (ref 0–99)
Triglycerides: 132 mg/dL (ref 0–149)
VLDL Cholesterol Cal: 24 mg/dL (ref 5–40)

## 2023-05-11 ENCOUNTER — Ambulatory Visit: Payer: Medicaid Other | Admitting: Family Medicine

## 2023-05-11 ENCOUNTER — Encounter: Payer: Self-pay | Admitting: Family Medicine

## 2023-05-11 VITALS — BP 120/76 | HR 94 | Temp 98.4°F | Ht 71.5 in | Wt 244.0 lb

## 2023-05-11 DIAGNOSIS — E119 Type 2 diabetes mellitus without complications: Secondary | ICD-10-CM | POA: Insufficient documentation

## 2023-05-11 DIAGNOSIS — E785 Hyperlipidemia, unspecified: Secondary | ICD-10-CM | POA: Diagnosis not present

## 2023-05-11 DIAGNOSIS — Z1211 Encounter for screening for malignant neoplasm of colon: Secondary | ICD-10-CM

## 2023-05-11 DIAGNOSIS — D561 Beta thalassemia: Secondary | ICD-10-CM

## 2023-05-11 DIAGNOSIS — Z7689 Persons encountering health services in other specified circumstances: Secondary | ICD-10-CM

## 2023-05-11 DIAGNOSIS — Z6833 Body mass index (BMI) 33.0-33.9, adult: Secondary | ICD-10-CM

## 2023-05-11 DIAGNOSIS — E66811 Obesity, class 1: Secondary | ICD-10-CM

## 2023-05-11 DIAGNOSIS — F1290 Cannabis use, unspecified, uncomplicated: Secondary | ICD-10-CM

## 2023-05-11 DIAGNOSIS — E1169 Type 2 diabetes mellitus with other specified complication: Secondary | ICD-10-CM

## 2023-05-11 DIAGNOSIS — E6609 Other obesity due to excess calories: Secondary | ICD-10-CM

## 2023-05-11 MED ORDER — ATORVASTATIN CALCIUM 40 MG PO TABS: 40.0000 mg | ORAL_TABLET | Freq: Every day | ORAL | 11 refills | Status: DC

## 2023-05-11 MED ORDER — SEMAGLUTIDE(0.25 OR 0.5MG/DOS) 2 MG/3ML ~~LOC~~ SOPN
0.2500 mg | PEN_INJECTOR | SUBCUTANEOUS | 2 refills | Status: DC
Start: 2023-05-11 — End: 2023-08-09

## 2023-05-11 NOTE — Assessment & Plan Note (Signed)
 She is encouraged to strive for BMI less than 30 to decrease cardiac risk. Advised to aim for at least 150 minutes of exercise per week.

## 2023-05-11 NOTE — Progress Notes (Signed)
 I,Jameka J Llittleton, CMA,acting as a neurosurgeon for [employer], NP.,have documented all relevant documentation on the behalf of [name], NP,as directed by  [name], NP while in the presence of [name], NP.  Subjective:  Patient ID: Gabriella Snyder , female    DOB: Jun 29, 1975 , 48 y.o.   MRN: [id number]  Chief Complaint  Patient presents with   Establish Care   Diabetes    HPI  Patient is a 48 year old female who presents today to establish care. Patient states she had a total hysterectomy on 04/26/2023 at [facility], [place]. She has a diagnosis of diabetes also, she states that she was getting insulin injection when she had surgery. Patient is currently on metformin  500 mg every day, her BS readings in the morning at home have been in the low 100s. Patient is followed by Dr Talitha Repress at [facility] for pain management d/t chronic back pain.     Past Medical History:  Diagnosis Date   Anemia    Anxiety    Beta thalassemia (HCC)    Chronic pain    Degenerative disc disease, cervical    Depression    Heart murmur    when I was younger; it closed up   History of blood transfusion 1993   related to the C-section   Migraine    a couple times/yr (02/25/2014)   Muscle spasm    PONV (postoperative nausea and vomiting)    Pre-diabetes    Pyelonephritis 02/25/2014     Family History  Problem Relation Age of Onset   Diabetes Mother    Kidney disease Mother    Hypertension Father    Cancer Maternal Aunt        cervical   Lung cancer Paternal Aunt    Pancreatic cancer Maternal Grandfather    Colon cancer Paternal Grandmother    Stroke Paternal Grandfather    Seizures Paternal Grandfather    Epilepsy Paternal Grandfather    Cancer Cousin        lymphoma     Current Outpatient Medications:    atorvastatin  (LIPITOR) 40 MG tablet, Take 1 tablet (40 mg total) by mouth daily., Disp: 30 tablet, Rfl: 11   Semaglutide ,0.25 or  0.5MG /DOS, 2 MG/3ML SOPN, Inject 0.25 mg into the skin every 7 (seven) days., Disp: 3 mL, Rfl: 2   DULoxetine (CYMBALTA) 30 MG capsule, Take 90 mg by mouth daily., Disp: , Rfl:    ergocalciferol  (VITAMIN D2) 1.25 MG (50000 UT) capsule, Take 50,000 Units by mouth once a week., Disp: , Rfl:    ibuprofen  (ADVIL ) 800 MG tablet, Take 1 tablet (800 mg total) by mouth every 8 (eight) hours as needed., Disp: 21 tablet, Rfl: 0   lidocaine  (LIDODERM ) 5 %, Place 1 patch onto the skin daily. Remove & Discard patch within 12 hours or as directed by MD (Patient not taking: Reported on 04/11/2023), Disp: 30 patch, Rfl: 0   megestrol  (MEGACE ) 40 MG tablet, Take 1 tablet (40 mg total) by mouth 2 (two) times daily. Can increase to two tablets twice a day in the event of heavy bleeding, Disp: 60 tablet, Rfl: 3   metFORMIN  (GLUCOPHAGE ) 500 MG tablet, Take 1 tablet (500 mg total) by mouth daily with breakfast., Disp: 90 tablet, Rfl: 2   Multiple Vitamin (MULTIVITAMIN) tablet, Take 1 tablet by mouth daily., Disp: , Rfl:    naproxen  (NAPROSYN ) 500 MG tablet, Take 1 tablet (500 mg  total) by mouth 2 (two) times daily. (Patient not taking: Reported on 04/11/2023), Disp: 30 tablet, Rfl: 0   ondansetron  (ZOFRAN -ODT) 4 MG disintegrating tablet, Take 1 tablet (4 mg total) by mouth every 8 (eight) hours as needed for nausea or vomiting., Disp: 20 tablet, Rfl: 0   oxyCODONE  (ROXICODONE ) 15 MG immediate release tablet, Take 15 mg by mouth 4 (four) times daily as needed for pain., Disp: , Rfl:    No Known Allergies   Review of Systems  Constitutional: Negative.   Eyes: Negative.   Respiratory: Negative.    Cardiovascular: Negative.   Musculoskeletal:  Positive for back pain.  Skin: Negative.   Neurological: Negative.   Psychiatric/Behavioral: Negative.       Today's Vitals   05/11/23 1404  BP: 120/76  Pulse: 94  Temp: 98.4 F (36.9 C)  TempSrc: Oral  Weight: 244 lb (110.7 kg)  Height: 5' 11.5 (1.816 m)   Body mass  index is 33.56 kg/m.  Wt Readings from Last 3 Encounters:  05/11/23 244 lb (110.7 kg)  04/11/23 254 lb (115.2 kg)  02/16/23 256 lb (116.1 kg)    The 10-year ASCVD risk score (Arnett DK, et al., 2019) is: 9.6%   Values used to calculate the score:     Age: 65 years     Sex: Female     Is Non-Hispanic African American: Yes     Diabetic: Yes     Tobacco smoker: Yes     Systolic Blood Pressure: 120 mmHg     Is BP treated: No     HDL Cholesterol: 35 mg/dL     Total Cholesterol: 224 mg/dL  Objective:  Physical Exam HENT:     Head: Normocephalic.  Cardiovascular:     Rate and Rhythm: Normal rate.  Abdominal:     General: Bowel sounds are normal.  Musculoskeletal:        General: Tenderness present.  Skin:    General: Skin is warm and dry.  Neurological:     General: No focal deficit present.     Mental Status: She is alert and oriented to person, place, and time.         Assessment And Plan:  Establishing care with new doctor, encounter for  Type 2 diabetes mellitus without complication, unspecified whether long term insulin use (HCC) -     CBC -     CMP14+EGFR -     Hemoglobin A1c -     Microalbumin / creatinine urine ratio -     Semaglutide (0.25 or 0.5MG /DOS); Inject 0.25 mg into the skin every 7 (seven) days.  Dispense: 3 mL; Refill: 2  Beta thalassemia (HCC)  Marijuana smoker  Screening for colon cancer -     Ambulatory referral to Gastroenterology  Dyslipidemia associated with type 2 diabetes mellitus (HCC) -     Lipid panel  Class 1 obesity due to excess calories with body mass index (BMI) of 33.0 to 33.9 in adult, unspecified whether serious comorbidity present Assessment & Plan: She is encouraged to strive for BMI less than 30 to decrease cardiac risk. Advised to aim for at least 150 minutes of exercise per week.    Other orders -     Atorvastatin  Calcium ; Take 1 tablet (40 mg total) by mouth daily.  Dispense: 30 tablet; Refill: 11    Return in  about 3 months (around 08/09/2023) for diabetes.  Patient was given opportunity to ask questions. Patient verbalized understanding of the plan and was  able to repeat key elements of the plan. All questions were answered to their satisfaction.    I, [name], NP, have reviewed all documentation for this visit. The documentation on 05/12/23 for the exam, diagnosis, procedures, and orders are all accurate and complete.   IF YOU HAVE BEEN REFERRED TO A SPECIALIST, IT MAY TAKE 1-2 WEEKS TO SCHEDULE/PROCESS THE REFERRAL. IF YOU HAVE NOT HEARD FROM US /SPECIALIST IN TWO WEEKS, PLEASE GIVE US  A CALL AT [phone].

## 2023-05-12 LAB — CMP14+EGFR
ALT: 8 [IU]/L (ref 0–32)
AST: 14 [IU]/L (ref 0–40)
Albumin: 4.7 g/dL (ref 3.9–4.9)
Alkaline Phosphatase: 100 [IU]/L (ref 44–121)
BUN/Creatinine Ratio: 9 (ref 9–23)
BUN: 10 mg/dL (ref 6–24)
Bilirubin Total: 0.3 mg/dL (ref 0.0–1.2)
CO2: 20 mmol/L (ref 20–29)
Calcium: 10.5 mg/dL — ABNORMAL HIGH (ref 8.7–10.2)
Chloride: 103 mmol/L (ref 96–106)
Creatinine, Ser: 1.11 mg/dL — ABNORMAL HIGH (ref 0.57–1.00)
Globulin, Total: 3.3 g/dL (ref 1.5–4.5)
Glucose: 119 mg/dL — ABNORMAL HIGH (ref 70–99)
Potassium: 4.5 mmol/L (ref 3.5–5.2)
Sodium: 139 mmol/L (ref 134–144)
Total Protein: 8 g/dL (ref 6.0–8.5)
eGFR: 62 mL/min/{1.73_m2} (ref >59)

## 2023-05-12 LAB — CBC
Hematocrit: 44 % (ref 34.0–46.6)
Hemoglobin: 13.8 g/dL (ref 11.1–15.9)
MCH: 25.9 pg — ABNORMAL LOW (ref 26.6–33.0)
MCHC: 31.4 g/dL — ABNORMAL LOW (ref 31.5–35.7)
MCV: 83 fL (ref 79–97)
Platelets: 333 10*3/uL (ref 150–450)
RBC: 5.33 x10E6/uL — ABNORMAL HIGH (ref 3.77–5.28)
RDW: 17.7 % — ABNORMAL HIGH (ref 11.7–15.4)
WBC: 11.3 10*3/uL — ABNORMAL HIGH (ref 3.4–10.8)

## 2023-05-12 LAB — LIPID PANEL
Chol/HDL Ratio: 6.4 {ratio} — ABNORMAL HIGH (ref 0.0–4.4)
Cholesterol, Total: 224 mg/dL — ABNORMAL HIGH (ref 100–199)
HDL: 35 mg/dL — ABNORMAL LOW (ref >39)
LDL Chol Calc (NIH): 163 mg/dL — ABNORMAL HIGH (ref 0–99)
Triglycerides: 143 mg/dL (ref 0–149)
VLDL Cholesterol Cal: 26 mg/dL (ref 5–40)

## 2023-05-12 LAB — MICROALBUMIN / CREATININE URINE RATIO
Creatinine, Urine: 524.6 mg/dL
Microalb/Creat Ratio: 4 mg/g{creat} (ref 0–29)
Microalbumin, Urine: 22.4 ug/mL

## 2023-05-12 LAB — HEMOGLOBIN A1C
Est. average glucose Bld gHb Est-mCnc: 131 mg/dL
Hgb A1c MFr Bld: 6.2 % — ABNORMAL HIGH (ref 4.8–5.6)

## 2023-05-18 ENCOUNTER — Telehealth: Payer: Self-pay

## 2023-05-18 NOTE — Telephone Encounter (Signed)
 Patient's prior Berkley Harvey has been submitted through [employer]. We are waiting on the determination. YL,RMA

## 2023-08-09 ENCOUNTER — Encounter: Payer: Self-pay | Admitting: Family Medicine

## 2023-08-09 ENCOUNTER — Ambulatory Visit: Payer: Medicaid Other | Admitting: Family Medicine

## 2023-08-09 VITALS — BP 120/78 | HR 81 | Temp 97.9°F | Ht 71.5 in | Wt 250.0 lb

## 2023-08-09 DIAGNOSIS — E1169 Type 2 diabetes mellitus with other specified complication: Secondary | ICD-10-CM

## 2023-08-09 DIAGNOSIS — Z6834 Body mass index (BMI) 34.0-34.9, adult: Secondary | ICD-10-CM

## 2023-08-09 DIAGNOSIS — E66811 Obesity, class 1: Secondary | ICD-10-CM | POA: Diagnosis not present

## 2023-08-09 DIAGNOSIS — E1165 Type 2 diabetes mellitus with hyperglycemia: Secondary | ICD-10-CM | POA: Diagnosis not present

## 2023-08-09 DIAGNOSIS — E785 Hyperlipidemia, unspecified: Secondary | ICD-10-CM | POA: Diagnosis not present

## 2023-08-09 DIAGNOSIS — Z1211 Encounter for screening for malignant neoplasm of colon: Secondary | ICD-10-CM

## 2023-08-09 DIAGNOSIS — Z152 Genetic susceptibility to obesity: Secondary | ICD-10-CM

## 2023-08-09 DIAGNOSIS — E8882 Obesity due to disruption of MC4R pathway: Secondary | ICD-10-CM

## 2023-08-09 LAB — CBC WITH DIFFERENTIAL/PLATELET
Basophils Absolute: 0.1 10*3/uL (ref 0.0–0.2)
Basos: 1 %
EOS (ABSOLUTE): 0.1 10*3/uL (ref 0.0–0.4)
Eos: 1 %
Hematocrit: 39.9 % (ref 34.0–46.6)
Hemoglobin: 12.7 g/dL (ref 11.1–15.9)
Immature Grans (Abs): 0 10*3/uL (ref 0.0–0.1)
Immature Granulocytes: 0 %
Lymphocytes Absolute: 3.5 10*3/uL — ABNORMAL HIGH (ref 0.7–3.1)
Lymphs: 32 %
MCH: 26.2 pg — ABNORMAL LOW (ref 26.6–33.0)
MCHC: 31.8 g/dL (ref 31.5–35.7)
MCV: 82 fL (ref 79–97)
Monocytes Absolute: 0.3 10*3/uL (ref 0.1–0.9)
Monocytes: 3 %
Neutrophils Absolute: 6.9 10*3/uL (ref 1.4–7.0)
Neutrophils: 63 %
Platelets: 248 10*3/uL (ref 150–450)
RBC: 4.84 x10E6/uL (ref 3.77–5.28)
RDW: 14.5 % (ref 11.7–15.4)
WBC: 10.9 10*3/uL — ABNORMAL HIGH (ref 3.4–10.8)

## 2023-08-09 LAB — HEMOGLOBIN A1C
Est. average glucose Bld gHb Est-mCnc: 128 mg/dL
Hgb A1c MFr Bld: 6.1 % — ABNORMAL HIGH (ref 4.8–5.6)

## 2023-08-09 MED ORDER — MOUNJARO 2.5 MG/0.5ML ~~LOC~~ SOAJ: 2.5000 mg | SUBCUTANEOUS | 1 refills | Status: DC

## 2023-08-09 NOTE — Patient Instructions (Signed)

## 2023-08-09 NOTE — Progress Notes (Signed)
 I,Jameka J Llittleton, CMA,acting as a Neurosurgeon for [employer], NP.,have documented all relevant documentation on the behalf of Ellender Hose, NP,as directed by  Ellender Hose, NP while in the presence of Ellender Hose, NP.  Subjective:  Patient ID: Gabriella Snyder , female    DOB: 20-Oct-1975 , 48 y.o.   MRN: [id number]  Chief Complaint  Patient presents with   Diabetes    HPI  Patient is a 48 year old female who presents today for a diabetes and hyperlipidemia management.  Patient states that her pain management provider changed her cholesterol medication from Lipitor 40 mg every day to crestor 40 mg every day and Zetia 10 mg every day. She denies having chest pain, shortness of breathe or headaches at this time.She would like for her metformin and ozempic be changed to Trulicity SQ weekly.     Past Medical History:  Diagnosis Date   Anemia    Anxiety    Beta thalassemia (HCC)    Chronic pain    Degenerative disc disease, cervical    Depression    Heart murmur    "when I was younger; it closed up"   History of blood transfusion 1993   "related to the C-section   Migraine    "a couple times/yr" (02/25/2014)   Muscle spasm    PONV (postoperative nausea and vomiting)    Pre-diabetes    Pyelonephritis 02/25/2014     Family History  Problem Relation Age of Onset   Diabetes Mother    Kidney disease Mother    Hypertension Father    Cancer Maternal Aunt        cervical   Lung cancer Paternal Aunt    Pancreatic cancer Maternal Grandfather    Colon cancer Paternal Grandmother    Stroke Paternal Grandfather    Seizures Paternal Grandfather    Epilepsy Paternal Grandfather    Cancer Cousin        lymphoma     Current Outpatient Medications:    Dulaglutide (TRULICITY) 0.75 MG/0.5ML SOAJ, Inject 0.75 mg into the skin once a week., Disp: 2 mL, Rfl: 1   ergocalciferol (VITAMIN D2) 1.25 MG (50000 UT) capsule, Take 50,000 Units by mouth once a week., Disp: , Rfl:    ezetimibe (ZETIA)  10 MG tablet, Take 10 mg by mouth at bedtime., Disp: , Rfl:    Multiple Vitamin (MULTIVITAMIN) tablet, Take 1 tablet by mouth daily., Disp: , Rfl:    ondansetron (ZOFRAN-ODT) 4 MG disintegrating tablet, Take 1 tablet (4 mg total) by mouth every 8 (eight) hours as needed for nausea or vomiting., Disp: 20 tablet, Rfl: 0   oxyCODONE (ROXICODONE) 15 MG immediate release tablet, Take 15 mg by mouth 4 (four) times daily as needed for pain., Disp: , Rfl:    rosuvastatin (CRESTOR) 40 MG tablet, Take 40 mg by mouth daily., Disp: , Rfl:    DULoxetine (CYMBALTA) 30 MG capsule, Take 90 mg by mouth daily. (Patient not taking: Reported on 08/09/2023), Disp: , Rfl:    lidocaine (LIDODERM) 5 %, Place 1 patch onto the skin daily. Remove & Discard patch within 12 hours or as directed by MD (Patient not taking: Reported on 08/09/2023), Disp: 30 patch, Rfl: 0   metFORMIN (GLUCOPHAGE) 500 MG tablet, Take 1 tablet (500 mg total) by mouth daily with breakfast. (Patient not taking: Reported on 08/09/2023), Disp: 90 tablet, Rfl: 2   No Known Allergies   Review of Systems  Constitutional: Negative.   Eyes: Negative.  Cardiovascular: Negative.   Gastrointestinal: Negative.   Endocrine: Negative for polydipsia, polyphagia and polyuria.  Musculoskeletal: Negative.   Skin: Negative.   Neurological: Negative.   Hematological: Negative.   Psychiatric/Behavioral: Negative.       Today's Vitals   08/09/23 1416  BP: 120/78  Pulse: 81  Temp: 97.9 F (36.6 C)  TempSrc: Oral  Weight: 250 lb (113.4 kg)  Height: 5' 11.5" (1.816 m)  PainSc: 0-No pain   Body mass index is 34.38 kg/m.  Wt Readings from Last 3 Encounters:  08/09/23 250 lb (113.4 kg)  05/11/23 244 lb (110.7 kg)  04/11/23 254 lb (115.2 kg)    The 10-year ASCVD risk score (Arnett DK, et al., 2019) is: 9.6%   Values used to calculate the score:     Age: 39 years     Sex: Female     Is Non-Hispanic African American: Yes     Diabetic: Yes     Tobacco  smoker: Yes     Systolic Blood Pressure: 120 mmHg     Is BP treated: No     HDL Cholesterol: 35 mg/dL     Total Cholesterol: 224 mg/dL  Objective:  Physical Exam HENT:     Head: Normocephalic.  Cardiovascular:     Rate and Rhythm: Normal rate and regular rhythm.     Pulses: Normal pulses.  Pulmonary:     Effort: Pulmonary effort is normal.     Breath sounds: Normal breath sounds.  Neurological:     Mental Status: She is alert.         Assessment And Plan:  Dyslipidemia associated with type 2 diabetes mellitus [facility]) Assessment & Plan: Lipid Panel     Component Value Date/Time   CHOL 224 (H) 05/11/2023 1518   TRIG 143 05/11/2023 1518   HDL 35 (L) 05/11/2023 1518   CHOLHDL 6.4 (H) 05/11/2023 1518   LDLCALC 163 (H) 05/11/2023 1518   LABVLDL 26 05/11/2023 1518   Continue zetia 10 mg every day and Crestor 40 mg every day.   Type 2 diabetes mellitus with hyperglycemia, without long-term current use of insulin [facility]) Assessment & Plan: Lab Results  Component Value Date   HGBA1C 6.1 (H) 08/09/2023   HGBA1C 6.2 (H) 05/11/2023   HGBA1C 6.5 (A) 04/11/2023  Trulicilty Once SQ weekly  Orders: -     Hemoglobin A1c -     CBC with Differential/Platelet -     Trulicity; Inject 0.75 mg into the skin once a week.  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 Advised to aim for at least 150 minutes of exercise per week.      Return in 16 weeks (on 11/29/2023) for controlled DM check-4 months.  Patient was given opportunity to ask questions. Patient verbalized understanding of the plan and was able to repeat key elements of the plan. All questions were answered to their satisfaction.   I, Ellender Hose, NP,  have reviewed all documentation for this visit. The documentation on 08/16/2023 for the exam, diagnosis, procedures, and orders are all accurate and complete.   IF YOU HAVE BEEN REFERRED TO A SPECIALIST, IT MAY TAKE 1-2 WEEKS TO SCHEDULE/PROCESS THE REFERRAL.  IF YOU HAVE NOT HEARD FROM US/SPECIALIST IN TWO WEEKS, PLEASE GIVE [place] A CALL AT [phone].

## 2023-08-10 MED ORDER — TRULICITY 0.75 MG/0.5ML ~~LOC~~ SOAJ: 0.7500 mg | SUBCUTANEOUS | 1 refills | Status: DC

## 2023-08-16 ENCOUNTER — Encounter: Payer: Self-pay | Admitting: Family Medicine

## 2023-08-16 DIAGNOSIS — Z6834 Body mass index (BMI) 34.0-34.9, adult: Secondary | ICD-10-CM | POA: Insufficient documentation

## 2023-08-16 DIAGNOSIS — E1165 Type 2 diabetes mellitus with hyperglycemia: Secondary | ICD-10-CM | POA: Insufficient documentation

## 2023-08-16 NOTE — Progress Notes (Signed)
 Low carbs advised and take Trulicity injection SQ weekly, as discussed on the phone  Thanks

## 2023-08-16 NOTE — Assessment & Plan Note (Addendum)
 Lipid Panel     Component Value Date/Time   CHOL 224 (H) 05/11/2023 1518   TRIG 143 05/11/2023 1518   HDL 35 (L) 05/11/2023 1518   CHOLHDL 6.4 (H) 05/11/2023 1518   LDLCALC 163 (H) 05/11/2023 1518   LABVLDL 26 05/11/2023 1518   Continue zetia 10 mg every day and Crestor 40 mg every day.

## 2023-08-16 NOTE — Assessment & Plan Note (Signed)
 Lab Results  Component Value Date   HGBA1C 6.1 (H) 08/09/2023   HGBA1C 6.2 (H) 05/11/2023   HGBA1C 6.5 (A) 04/11/2023  Trulicilty Once SQ weekly

## 2023-08-16 NOTE — Assessment & Plan Note (Signed)
 She is encouraged to strive for BMI less than 30 to decrease cardiac risk. Advised to aim for at least 150 minutes of exercise per week.

## 2023-08-24 ENCOUNTER — Telehealth: Payer: Self-pay

## 2023-08-24 NOTE — Telephone Encounter (Signed)
 Patient notified that her prior auth for trulicity 0.75mg  has been approved and she can pick it up from the pharmacy. YL,RMA

## 2023-08-31 ENCOUNTER — Encounter: Payer: Self-pay | Admitting: Internal Medicine

## 2023-09-13 ENCOUNTER — Ambulatory Visit (AMBULATORY_SURGERY_CENTER)

## 2023-09-13 VITALS — Ht 71.5 in | Wt 249.0 lb

## 2023-09-13 DIAGNOSIS — Z1211 Encounter for screening for malignant neoplasm of colon: Secondary | ICD-10-CM

## 2023-09-13 MED ORDER — NA SULFATE-K SULFATE-MG SULF 17.5-3.13-1.6 GM/177ML PO SOLN: 1.0000 | Freq: Once | ORAL | 0 refills | Status: AC

## 2023-09-13 NOTE — Progress Notes (Signed)
 No egg or soy allergy known to patient  No issues known to pt with past sedation with any surgeries or procedures Patient denies ever being told they had issues or difficulty with intubation  No FH of Malignant Hyperthermia Pt is not on diet pills but does take GLP-1 medication Pt is not on  home 02  Pt is not on blood thinners  Pt states occasional issues with chronic constipation; increases raw vegetables and water to resolve No A fib or A flutter Have any cardiac testing pending--no Ambulates independently

## 2023-09-27 ENCOUNTER — Encounter: Payer: Self-pay | Admitting: Internal Medicine

## 2023-09-27 ENCOUNTER — Ambulatory Visit (AMBULATORY_SURGERY_CENTER): Admitting: Internal Medicine

## 2023-09-27 VITALS — BP 130/73 | HR 70 | Temp 97.9°F | Resp 12 | Ht 71.5 in | Wt 249.0 lb

## 2023-09-27 DIAGNOSIS — K635 Polyp of colon: Secondary | ICD-10-CM | POA: Diagnosis not present

## 2023-09-27 DIAGNOSIS — D125 Benign neoplasm of sigmoid colon: Secondary | ICD-10-CM

## 2023-09-27 DIAGNOSIS — D123 Benign neoplasm of transverse colon: Secondary | ICD-10-CM

## 2023-09-27 DIAGNOSIS — D128 Benign neoplasm of rectum: Secondary | ICD-10-CM

## 2023-09-27 DIAGNOSIS — K648 Other hemorrhoids: Secondary | ICD-10-CM

## 2023-09-27 DIAGNOSIS — D122 Benign neoplasm of ascending colon: Secondary | ICD-10-CM | POA: Diagnosis not present

## 2023-09-27 DIAGNOSIS — Z1211 Encounter for screening for malignant neoplasm of colon: Secondary | ICD-10-CM | POA: Diagnosis not present

## 2023-09-27 DIAGNOSIS — K621 Rectal polyp: Secondary | ICD-10-CM

## 2023-09-27 MED ORDER — SODIUM CHLORIDE 0.9 % IV SOLN: 500.0000 mL | Freq: Once | INTRAVENOUS | Status: DC

## 2023-09-27 NOTE — Progress Notes (Signed)
 Vss nad trans to pacu

## 2023-09-27 NOTE — Op Note (Signed)
 [facility] Patient Name: Gabriella Snyder Procedure Date: 09/27/2023 1:49 PM MRN: [id number] Endoscopist: Pedro Bourgeois , , [id number] Age: 48 Referring MD:  Date of Birth: 09-29-1975 Gender: Female Account #: [id number] Procedure:                Colonoscopy Indications:              Screening for colorectal malignant neoplasm, This                            is the patient's first colonoscopy Medicines:                Monitored Anesthesia Care Procedure:                Pre-Anesthesia Assessment:                           - Prior to the procedure, a History and Physical                            was performed, and patient medications and                            allergies were reviewed. The patient's tolerance of                            previous anesthesia was also reviewed. The risks                            and benefits of the procedure and the sedation                            options and risks were discussed with the patient.                            All questions were answered, and informed consent                            was obtained. Prior Anticoagulants: The patient has                            taken no anticoagulant or antiplatelet agents. ASA                            Grade Assessment: II - A patient with mild systemic                            disease. After reviewing the risks and benefits,                            the patient was deemed in satisfactory condition to                            undergo the procedure.  After obtaining informed consent, the colonoscope                            was passed under direct vision. Throughout the                            procedure, the patient's blood pressure, pulse, and                            oxygen saturations were monitored continuously. The                            CF [id number] #[id number] was introduced through the anus                            and advanced to  the the terminal ileum. The                            colonoscopy was performed without difficulty. The                            patient tolerated the procedure well. The quality                            of the bowel preparation was excellent. The                            terminal ileum, ileocecal valve, appendiceal                            orifice, and rectum were photographed. Scope In: 1:54:50 PM Scope Out: 2:11:18 PM Scope Withdrawal Time: 0 hours 13 minutes 12 seconds  Total Procedure Duration: 0 hours 16 minutes 28 seconds  Findings:                 The terminal ileum appeared normal.                           Two sessile polyps were found in the transverse                            colon and ascending colon. The polyps were 3 to 5                            mm in size. These polyps were removed with a cold                            snare. Resection and retrieval were complete.                           Three sessile polyps were found in the rectum and                            sigmoid colon. The polyps  were 3 to 4 mm in size.                            These polyps were removed with a cold snare.                            Resection and retrieval were complete.                           Non-bleeding internal hemorrhoids were found during                            retroflexion. Complications:            No immediate complications. Estimated Blood Loss:     Estimated blood loss was minimal. Impression:               - The examined portion of the ileum was normal.                           - Two 3 to 5 mm polyps in the transverse colon and                            in the ascending colon, removed with a cold snare.                            Resected and retrieved.                           - Three 3 to 4 mm polyps in the rectum and in the                            sigmoid colon, removed with a cold snare. Resected                            and retrieved.                            - Non-bleeding internal hemorrhoids. Recommendation:           - Discharge patient to home (with escort).                           - Await pathology results.                           - The findings and recommendations were discussed                            with the patient. Dr Pedro Bourgeois "Anastacio Balm" [place],  09/27/2023 2:16:51 PM

## 2023-09-27 NOTE — Progress Notes (Signed)
 GASTROENTEROLOGY PROCEDURE H&P NOTE   Primary Care Physician: Melodie Spry, NP    Reason for Procedure:   Colon cancer screening  Plan:    Colonoscopy  Patient is appropriate for endoscopic procedure(s) in the ambulatory ([facility]) setting.  The nature of the procedure, as well as the risks, benefits, and alternatives were carefully and thoroughly reviewed with the patient. Ample time for discussion and questions allowed. The patient understood, was satisfied, and agreed to proceed.     HPI: Gabriella Snyder is a 48 y.o. female who presents for colonoscopy for colon cancer screening. Denies blood in stools, changes in bowel habits, or unintentional weight loss. Grandmother had colon cancer. This is her first colonoscopy.  Past Medical History:  Diagnosis Date   Anemia    Anxiety    Beta thalassemia (HCC)    Chronic pain    Degenerative disc disease, cervical    Depression    Heart murmur    "when I was younger; it closed up"   History of blood transfusion 1993   "related to the C-section   Migraine    "a couple times/yr" (02/25/2014)   Muscle spasm    PONV (postoperative nausea and vomiting)    Pre-diabetes    Pyelonephritis 02/25/2014    Past Surgical History:  Procedure Laterality Date   ABLATION     CESAREAN SECTION  05/10/1991   DILATION AND CURETTAGE OF UTERUS  05/09/2001   DILITATION & CURRETTAGE/HYSTROSCOPY WITH NOVASURE ABLATION N/A 07/12/2022   Procedure: DILATATION & CURETTAGE/HYSTEROSCOPY WITH NOVASURE ABLATION;  Surgeon: Lenord Radon, MD;  Location: [facility];  Service: Gynecology;  Laterality: N/A;   OPEN REDUCTION INTERNAL FIXATION (ORIF) METACARPAL Right 12/12/2019   Procedure: OPEN REDUCTION INTERNAL FIXATION (ORIF) MIDDLE AND DISTAL PHALANX FRACTURE;  Surgeon: Brunilda Capra, MD;  Location: [facility];  Service: Orthopedics;  Laterality: Right;   PARTIAL HYSTERECTOMY     TUBAL LIGATION  05/09/2009   TUMOR  EXCISION Right ~ 2004   "off my back"    Prior to Admission medications   Medication Sig Start Date End Date Taking? Authorizing Provider  Dulaglutide (TRULICITY) 0.75 MG/0.5ML SOAJ Inject 0.75 mg into the skin once a week. 08/10/23   Melodie Spry, NP  ergocalciferol (VITAMIN D2) 1.25 MG (50000 UT) capsule Take 50,000 Units by mouth once a week.    [provider]  ezetimibe (ZETIA) 10 MG tablet Take 10 mg by mouth at bedtime. 07/31/23   [provider]  ibuprofen  (ADVIL ) 400 MG tablet Take 400 mg by mouth every 6 (six) hours as needed for mild pain (pain score 1-3).    [provider]  Multiple Vitamin (MULTIVITAMIN) tablet Take 1 tablet by mouth daily. Patient not taking: Reported on 09/13/2023    [provider]  oxyCODONE  (ROXICODONE ) 15 MG immediate release tablet Take 15 mg by mouth 4 (four) times daily as needed for pain. 03/04/22   [provider]  rosuvastatin (CRESTOR) 40 MG tablet Take 40 mg by mouth daily.    [provider]    Current Outpatient Medications  Medication Sig Dispense Refill   ergocalciferol (VITAMIN D2) 1.25 MG (50000 UT) capsule Take 50,000 Units by mouth once a week.     ezetimibe (ZETIA) 10 MG tablet Take 10 mg by mouth at bedtime.     oxyCODONE  (ROXICODONE ) 15 MG immediate release tablet Take 15 mg by mouth 4 (four) times daily as needed for pain.     rosuvastatin (  CRESTOR) 40 MG tablet Take 40 mg by mouth daily.     Dulaglutide (TRULICITY) 0.75 MG/0.5ML SOAJ Inject 0.75 mg into the skin once a week. 2 mL 1   ibuprofen  (ADVIL ) 400 MG tablet Take 400 mg by mouth every 6 (six) hours as needed for mild pain (pain score 1-3).     Multiple Vitamin (MULTIVITAMIN) tablet Take 1 tablet by mouth daily. (Patient not taking: Reported on 09/13/2023)     Current Facility-Administered Medications  Medication Dose Route Frequency Provider Last Rate Last Admin   0.9 %  sodium chloride  infusion  500 mL Intravenous Once Daina Drum, MD        Allergies as of 09/27/2023   (No Known Allergies)    Family History  Problem Relation Age of Onset   Diabetes Mother    Kidney disease Mother    Prostate cancer Father    Hypertension Father    Cancer Maternal Aunt        cervical   Lung cancer Paternal Aunt    Prostate cancer Paternal Uncle    Prostate cancer Maternal Grandfather    Pancreatic cancer Maternal Grandfather    Colon cancer Paternal Grandmother    Stroke Paternal Grandfather    Seizures Paternal Grandfather    Epilepsy Paternal Grandfather    Cancer Cousin        lymphoma   Esophageal cancer Neg Hx    Rectal cancer Neg Hx    Stomach cancer Neg Hx     Social History   Socioeconomic History   Marital status: Married    Spouse name: Not on file   Number of children: Not on file   Years of education: Not on file   Highest education level: Some college, no degree  Occupational History   Not on file  Tobacco Use   Smoking status: Every Day    Current packs/day: 0.25    Average packs/day: 0.3 packs/day for 6.0 years (1.5 ttl pk-yrs)    Types: Cigarettes    Passive exposure: Never   Smokeless tobacco: Never  Vaping Use   Vaping status: Never Used  Substance and Sexual Activity   Alcohol use: Not Currently   Drug use: Yes    Frequency: 2.0 times per week    Types: Marijuana   Sexual activity: Yes    Birth control/protection: Surgical    Comment: Hysterectomy  Other Topics Concern   Not on file  Social History Narrative   Not on file   Social Drivers of Health   Financial Resource Strain: Medium Risk (05/10/2023)   Overall Financial Resource Strain (CARDIA)    Difficulty of Paying Living Expenses: Somewhat hard  Food Insecurity: Food Insecurity Present (05/10/2023)   Hunger Vital Sign    Worried About Running Out of Food in the Last Year: Sometimes true    Ran Out of Food in the Last Year: Sometimes true  Transportation Needs: No Transportation Needs (05/10/2023)   PRAPARE -  Administrator, Civil Service (Medical): No    Lack of Transportation (Non-Medical): No  Recent Concern: Transportation Needs - Unmet Transportation Needs (04/10/2023)   PRAPARE - Administrator, Civil Service (Medical): Yes    Lack of Transportation (Non-Medical): No  Physical Activity: Unknown (05/10/2023)   Exercise Vital Sign    Days of Exercise per Week: 5 days    Minutes of Exercise per Session: Patient declined  Stress: Stress Concern Present (05/10/2023)   [employer]  of Occupational Health - Occupational Stress Questionnaire    Feeling of Stress : To some extent  Social Connections: Moderately Integrated (05/10/2023)   Social Connection and Isolation Panel [NHANES]    Frequency of Communication with Friends and Family: More than three times a week    Frequency of Social Gatherings with Friends and Family: Once a week    Attends Religious Services: More than 4 times per year    Active Member of [employer] or Organizations: No    Attends Banker Meetings: Not on file    Marital Status: Living with partner  Intimate Partner Violence: Unknown (01/31/2023)   Received from [facility]   HITS    Physically Hurt: Not on file    Insult or Talk Down To: Not on file    Threaten Physical Harm: Not on file    Scream or Curse: Not on file    Physical Exam: Vital signs in last 24 hours: BP 117/82   Pulse 78   Temp 97.9 F (36.6 C) (Temporal)   Ht 5' 11.5" (1.816 m)   Wt 249 lb (112.9 kg)   LMP 07/19/2022 (Approximate)   SpO2 100%   BMI 34.24 kg/m  GEN: NAD EYE: Sclerae anicteric ENT: MMM CV: Non-tachycardic Pulm: No increased work of breathing GI: Soft, NT/ND NEURO:  Alert & Oriented   Regino Caprio, MD [facility]  09/27/2023 1:52 PM

## 2023-09-27 NOTE — Patient Instructions (Signed)

## 2023-09-27 NOTE — Progress Notes (Signed)
 Pt's states no medical or surgical changes since previsit or office visit.

## 2023-09-27 NOTE — Progress Notes (Signed)
 Called to room to assist during endoscopic procedure.  Patient ID and intended procedure confirmed with present staff. Received instructions for my participation in the procedure from the performing physician.

## 2023-09-28 ENCOUNTER — Telehealth: Payer: Self-pay

## 2023-09-28 NOTE — Telephone Encounter (Signed)
 Left message

## 2023-10-03 ENCOUNTER — Ambulatory Visit: Payer: Self-pay | Admitting: Internal Medicine

## 2023-10-03 LAB — SURGICAL PATHOLOGY

## 2023-10-10 ENCOUNTER — Ambulatory Visit: Payer: Self-pay | Admitting: Nurse Practitioner

## 2023-10-30 ENCOUNTER — Other Ambulatory Visit: Payer: Self-pay | Admitting: Family Medicine

## 2023-10-30 DIAGNOSIS — E1165 Type 2 diabetes mellitus with hyperglycemia: Secondary | ICD-10-CM

## 2023-10-30 MED ORDER — TRULICITY 0.75 MG/0.5ML ~~LOC~~ SOAJ: 0.7500 mg | SUBCUTANEOUS | 1 refills | Status: DC

## 2023-10-30 NOTE — Telephone Encounter (Signed)
 Copied from CRM [id number]. Topic: Clinical - Medication Refill >> Oct 30, 2023  3:09 PM Carlatta H wrote: Medication: Dulaglutide  (TRULICITY ) 0.75 MG/0.5ML Gabriella Snyder [[id number]]  Has the patient contacted their pharmacy? No (Agent: If no, request that the patient contact the pharmacy for the refill. If patient does not wish to contact the pharmacy document the reason why and proceed with request.) (Agent: If yes, when and what did the pharmacy advise?)  This is the patient's preferred pharmacy:  [facility] [place], [place] - [address] [facility] [address] [name] Phone: [phone] Fax: [phone]    Is this the correct pharmacy for this prescription? Yes If no, delete pharmacy and type the correct one.   Has the prescription been filled recently? No  Is the patient out of the medication? Yes  Has the patient been seen for an appointment in the last year OR does the patient have an upcoming appointment?  Yes Can we respond through MyChart? No  Agent: Please be advised that Rx refills may take up to 3 business days. We ask that you follow-up with your pharmacy.

## 2023-10-31 ENCOUNTER — Encounter (HOSPITAL_COMMUNITY): Payer: Self-pay

## 2023-10-31 ENCOUNTER — Emergency Department (HOSPITAL_COMMUNITY)
Admission: EM | Admit: 2023-10-31 | Discharge: 2023-10-31 | Disposition: A | Discharge status: Home / Self Care | Attending: Emergency Medicine | Admitting: Emergency Medicine

## 2023-10-31 ENCOUNTER — Emergency Department (HOSPITAL_COMMUNITY)

## 2023-10-31 DIAGNOSIS — R1013 Epigastric pain: Secondary | ICD-10-CM | POA: Insufficient documentation

## 2023-10-31 DIAGNOSIS — E119 Type 2 diabetes mellitus without complications: Secondary | ICD-10-CM | POA: Insufficient documentation

## 2023-10-31 DIAGNOSIS — R112 Nausea with vomiting, unspecified: Secondary | ICD-10-CM | POA: Diagnosis not present

## 2023-10-31 LAB — CBC
HCT: 42.3 % (ref 36.0–46.0)
Hemoglobin: 14.1 g/dL (ref 12.0–15.0)
MCH: 26.6 pg (ref 26.0–34.0)
MCHC: 33.3 g/dL (ref 30.0–36.0)
MCV: 79.7 fL — ABNORMAL LOW (ref 80.0–100.0)
Platelets: 287 10*3/uL (ref 150–400)
RBC: 5.31 MIL/uL — ABNORMAL HIGH (ref 3.87–5.11)
RDW: 16.3 % — ABNORMAL HIGH (ref 11.5–15.5)
WBC: 8.9 10*3/uL (ref 4.0–10.5)
nRBC: 0 % (ref 0.0–0.2)

## 2023-10-31 LAB — COMPREHENSIVE METABOLIC PANEL WITH GFR
ALT: 11 U/L (ref 0–44)
AST: 21 U/L (ref 15–41)
Albumin: 4.2 g/dL (ref 3.5–5.0)
Alkaline Phosphatase: 71 U/L (ref 38–126)
Anion gap: 13 (ref 5–15)
BUN: 10 mg/dL (ref 6–20)
CO2: 21 mmol/L — ABNORMAL LOW (ref 22–32)
Calcium: 10 mg/dL (ref 8.9–10.3)
Chloride: 105 mmol/L (ref 98–111)
Creatinine, Ser: 1.11 mg/dL — ABNORMAL HIGH (ref 0.44–1.00)
GFR, Estimated: >60 mL/min (ref >60)
Glucose, Bld: 108 mg/dL — ABNORMAL HIGH (ref 70–99)
Potassium: 4.3 mmol/L (ref 3.5–5.1)
Sodium: 139 mmol/L (ref 135–145)
Total Bilirubin: 0.7 mg/dL (ref 0.0–1.2)
Total Protein: 8.5 g/dL — ABNORMAL HIGH (ref 6.5–8.1)

## 2023-10-31 LAB — URINALYSIS, ROUTINE W REFLEX MICROSCOPIC
Bilirubin Urine: NEGATIVE
Glucose, UA: NEGATIVE mg/dL
Hgb urine dipstick: NEGATIVE
Ketones, ur: NEGATIVE mg/dL
Leukocytes,Ua: NEGATIVE
Nitrite: NEGATIVE
Protein, ur: NEGATIVE mg/dL
Specific Gravity, Urine: 1.019 (ref 1.005–1.030)
pH: 8 (ref 5.0–8.0)

## 2023-10-31 LAB — I-STAT CG4 LACTIC ACID, ED
Lactic Acid, Venous: 2 mmol/L (ref 0.5–1.9)
Lactic Acid, Venous: 0.6 mmol/L (ref 0.5–1.9)

## 2023-10-31 LAB — LIPASE, BLOOD: Lipase: 30 U/L (ref 11–51)

## 2023-10-31 MED ORDER — LIDOCAINE VISCOUS HCL 2 % MT SOLN
15.0000 mL | Freq: Once | OROMUCOSAL | Status: AC
  Administered 2023-10-31: 15 mL via ORAL
  Filled 2023-10-31: qty 15

## 2023-10-31 MED ORDER — OXYCODONE-ACETAMINOPHEN 5-325 MG PO TABS: 1.0000 | ORAL_TABLET | Freq: Four times a day (QID) | ORAL | 0 refills | Status: DC | PRN

## 2023-10-31 MED ORDER — HYDROMORPHONE HCL 1 MG/ML IJ SOLN
1.0000 mg | Freq: Once | INTRAMUSCULAR | Status: AC
  Administered 2023-10-31: 1 mg via INTRAVENOUS
  Filled 2023-10-31: qty 1

## 2023-10-31 MED ORDER — ONDANSETRON HCL 4 MG/2ML IJ SOLN
4.0000 mg | Freq: Once | INTRAMUSCULAR | Status: AC | PRN
  Administered 2023-10-31: 4 mg via INTRAVENOUS
  Filled 2023-10-31: qty 2

## 2023-10-31 MED ORDER — OXYCODONE-ACETAMINOPHEN 5-325 MG PO TABS
1.0000 | ORAL_TABLET | Freq: Once | ORAL | Status: AC
  Administered 2023-10-31: 1 via ORAL
  Filled 2023-10-31: qty 1

## 2023-10-31 MED ORDER — IOHEXOL 300 MG/ML  SOLN
100.0000 mL | Freq: Once | INTRAMUSCULAR | Status: AC | PRN
  Administered 2023-10-31: 100 mL via INTRAVENOUS

## 2023-10-31 MED ORDER — SODIUM CHLORIDE (PF) 0.9 % IJ SOLN
INTRAMUSCULAR | Status: AC
  Filled 2023-10-31: qty 50

## 2023-10-31 MED ORDER — OMEPRAZOLE 20 MG PO CPDR: 20.0000 mg | DELAYED_RELEASE_CAPSULE | Freq: Every day | ORAL | 0 refills | Status: DC

## 2023-10-31 MED ORDER — ALUM & MAG HYDROXIDE-SIMETH 200-200-20 MG/5ML PO SUSP
30.0000 mL | Freq: Once | ORAL | Status: AC
  Administered 2023-10-31: 30 mL via ORAL
  Filled 2023-10-31: qty 30

## 2023-10-31 MED ORDER — ONDANSETRON 4 MG PO TBDP: 4.0000 mg | ORAL_TABLET | Freq: Three times a day (TID) | ORAL | 0 refills | Status: DC | PRN

## 2023-10-31 MED ORDER — ONDANSETRON HCL 4 MG/2ML IJ SOLN
4.0000 mg | Freq: Once | INTRAMUSCULAR | Status: AC
  Administered 2023-10-31: 4 mg via INTRAVENOUS
  Filled 2023-10-31: qty 2

## 2023-10-31 NOTE — ED Notes (Signed)
 RN informed patient that we are waiting on CT results patient stating someone needs to come in and give her a f/u RN explained once results finalize provider will be able to; she then stated she will leave AMA RN explained AMA protocol, MD aware

## 2023-10-31 NOTE — ED Notes (Signed)
 Patient transported to CT

## 2023-10-31 NOTE — Discharge Instructions (Signed)
 Your history, exam, workup today did not show an acute surgical cause of your severe abdominal pain with nausea and vomiting.  The CT scan was reassuring and your labs also showed some dehydration but were otherwise reassuring.  After medications helping we feel you are safe for discharge home.  Please rest and stay hydrated.  As we discussed it could be gastritis or irritation with your stomach so please follow-up with GI.  Please use the stomach acid medicine and pain and nausea medicine to help with symptoms.  Please maintain hydration.  If any symptoms change or worsen acutely, please return to the nearest emergency department

## 2023-10-31 NOTE — ED Triage Notes (Signed)
 Pt c/o intermittent upper abdominal pain and n/v starting last night.  Pain score 10/10.  Pt reports taking Oxycodone  and Gas-X this morning w/o relief.    Last BM was yesterday.

## 2023-10-31 NOTE — ED Provider Notes (Signed)
 [facility] AT [facility] Provider Note   CSN: [id number] Arrival date & time: 10/31/23  9062     Patient presents with: Abdominal Pain and Emesis   Gabriella Snyder is a 48 y.o. female.   The history is provided by the patient and medical records. No language interpreter was used.  Abdominal Pain Pain location:  Epigastric and LUQ Pain quality: aching and sharp   Pain radiates to:  Does not radiate Pain severity:  Severe Onset quality:  Gradual Duration:  1 day Timing:  Constant Progression:  Waxing and waning Chronicity:  New Context: not recent illness, not sick contacts, not suspicious food intake and not trauma   Relieved by:  Nothing Worsened by:  Palpation Ineffective treatments:  None tried Associated symptoms: nausea and vomiting   Associated symptoms: no chest pain, no chills, no constipation, no cough, no diarrhea, no dysuria, no fatigue, no fever, no shortness of breath, no vaginal bleeding and no vaginal discharge   Emesis Associated symptoms: abdominal pain   Associated symptoms: no chills, no cough, no diarrhea, no fever and no headaches        Prior to Admission medications   Medication Sig Start Date End Date Taking? Authorizing Provider  Dulaglutide  (TRULICITY ) 0.75 MG/0.5ML SOAJ Inject 0.75 mg into the skin once a week. 10/30/23   Petrina Pries, NP  ergocalciferol (VITAMIN D2) 1.25 MG (50000 UT) capsule Take 50,000 Units by mouth once a week.    [provider]  ezetimibe (ZETIA) 10 MG tablet Take 10 mg by mouth at bedtime. 07/31/23   [provider]  ibuprofen  (ADVIL ) 400 MG tablet Take 400 mg by mouth every 6 (six) hours as needed for mild pain (pain score 1-3).    [provider]  Multiple Vitamin (MULTIVITAMIN) tablet Take 1 tablet by mouth daily. Patient not taking: Reported on 09/13/2023    [provider]  oxyCODONE  (ROXICODONE ) 15 MG immediate release tablet Take 15 mg by mouth 4  (four) times daily as needed for pain. 03/04/22   [provider]  rosuvastatin (CRESTOR) 40 MG tablet Take 40 mg by mouth daily.    [provider]    Allergies: Patient has no known allergies.    Review of Systems  Constitutional:  Negative for chills, fatigue and fever.  HENT:  Negative for congestion.   Respiratory:  Negative for cough, chest tightness, shortness of breath and wheezing.   Cardiovascular:  Negative for chest pain, palpitations and leg swelling.  Gastrointestinal:  Positive for abdominal pain, nausea and vomiting. Negative for abdominal distention, constipation and diarrhea.  Genitourinary:  Negative for dysuria, flank pain, frequency, vaginal bleeding and vaginal discharge.  Musculoskeletal:  Negative for back pain, neck pain and neck stiffness.  Skin:  Negative for rash and wound.  Neurological:  Negative for headaches.  Psychiatric/Behavioral:  Negative for agitation and confusion.   All other systems reviewed and are negative.   Updated Vital Signs LMP 07/19/2022 (Approximate)   Physical Exam Vitals and nursing note reviewed.  Constitutional:      General: She is not in acute distress.    Appearance: She is well-developed. She is not ill-appearing, toxic-appearing or diaphoretic.  HENT:     Head: Normocephalic and atraumatic.     Mouth/Throat:     Mouth: Mucous membranes are dry.   Eyes:     Extraocular Movements: Extraocular movements intact.     Conjunctiva/sclera: Conjunctivae normal.     Pupils: Pupils  are equal, round, and reactive to light.    Cardiovascular:     Rate and Rhythm: Normal rate and regular rhythm.     Heart sounds: No murmur heard. Pulmonary:     Effort: Pulmonary effort is normal. No respiratory distress.     Breath sounds: Normal breath sounds. No wheezing, rhonchi or rales.  Chest:     Chest wall: No tenderness.  Abdominal:     General: Abdomen is flat. Bowel sounds are normal.     Palpations: Abdomen is  soft.     Tenderness: There is abdominal tenderness in the epigastric area and left upper quadrant. There is no right CVA tenderness, left CVA tenderness, guarding or rebound.   Musculoskeletal:        General: No swelling or tenderness.     Cervical back: Neck supple.     Right lower leg: No edema.     Left lower leg: No edema.   Skin:    General: Skin is warm and dry.     Capillary Refill: Capillary refill takes less than 2 seconds.     Coloration: Skin is not pale.     Findings: No erythema or rash.   Neurological:     General: No focal deficit present.     Mental Status: She is alert.   Psychiatric:        Mood and Affect: Mood normal.     (all labs ordered are listed, but only abnormal results are displayed) Labs Reviewed  LIPASE, BLOOD  COMPREHENSIVE METABOLIC PANEL WITH GFR  CBC  URINALYSIS, ROUTINE W REFLEX MICROSCOPIC  I-STAT CG4 LACTIC ACID, ED    EKG: None  Radiology: No results found.   Procedures   Medications Ordered in the ED  ondansetron  (ZOFRAN ) injection 4 mg (has no administration in time range)  HYDROmorphone  (DILAUDID ) injection 1 mg (has no administration in time range)  alum & mag hydroxide-simeth (MAALOX/MYLANTA) 200-200-20 MG/5ML suspension 30 mL (30 mLs Oral Given 10/31/23 0959)    And  lidocaine  (XYLOCAINE ) 2 % viscous mouth solution 15 mL (15 mLs Oral Given 10/31/23 0959)                                    Medical Decision Making Amount and/or Complexity of Data Reviewed Labs: ordered. Radiology: ordered.  Risk OTC drugs. Prescription drug management.    Gabriella Snyder is a 48 y.o. female with a past medical history significant for previous hysterectomy, diabetes, anxiety, depression, and dyslipidemia who presents with abdominal pain.  According to patient, she went to bed feeling normal last night and did not have any greasy or spicy foods and then woke up early this morning with severe pain across her upper abdomen primary  in the left upper and central abdomen.  She reports no trauma and has no rashes or shingles.  She reports nausea and vomiting and dry heaving.  She denies constipation or diarrhea and reports she still passing flatus.  Denies any dysuria or hematuria.  Reports a family history of kidney stones but no known stones or self.  Denies any vaginal complaints of vaginal bleeding or vaginal discharge.  She denies any complications from her previous hysterectomy.  She denies back or flank pain.  Denies history of ulcers or gastritis problems.  Denies any chest pain shortness of breath fevers, or chills.  Denies any sick contacts.  Reports the pain is greater  than 10 out of 10 in severity.  On exam, lungs clear.  Chest nontender.  Abdomen is quite tender in the upper abdomen and left upper abdomen.  Bowel sounds were appreciated.  Flanks and back nontender.  Mucous membranes are dry.  Intact pulses in extremities.  Given the patient's amount of pain, she will get screening labs, urinalysis, and CT imaging.  Will give a GI cocktail as this could be a gastritis or ulcer type pain.  Will give pain medicine, nausea medicine, and some fluids for her dry mucous membranes as well.  Anticipate reassessment after workup to determine disposition.  1:16 PM Workup returned showing some dehydration with elevated lactic acid however the rest of workup did not show acute surgical problem.  Patient's CT scan did not show concerning finding and she was feeling somewhat better after medications.  Will give more pain and some nausea medicine she will be discharged.  She will follow-up with PCP and gave instructions for GI follow-up in case this is gastritis.  She will get a prescription pain medicine, Motrin , and Prilosec.  She agreed with plan of care and follow-up instructions and will be discharged after p.o. challenge.      Final diagnoses:  Epigastric pain  Nausea and vomiting, unspecified vomiting type    ED Discharge  Orders          Ordered    omeprazole (PRILOSEC) 20 MG capsule  Daily        10/31/23 1315    oxyCODONE -acetaminophen  (PERCOCET/ROXICET) 5-325 MG tablet  Every 6 hours PRN        10/31/23 1315    ondansetron  (ZOFRAN -ODT) 4 MG disintegrating tablet  Every 8 hours PRN        10/31/23 1315           Clinical Impression: 1. Epigastric pain   2. Nausea and vomiting, unspecified vomiting type     Disposition: Discharge  Condition: Good  I have discussed the results, Dx and Tx plan with the pt(& family if present). He/she/they expressed understanding and agree(s) with the plan. Discharge instructions discussed at great length. Strict return precautions discussed and pt &/or family have verbalized understanding of the instructions. No further questions at time of discharge.    New Prescriptions   OMEPRAZOLE (PRILOSEC) 20 MG CAPSULE    Take 1 capsule (20 mg total) by mouth daily.   ONDANSETRON  (ZOFRAN -ODT) 4 MG DISINTEGRATING TABLET    Take 1 tablet (4 mg total) by mouth every 8 (eight) hours as needed for nausea or vomiting.   OXYCODONE -ACETAMINOPHEN  (PERCOCET/ROXICET) 5-325 MG TABLET    Take 1 tablet by mouth every 6 (six) hours as needed for severe pain (pain score 7-10).    Follow Up: Petrina Pries, NP [address] [place] [address] [place] [address] [phone]     [facility] [address] [place]  [id number] [phone]        Aldyn Toon, Lonni PARAS, MD 10/31/23 [phone]

## 2023-10-31 NOTE — ED Notes (Signed)
 Patient threw up MAALOX

## 2023-11-07 ENCOUNTER — Telehealth: Payer: Self-pay

## 2023-11-07 NOTE — Telephone Encounter (Signed)
 Called patient to schedule hospital f/u no answer and voicemail is not setup.

## 2023-11-09 ENCOUNTER — Ambulatory Visit: Admitting: Nurse Practitioner

## 2023-11-09 NOTE — Progress Notes (Deleted)
 LILLETTE Kristeen JINNY Gladis, CMA,acting as a Neurosurgeon for Gabriella Ada, FNP.,have documented all relevant documentation on the behalf of Gabriella Ada, FNP,as directed by  Gabriella Ada, FNP while in the presence of Gabriella Ada, FNP.  Subjective:  Patient ID: Gabriella Snyder , female    DOB: Dec 29, 1975 , 48 y.o.   MRN: [id number]  No chief complaint on file.   HPI  HPI   Past Medical History:  Diagnosis Date   Anemia    Anxiety    Beta thalassemia (HCC)    Chronic pain    Degenerative disc disease, cervical    Depression    Heart murmur    when I was younger; it closed up   History of blood transfusion 1993   related to the C-section   Migraine    a couple times/yr (02/25/2014)   Muscle spasm    PONV (postoperative nausea and vomiting)    Pre-diabetes    Pyelonephritis 02/25/2014     Family History  Problem Relation Age of Onset   Diabetes Mother    Kidney disease Mother    Prostate cancer Father    Hypertension Father    Cancer Maternal Aunt        cervical   Lung cancer Paternal Aunt    Prostate cancer Paternal Uncle    Prostate cancer Maternal Grandfather    Pancreatic cancer Maternal Grandfather    Colon cancer Paternal Grandmother    Stroke Paternal Grandfather    Seizures Paternal Grandfather    Epilepsy Paternal Grandfather    Cancer Cousin        lymphoma   Esophageal cancer Neg Hx    Rectal cancer Neg Hx    Stomach cancer Neg Hx      Current Outpatient Medications:    Dulaglutide  (TRULICITY ) 0.75 MG/0.5ML SOAJ, Inject 0.75 mg into the skin once a week., Disp: 2 mL, Rfl: 1   ergocalciferol (VITAMIN D2) 1.25 MG (50000 UT) capsule, Take 50,000 Units by mouth once a week., Disp: , Rfl:    ezetimibe (ZETIA) 10 MG tablet, Take 10 mg by mouth at bedtime., Disp: , Rfl:    ibuprofen  (ADVIL ) 400 MG tablet, Take 400 mg by mouth every 6 (six) hours as needed for mild pain (pain score 1-3)., Disp: , Rfl:    Multiple Vitamin (MULTIVITAMIN) tablet, Take 1 tablet by mouth  daily. (Patient not taking: Reported on 09/13/2023), Disp: , Rfl:    omeprazole  (PRILOSEC) 20 MG capsule, Take 1 capsule (20 mg total) by mouth daily., Disp: 30 capsule, Rfl: 0   ondansetron  (ZOFRAN -ODT) 4 MG disintegrating tablet, Take 1 tablet (4 mg total) by mouth every 8 (eight) hours as needed for nausea or vomiting., Disp: 20 tablet, Rfl: 0   oxyCODONE  (ROXICODONE ) 15 MG immediate release tablet, Take 15 mg by mouth 4 (four) times daily as needed for pain., Disp: , Rfl:    oxyCODONE -acetaminophen  (PERCOCET/ROXICET) 5-325 MG tablet, Take 1 tablet by mouth every 6 (six) hours as needed for severe pain (pain score 7-10)., Disp: 10 tablet, Rfl: 0   rosuvastatin (CRESTOR) 40 MG tablet, Take 40 mg by mouth daily., Disp: , Rfl:    No Known Allergies   Review of Systems   There were no vitals filed for this visit. There is no height or weight on file to calculate BMI.  Wt Readings from Last 3 Encounters:  10/31/23 249 lb (112.9 kg)  09/27/23 249 lb (112.9 kg)  09/13/23 249 lb (112.9 kg)  The 10-year ASCVD risk score (Arnett DK, et al., 2019) is: 14%   Values used to calculate the score:     Age: 79 years     Clincally relevant sex: Female     Is Non-Hispanic African American: Yes     Diabetic: Yes     Tobacco smoker: Yes     Systolic Blood Pressure: 131 mmHg     Is BP treated: No     HDL Cholesterol: 35 mg/dL     Total Cholesterol: 224 mg/dL  Objective:  Physical Exam      Assessment And Plan:  Hospital discharge follow-up  Epigastric pain    No follow-ups on file.  Patient was given opportunity to ask questions. Patient verbalized understanding of the plan and was able to repeat key elements of the plan. All questions were answered to their satisfaction.    LILLETTE Gabriella Ada, FNP, have reviewed all documentation for this visit. The documentation on 11/09/23 for the exam, diagnosis, procedures, and orders are all accurate and complete.   IF YOU HAVE BEEN REFERRED TO A  SPECIALIST, IT MAY TAKE 1-2 WEEKS TO SCHEDULE/PROCESS THE REFERRAL. IF YOU HAVE NOT HEARD FROM US /SPECIALIST IN TWO WEEKS, PLEASE GIVE US  A CALL AT [phone] X [phone].

## 2023-12-13 ENCOUNTER — Ambulatory Visit: Admitting: Family Medicine

## 2023-12-13 VITALS — BP 130/78 | HR 76 | Temp 98.6°F | Ht 71.0 in | Wt 228.0 lb

## 2023-12-13 DIAGNOSIS — R140 Abdominal distension (gaseous): Secondary | ICD-10-CM

## 2023-12-13 DIAGNOSIS — M545 Low back pain, unspecified: Secondary | ICD-10-CM | POA: Insufficient documentation

## 2023-12-13 DIAGNOSIS — E6609 Other obesity due to excess calories: Secondary | ICD-10-CM

## 2023-12-13 DIAGNOSIS — E1169 Type 2 diabetes mellitus with other specified complication: Secondary | ICD-10-CM

## 2023-12-13 DIAGNOSIS — E66811 Obesity, class 1: Secondary | ICD-10-CM

## 2023-12-13 DIAGNOSIS — E1165 Type 2 diabetes mellitus with hyperglycemia: Secondary | ICD-10-CM | POA: Diagnosis not present

## 2023-12-13 DIAGNOSIS — Z6831 Body mass index (BMI) 31.0-31.9, adult: Secondary | ICD-10-CM

## 2023-12-13 DIAGNOSIS — Z2821 Immunization not carried out because of patient refusal: Secondary | ICD-10-CM

## 2023-12-13 DIAGNOSIS — R300 Dysuria: Secondary | ICD-10-CM | POA: Diagnosis not present

## 2023-12-13 DIAGNOSIS — E785 Hyperlipidemia, unspecified: Secondary | ICD-10-CM

## 2023-12-13 DIAGNOSIS — Z720 Tobacco use: Secondary | ICD-10-CM

## 2023-12-13 LAB — POCT URINALYSIS DIP (CLINITEK)
Bilirubin, UA: NEGATIVE — AB
Blood, UA: NEGATIVE
Glucose, UA: NEGATIVE mg/dL
Nitrite, UA: NEGATIVE
POC PROTEIN,UA: 30 — AB
Spec Grav, UA: 1.02 (ref 1.010–1.025)
Urobilinogen, UA: 0.2 U/dL
pH, UA: 7 (ref 5.0–8.0)

## 2023-12-13 MED ORDER — EZETIMIBE 10 MG PO TABS: 10.0000 mg | ORAL_TABLET | Freq: Every day | ORAL | 3 refills | Status: AC

## 2023-12-13 MED ORDER — TRULICITY 0.75 MG/0.5ML ~~LOC~~ SOAJ
0.7500 mg | SUBCUTANEOUS | 2 refills | Status: DC
Start: 2023-12-13 — End: 2024-03-20

## 2023-12-13 MED ORDER — NITROFURANTOIN MONOHYD MACRO 100 MG PO CAPS: 100.0000 mg | ORAL_CAPSULE | Freq: Two times a day (BID) | ORAL | 0 refills | Status: AC

## 2023-12-13 MED ORDER — RESTORA PO CAPS: 1.0000 | ORAL_CAPSULE | Freq: Every day | ORAL | 1 refills | Status: AC

## 2023-12-13 MED ORDER — ROSUVASTATIN CALCIUM 40 MG PO TABS: 40.0000 mg | ORAL_TABLET | Freq: Every day | ORAL | 3 refills | Status: AC

## 2023-12-13 NOTE — Progress Notes (Signed)
 I,Gabriella Snyder, CMA,acting as a Neurosurgeon for [employer], NP.,have documented all relevant documentation on the behalf of Gabriella Creighton, NP,as directed by  Gabriella Creighton, NP while in the presence of Gabriella Creighton, NP.  Subjective:  Patient ID: Gabriella Snyder , female    DOB: 01-01-1976 , 48 y.o.   MRN: [id number]  Chief Complaint  Patient presents with   Diabetes    Patient presents today for diabetes check. Patient reports compliance with her meds. Patient denies having chest pain,sob or headaches at this time.    Discussed the use of AI scribe software for clinical note transcription with the patient, who gave verbal consent to proceed.  History of Present Illness      HPI  Gabriella Snyder is a 48 year old female with diabetes who presents for a follow-up visit.  She has been experiencing significant abdominal pain and discomfort since undergoing a colonoscopy in May. The pain is severe, especially during bowel movements, and began after the procedure. In June, she had an episode of intense pain that required a hospital visit, where she was diagnosed with gastritis. Despite taking Prilosec for heartburn, she continues to experience pain and bloating, particularly with gas that feels 'trapped'. She uses extra strength Gas-X to alleviate the gas.  She has a history of urinary tract infections and reports symptoms suggestive of a UTI, including cloudy urine and discomfort during urination. She has been using over-the-counter medication for relief. She notes that her urine stream is not straight and feels like it does not empty completely. No fever is present.  She is currently taking Trulicity  for diabetes management and reports her last A1c was 6.1. She is also on cholesterol medication and requires a refill. She mentions a previous A1c of 6.5.  She smokes cigarettes and wants to quit, particularly after moving to a new house. She attributes her continued smoking to stress related to  moving and dealing with her landlord.   Diabetes She has type 2 diabetes mellitus. Her disease course has been stable. Pertinent negatives for diabetes include no polydipsia, no polyphagia and no polyuria. There are no hypoglycemic complications. Risk factors for coronary artery disease include dyslipidemia. She is following a generally healthy diet. When asked about meal planning, she reported none. She has not had a previous visit with a dietitian. An ACE inhibitor/angiotensin II receptor blocker is not being taken. She does not see a podiatrist.Eye exam is not current.     Past Medical History:  Diagnosis Date   Anemia    Anxiety    Beta thalassemia (HCC)    Chronic pain    Degenerative disc disease, cervical    Depression    Heart murmur    when I was younger; it closed up   History of blood transfusion 1993   related to the C-section   Migraine    a couple times/yr (02/25/2014)   Muscle spasm    PONV (postoperative nausea and vomiting)    Pre-diabetes    Pyelonephritis 02/25/2014     Family History  Problem Relation Age of Onset   Diabetes Mother    Kidney disease Mother    Prostate cancer Father    Hypertension Father    Cancer Maternal Aunt        cervical   Lung cancer Paternal Aunt    Prostate cancer Paternal Uncle    Prostate cancer Maternal Grandfather    Pancreatic cancer Maternal Grandfather    Colon cancer Paternal  Grandmother    Stroke Paternal Grandfather    Seizures Paternal Grandfather    Epilepsy Paternal Grandfather    Cancer Cousin        lymphoma   Esophageal cancer Neg Hx    Rectal cancer Neg Hx    Stomach cancer Neg Hx      Current Outpatient Medications:    ergocalciferol (VITAMIN D2) 1.25 MG (50000 UT) capsule, Take 50,000 Units by mouth once a week., Disp: , Rfl:    ibuprofen  (ADVIL ) 400 MG tablet, Take 400 mg by mouth every 6 (six) hours as needed for mild pain (pain score 1-3)., Disp: , Rfl:    omeprazole  (PRILOSEC) 20 MG  capsule, Take 1 capsule (20 mg total) by mouth daily., Disp: 30 capsule, Rfl: 0   oxyCODONE  (ROXICODONE ) 15 MG immediate release tablet, Take 15 mg by mouth 4 (four) times daily as needed for pain., Disp: , Rfl:    Probiotic Product (RESTORA) CAPS, Take 1 capsule by mouth daily., Disp: 90 capsule, Rfl: 1   Dulaglutide  (TRULICITY ) 0.75 MG/0.5ML SOAJ, Inject 0.75 mg into the skin once a week., Disp: 2 mL, Rfl: 2   ezetimibe  (ZETIA ) 10 MG tablet, Take 1 tablet (10 mg total) by mouth at bedtime., Disp: 90 tablet, Rfl: 3   ondansetron  (ZOFRAN -ODT) 4 MG disintegrating tablet, Take 1 tablet (4 mg total) by mouth every 8 (eight) hours as needed for nausea or vomiting., Disp: 20 tablet, Rfl: 0   oxyCODONE -acetaminophen  (PERCOCET/ROXICET) 5-325 MG tablet, Take 1 tablet by mouth every 6 (six) hours as needed for severe pain (pain score 7-10)., Disp: 10 tablet, Rfl: 0   rosuvastatin  (CRESTOR ) 40 MG tablet, Take 1 tablet (40 mg total) by mouth daily., Disp: 90 tablet, Rfl: 3   No Known Allergies   Review of Systems  Constitutional: Negative.   Eyes: Negative.   Endocrine: Negative for polydipsia, polyphagia and polyuria.  Genitourinary:  Positive for dysuria and urgency.  Musculoskeletal: Negative.   Skin: Negative.   Psychiatric/Behavioral: Negative.       Today's Vitals   12/13/23 1438  BP: 130/78  Pulse: 76  Temp: 98.6 F (37 C)  TempSrc: Oral  Weight: 228 lb (103.4 kg)  Height: 5' 11 (1.803 m)  PainSc: 4   PainLoc: Back   Body mass index is 31.8 kg/m.  Wt Readings from Last 3 Encounters:  12/13/23 228 lb (103.4 kg)  10/31/23 249 lb (112.9 kg)  09/27/23 249 lb (112.9 kg)    The 10-year ASCVD risk score (Arnett DK, et al., 2019) is: 15%   Values used to calculate the score:     Age: 89 years     Clincally relevant sex: Female     Is Non-Hispanic African American: Yes     Diabetic: Yes     Tobacco smoker: Yes     Systolic Blood Pressure: 130 mmHg     Is BP treated: No     HDL  Cholesterol: 29 mg/dL     Total Cholesterol: 172 mg/dL  Objective:  Physical Exam Pulmonary:     Effort: Pulmonary effort is normal.     Breath sounds: Normal breath sounds.  Abdominal:     Tenderness: There is abdominal tenderness.  Skin:    General: Skin is warm and dry.  Neurological:     Mental Status: She is oriented to person, place, and time.         Assessment And Plan:  Type 2 diabetes mellitus with hyperglycemia, without long-term  current use of insulin (HCC) Assessment & Plan: Type 2 diabetes mellitus with recent HbA1c of 6.1, indicating good glycemic control. - Continue Trulicity  0.75 mg for diabetes management. - Check HbA1c today to monitor glycemic control.  Orders: -     CBC -     CMP14+EGFR -     Hemoglobin A1c -     Trulicity ; Inject 0.75 mg into the skin once a week.  Dispense: 2 mL; Refill: 2  Dyslipidemia associated with type 2 diabetes mellitus (HCC) -     Lipid panel -     Rosuvastatin  Calcium ; Take 1 tablet (40 mg total) by mouth daily.  Dispense: 90 tablet; Refill: 3 -     Ezetimibe ; Take 1 tablet (10 mg total) by mouth at bedtime.  Dispense: 90 tablet; Refill: 3  Pneumococcal vaccination declined  Dysuria Assessment & Plan: Urinary symptoms suggestive of possible UTI, including cloudy urine and dysuria. No fever reported. - Obtain urinalysis to evaluate for urinary tract infection. - If urinalysis confirms UTI, prescribe antibiotics.  Orders: -     POCT URINALYSIS DIP (CLINITEK) -     Urine Culture -     Nitrofurantoin  Monohyd Macro; Take 1 capsule (100 mg total) by mouth 2 (two) times daily for 5 days.  Dispense: 10 capsule; Refill: 0  Abdominal bloating Assessment & Plan: - Recommend starting a probiotic for bloating and gas relief.  Orders: -     Restora; Take 1 capsule by mouth daily.  Dispense: 90 capsule; Refill: 1  Class 1 obesity due to excess calories with serious comorbidity and body mass index (BMI) of 31.0 to 31.9 in  adult Assessment & Plan: Obesity with BMI of 31.8. Discussed recommended BMI of less than 30 for her height of 5'11.   Tobacco use Assessment & Plan: Current smoker. Discussed health impact and cessation strategies. She expressed interest in quitting after moving.   Assessment and Plan Assessment & Plan Type 2 diabetes mellitus   Obesity   Tobacco use - Encourage gradual reduction in cigarette consumption, starting with reducing by one cigarette at a time.  Gastritis with post-colonoscopy abdominal pain and bloating Post-colonoscopy abdominal pain and bloating, possibly related to gastritis. CT scan showed no acute intraabdominal or pelvic pathology.   Urinary symptoms, possible urinary tract infection     Return in 14 weeks (on 03/20/2024) for , diabetes.  Patient was given opportunity to ask questions. Patient verbalized understanding of the plan and was able to repeat key elements of the plan. All questions were answered to their satisfaction.    I, Gabriella Creighton, NP, have reviewed all documentation for this visit. The documentation on 12/20/2023 for the exam, diagnosis, procedures, and orders are all accurate and complete.   IF YOU HAVE BEEN REFERRED TO A SPECIALIST, IT MAY TAKE 1-2 WEEKS TO SCHEDULE/PROCESS THE REFERRAL. IF YOU HAVE NOT HEARD FROM US /SPECIALIST IN TWO WEEKS, PLEASE GIVE US  A CALL AT [phone].

## 2023-12-13 NOTE — Patient Instructions (Signed)

## 2023-12-14 LAB — CBC
Hematocrit: 38.4 % (ref 34.0–46.6)
Hemoglobin: 12.4 g/dL (ref 11.1–15.9)
MCH: 26.4 pg — ABNORMAL LOW (ref 26.6–33.0)
MCHC: 32.3 g/dL (ref 31.5–35.7)
MCV: 82 fL (ref 79–97)
Platelets: 249 x10E3/uL (ref 150–450)
RBC: 4.69 x10E6/uL (ref 3.77–5.28)
RDW: 17 % — ABNORMAL HIGH (ref 11.7–15.4)
WBC: 10.1 x10E3/uL (ref 3.4–10.8)

## 2023-12-14 LAB — HEMOGLOBIN A1C
Est. average glucose Bld gHb Est-mCnc: 111 mg/dL
Hgb A1c MFr Bld: 5.5 % (ref 4.8–5.6)

## 2023-12-14 LAB — LIPID PANEL
Chol/HDL Ratio: 5.9 ratio — ABNORMAL HIGH (ref 0.0–4.4)
Cholesterol, Total: 172 mg/dL (ref 100–199)
HDL: 29 mg/dL — ABNORMAL LOW (ref >39)
LDL Chol Calc (NIH): 116 mg/dL — ABNORMAL HIGH (ref 0–99)
Triglycerides: 151 mg/dL — ABNORMAL HIGH (ref 0–149)
VLDL Cholesterol Cal: 27 mg/dL (ref 5–40)

## 2023-12-14 LAB — CMP14+EGFR
ALT: 6 IU/L (ref 0–32)
AST: 13 IU/L (ref 0–40)
Albumin: 4.2 g/dL (ref 3.9–4.9)
Alkaline Phosphatase: 84 IU/L (ref 44–121)
BUN/Creatinine Ratio: 6 — ABNORMAL LOW (ref 9–23)
BUN: 7 mg/dL (ref 6–24)
Bilirubin Total: 0.3 mg/dL (ref 0.0–1.2)
CO2: 23 mmol/L (ref 20–29)
Calcium: 9.6 mg/dL (ref 8.7–10.2)
Chloride: 102 mmol/L (ref 96–106)
Creatinine, Ser: 1.08 mg/dL — ABNORMAL HIGH (ref 0.57–1.00)
Globulin, Total: 2.8 g/dL (ref 1.5–4.5)
Glucose: 98 mg/dL (ref 70–99)
Potassium: 4.2 mmol/L (ref 3.5–5.2)
Sodium: 138 mmol/L (ref 134–144)
Total Protein: 7 g/dL (ref 6.0–8.5)
eGFR: 65 mL/min/1.73 (ref >59)

## 2023-12-15 ENCOUNTER — Ambulatory Visit: Payer: Self-pay

## 2023-12-15 LAB — URINE CULTURE

## 2023-12-15 NOTE — Telephone Encounter (Signed)
 Message from [place] sent at 12/15/2023  4:33 PM EDT  Patient states she has an antibiotic waiting for her at the pharmacy but she can't take it without taking something for a yeast infection she will develop. She would also like Diflucan  (fluconazole ) called in to her pharmacy.  Callback #: [phone]  Pharmacy: [facility], [place] - [address] [facility] [address] [place] [address] Phone: [phone] Fax: [phone] Hours: Not open 24 hours

## 2023-12-20 ENCOUNTER — Other Ambulatory Visit: Payer: Self-pay

## 2023-12-20 ENCOUNTER — Encounter: Payer: Self-pay | Admitting: Family Medicine

## 2023-12-20 ENCOUNTER — Ambulatory Visit: Payer: Self-pay | Admitting: Family Medicine

## 2023-12-20 DIAGNOSIS — R140 Abdominal distension (gaseous): Secondary | ICD-10-CM | POA: Insufficient documentation

## 2023-12-20 DIAGNOSIS — Z720 Tobacco use: Secondary | ICD-10-CM | POA: Insufficient documentation

## 2023-12-20 MED ORDER — FLUCONAZOLE 150 MG PO TABS: ORAL_TABLET | ORAL | 0 refills | Status: DC

## 2023-12-20 MED ORDER — FLUCONAZOLE 150 MG PO TABS: 150.0000 mg | ORAL_TABLET | Freq: Every day | ORAL | 0 refills | Status: DC

## 2023-12-20 NOTE — Assessment & Plan Note (Signed)
 Urinary symptoms suggestive of possible UTI, including cloudy urine and dysuria. No fever reported. - Obtain urinalysis to evaluate for urinary tract infection. - If urinalysis confirms UTI, prescribe antibiotics.

## 2023-12-20 NOTE — Assessment & Plan Note (Signed)
 Type 2 diabetes mellitus with recent HbA1c of 6.1, indicating good glycemic control. - Continue Trulicity  0.75 mg for diabetes management. - Check HbA1c today to monitor glycemic control.

## 2023-12-20 NOTE — Assessment & Plan Note (Signed)
-   Recommend starting a probiotic for bloating and gas relief.

## 2023-12-20 NOTE — Assessment & Plan Note (Signed)
 Current smoker. Discussed health impact and cessation strategies. She expressed interest in quitting after moving.

## 2023-12-20 NOTE — Assessment & Plan Note (Signed)
 Obesity with BMI of 31.8. Discussed recommended BMI of less than 30 for her height of 5'11.

## 2023-12-20 NOTE — Progress Notes (Signed)
 Your kidney function is stable. Your cholesterol levels are still abnormal, please continue your medication and eat a low fat diet .Your A1c has improved which is good. Do you have symptoms of yeast infection? I sent you one tablet of diflucan  to take by mouth if needed.  Thank you!

## 2024-01-24 ENCOUNTER — Encounter: Payer: Self-pay | Admitting: Family Medicine

## 2024-01-24 ENCOUNTER — Other Ambulatory Visit: Payer: Self-pay | Admitting: Family Medicine

## 2024-01-25 ENCOUNTER — Other Ambulatory Visit: Payer: Self-pay | Admitting: Family Medicine

## 2024-01-25 MED ORDER — ONDANSETRON 4 MG PO TBDP: 4.0000 mg | ORAL_TABLET | Freq: Three times a day (TID) | ORAL | 0 refills | Status: AC | PRN

## 2024-03-13 ENCOUNTER — Other Ambulatory Visit: Payer: Self-pay | Admitting: Medical Genetics

## 2024-03-15 ENCOUNTER — Other Ambulatory Visit (HOSPITAL_COMMUNITY)
Admission: RE | Admit: 2024-03-15 | Discharge: 2024-03-15 | Disposition: A | Discharge status: Home / Self Care | Payer: Self-pay | Source: Ambulatory Visit | Attending: Oncology | Admitting: Oncology

## 2024-03-20 ENCOUNTER — Ambulatory Visit (INDEPENDENT_AMBULATORY_CARE_PROVIDER_SITE_OTHER): Payer: Self-pay | Admitting: Family Medicine

## 2024-03-20 ENCOUNTER — Encounter: Payer: Self-pay | Admitting: Family Medicine

## 2024-03-20 VITALS — BP 116/80 | HR 65 | Temp 98.2°F | Ht 71.0 in | Wt 232.0 lb

## 2024-03-20 DIAGNOSIS — Z6832 Body mass index (BMI) 32.0-32.9, adult: Secondary | ICD-10-CM | POA: Diagnosis not present

## 2024-03-20 DIAGNOSIS — E66811 Obesity, class 1: Secondary | ICD-10-CM

## 2024-03-20 DIAGNOSIS — E1165 Type 2 diabetes mellitus with hyperglycemia: Secondary | ICD-10-CM

## 2024-03-20 DIAGNOSIS — F419 Anxiety disorder, unspecified: Secondary | ICD-10-CM | POA: Diagnosis not present

## 2024-03-20 DIAGNOSIS — F32A Depression, unspecified: Secondary | ICD-10-CM

## 2024-03-20 DIAGNOSIS — E785 Hyperlipidemia, unspecified: Secondary | ICD-10-CM | POA: Diagnosis not present

## 2024-03-20 DIAGNOSIS — E6609 Other obesity due to excess calories: Secondary | ICD-10-CM

## 2024-03-20 DIAGNOSIS — E1169 Type 2 diabetes mellitus with other specified complication: Secondary | ICD-10-CM | POA: Diagnosis not present

## 2024-03-20 DIAGNOSIS — E559 Vitamin D deficiency, unspecified: Secondary | ICD-10-CM

## 2024-03-20 MED ORDER — IBUPROFEN 400 MG PO TABS: 400.0000 mg | ORAL_TABLET | Freq: Four times a day (QID) | ORAL | 1 refills | Status: AC | PRN

## 2024-03-20 NOTE — Patient Instructions (Signed)

## 2024-03-20 NOTE — Progress Notes (Signed)
 I,Jameka J Llittleton, CMA,acting as a neurosurgeon for [employer], NP.,have documented all relevant documentation on the behalf of [name], NP,as directed by  [name], NP while in the presence of [name], NP.  Subjective:  Patient ID: Gabriella Snyder , female    DOB: 07-02-1975 , 48 y.o.   MRN: [id number]  Chief Complaint  Patient presents with   Diabetes    Patient presents today for a diabetes check. Patient reports compliance with her meds. Patient doesn't have any questions or concerns at this time. Patient reports she has been unable to tolerate the trulicity . It is causing her migraines and nauseous.      HPI Discussed the use of AI scribe software for clinical note transcription with the patient, who gave verbal consent to proceed.  History of Present Illness    KENDRIA HALBERG is a 48 year old female with diabetes who presents for a follow-up visit.  She experiences significant side effects from Trulicity , including severe nausea and headaches lasting two to three days post-administration. The nausea is accompanied by cramping, and the headaches resemble migraines, worsened by light exposure.  She has arthritis in her back, which is aggravated by cold weather. An episode where she turned off the heat due to hot flashes resulted in waking up with a stiff back. She goes to pain management at [facility].  She has a history of anxiety and depression, which she describes as mild and not interfering with daily activities. She has experienced anxiety since her son was one year old, approximately 26 years ago, and states that it has remained stable over time.  She is currently taking cholesterol medication and probiotics, but not a multivitamin or vitamin D , as her last vitamin D  levels were normal.   Diabetes She has type 2 diabetes mellitus. Her disease course has been stable. There are no hypoglycemic associated symptoms. Pertinent negatives for hypoglycemia  include no headaches. Pertinent negatives for diabetes include no polydipsia, no polyphagia and no polyuria. There are no hypoglycemic complications. Symptoms are stable. Risk factors for coronary artery disease include dyslipidemia. Her weight is decreasing steadily. She is following a generally healthy diet. When asked about meal planning, she reported none. She has not had a previous visit with a dietitian. Her home blood glucose trend is decreasing rapidly. Her breakfast blood glucose is taken between 9-10 am. Her breakfast blood glucose range is generally 90-110 mg/dl. An ACE inhibitor/angiotensin II receptor blocker is not being taken. She does not see a podiatrist.Eye exam is not current.     Past Medical History:  Diagnosis Date   Anemia    Anxiety    Beta thalassemia (HCC)    Chronic pain    Degenerative disc disease, cervical    Depression    Heart murmur    when I was younger; it closed up   History of blood transfusion 1993   related to the C-section   Migraine    a couple times/yr (02/25/2014)   Muscle spasm    PONV (postoperative nausea and vomiting)    Pre-diabetes    Pyelonephritis 02/25/2014     Family History  Problem Relation Age of Onset   Diabetes Mother    Kidney disease Mother    Prostate cancer Father    Hypertension Father    Cancer Maternal Aunt        cervical   Lung cancer Paternal Aunt    Prostate cancer Paternal Uncle  Prostate cancer Maternal Grandfather    Pancreatic cancer Maternal Grandfather    Colon cancer Paternal Grandmother    Stroke Paternal Grandfather    Seizures Paternal Grandfather    Epilepsy Paternal Grandfather    Cancer Cousin        lymphoma   Esophageal cancer Neg Hx    Rectal cancer Neg Hx    Stomach cancer Neg Hx      Current Outpatient Medications:    ergocalciferol  (VITAMIN D2) 1.25 MG (50000 UT) capsule, Take 50,000 Units by mouth once a week., Disp: , Rfl:    ezetimibe  (ZETIA ) 10 MG tablet, Take 1 tablet  (10 mg total) by mouth at bedtime., Disp: 90 tablet, Rfl: 3   ondansetron  (ZOFRAN -ODT) 4 MG disintegrating tablet, Take 1 tablet (4 mg total) by mouth every 8 (eight) hours as needed for nausea or vomiting., Disp: 20 tablet, Rfl: 0   oxyCODONE  (ROXICODONE ) 15 MG immediate release tablet, Take 15 mg by mouth 4 (four) times daily as needed for pain., Disp: , Rfl:    Probiotic Product (RESTORA) CAPS, Take 1 capsule by mouth daily., Disp: 90 capsule, Rfl: 1   rosuvastatin  (CRESTOR ) 40 MG tablet, Take 1 tablet (40 mg total) by mouth daily., Disp: 90 tablet, Rfl: 3   ibuprofen  (ADVIL ) 400 MG tablet, Take 1 tablet (400 mg total) by mouth every 6 (six) hours as needed for mild pain (pain score 1-3)., Disp: 30 tablet, Rfl: 1   No Known Allergies   Review of Systems  Constitutional: Negative.   Eyes: Negative.   Respiratory: Negative.    Cardiovascular: Negative.   Endocrine: Negative for polydipsia, polyphagia and polyuria.  Musculoskeletal: Negative.   Skin: Negative.   Neurological: Negative.  Negative for light-headedness and headaches.  Psychiatric/Behavioral: Negative.       Today's Vitals   03/20/24 1126  BP: 116/80  Pulse: 65  Temp: 98.2 F (36.8 C)  TempSrc: Oral  Weight: 232 lb (105.2 kg)  Height: 5' 11 (1.803 m)  PainSc: 0-No pain   Body mass index is 32.36 kg/m.  Wt Readings from Last 3 Encounters:  03/20/24 232 lb (105.2 kg)  12/13/23 228 lb (103.4 kg)  10/31/23 249 lb (112.9 kg)    The 10-year ASCVD risk score (Arnett DK, et al., 2019) is: 9.7%   Values used to calculate the score:     Age: 53 years     Clincally relevant sex: Female     Is Non-Hispanic African American: Yes     Diabetic: Yes     Tobacco smoker: Yes     Systolic Blood Pressure: 116 mmHg     Is BP treated: No     HDL Cholesterol: 29 mg/dL     Total Cholesterol: 172 mg/dL  Objective:  Physical Exam Constitutional:      Appearance: Normal appearance.  HENT:     Head: Normocephalic.   Cardiovascular:     Rate and Rhythm: Normal rate and regular rhythm.     Pulses: Normal pulses.     Heart sounds: Normal heart sounds.  Pulmonary:     Effort: Pulmonary effort is normal.     Breath sounds: Normal breath sounds.  Abdominal:     General: Bowel sounds are normal.  Neurological:     Mental Status: She is alert.         Assessment And Plan:   Assessment & Plan Type 2 diabetes mellitus with hyperglycemia, without long-term current use of insulin (HCC) - Discontinued  Trulicity . - Checked A1c today. - Manage diabetes with diet control. Dyslipidemia associated with type 2 diabetes mellitus (HCC) Continue statin therapy Vitamin D  deficiency Check labs Anxiety and depression Mild depression and anxiety, stable symptoms, no interference with daily functioning. Class 1 obesity due to excess calories with serious comorbidity and body mass index (BMI) of 32.0 to 32.9 in adult She is encouraged to strive for BMI less than 30 to decrease cardiac risk. Advised to aim for at least 150 minutes of exercise per week.   Orders Placed This Encounter  Procedures   CBC   Hemoglobin A1c   Basic metabolic panel with GFR   Vitamin D  (25 hydroxy)      Return for controlled DM check-4 months, physical.  Patient was given opportunity to ask questions. Patient verbalized understanding of the plan and was able to repeat key elements of the plan. All questions were answered to their satisfaction.    I, [name], NP, have reviewed all documentation for this visit. The documentation on 03/31/2024 for the exam, diagnosis, procedures, and orders are all accurate and complete.    IF YOU HAVE BEEN REFERRED TO A SPECIALIST, IT MAY TAKE 1-2 WEEKS TO SCHEDULE/PROCESS THE REFERRAL. IF YOU HAVE NOT HEARD FROM US /SPECIALIST IN TWO WEEKS, PLEASE GIVE US  A CALL AT [phone].

## 2024-03-21 LAB — HEMOGLOBIN A1C
Est. average glucose Bld gHb Est-mCnc: 111 mg/dL
Hgb A1c MFr Bld: 5.5 % (ref 4.8–5.6)

## 2024-03-21 LAB — BASIC METABOLIC PANEL WITH GFR
BUN/Creatinine Ratio: 10 (ref 9–23)
BUN: 10 mg/dL (ref 6–24)
CO2: 22 mmol/L (ref 20–29)
Calcium: 9.9 mg/dL (ref 8.7–10.2)
Chloride: 103 mmol/L (ref 96–106)
Creatinine, Ser: 1.04 mg/dL — ABNORMAL HIGH (ref 0.57–1.00)
Glucose: 78 mg/dL (ref 70–99)
Potassium: 4.8 mmol/L (ref 3.5–5.2)
Sodium: 138 mmol/L (ref 134–144)
eGFR: 67 mL/min/1.73 (ref >59)

## 2024-03-21 LAB — CBC
Hematocrit: 44 % (ref 34.0–46.6)
Hemoglobin: 13.7 g/dL (ref 11.1–15.9)
MCH: 26.7 pg (ref 26.6–33.0)
MCHC: 31.1 g/dL — ABNORMAL LOW (ref 31.5–35.7)
MCV: 86 fL (ref 79–97)
Platelets: 248 x10E3/uL (ref 150–450)
RBC: 5.13 x10E6/uL (ref 3.77–5.28)
RDW: 14.6 % (ref 11.7–15.4)
WBC: 10.6 x10E3/uL (ref 3.4–10.8)

## 2024-03-21 LAB — VITAMIN D 25 HYDROXY (VIT D DEFICIENCY, FRACTURES): Vit D, 25-Hydroxy: 19 ng/mL — ABNORMAL LOW (ref 30.0–100.0)

## 2024-03-26 LAB — GENECONNECT MOLECULAR SCREEN: Genetic Analysis Overall Interpretation: NEGATIVE

## 2024-03-31 ENCOUNTER — Encounter: Payer: Self-pay | Admitting: Family Medicine

## 2024-03-31 ENCOUNTER — Ambulatory Visit: Payer: Self-pay | Admitting: Family Medicine

## 2024-03-31 DIAGNOSIS — F419 Anxiety disorder, unspecified: Secondary | ICD-10-CM | POA: Insufficient documentation

## 2024-03-31 DIAGNOSIS — E6609 Other obesity due to excess calories: Secondary | ICD-10-CM | POA: Insufficient documentation

## 2024-03-31 MED ORDER — VITAMIN D (ERGOCALCIFEROL) 1.25 MG (50000 UNIT) PO CAPS: 50000.0000 [IU] | ORAL_CAPSULE | ORAL | 1 refills | Status: AC

## 2024-03-31 NOTE — Assessment & Plan Note (Signed)
 Mild depression and anxiety, stable symptoms, no interference with daily functioning.

## 2024-03-31 NOTE — Assessment & Plan Note (Signed)
 Continue statin therapy

## 2024-03-31 NOTE — Assessment & Plan Note (Signed)
 She is encouraged to strive for BMI less than 30 to decrease cardiac risk. Advised to aim for at least 150 minutes of exercise per week.

## 2024-03-31 NOTE — Progress Notes (Signed)
 Blood count is stable. Electrolytes ,kidney function is stable. Your A1c is 5.5 which is very good, since you could not tolerate Trulicity , so for now control diabetes with your diet. Eat a low carbohydrate diet and exercise too.  Your vitamin D  level is low, 50,000 units of vitamin D  has been sent to the pharmacy. Take one caplet weekly .    Thank you!

## 2024-03-31 NOTE — Assessment & Plan Note (Signed)
-   Discontinued Trulicity . - Checked A1c today. - Manage diabetes with diet control.

## 2024-03-31 NOTE — Assessment & Plan Note (Signed)
 Check labs

## 2024-04-26 ENCOUNTER — Ambulatory Visit: Admitting: Medical

## 2024-04-26 ENCOUNTER — Ambulatory Visit: Payer: Self-pay

## 2024-04-26 VITALS — BP 116/84 | HR 44 | Wt 237.4 lb

## 2024-04-26 DIAGNOSIS — I889 Nonspecific lymphadenitis, unspecified: Secondary | ICD-10-CM | POA: Diagnosis not present

## 2024-04-26 DIAGNOSIS — R112 Nausea with vomiting, unspecified: Secondary | ICD-10-CM | POA: Diagnosis not present

## 2024-04-26 DIAGNOSIS — R519 Headache, unspecified: Secondary | ICD-10-CM | POA: Diagnosis not present

## 2024-04-26 NOTE — Telephone Encounter (Signed)
 Patient scheduled by PAS prior to triage- appt today at 2pm. Denies CP, SOB, or trouble swallowing. Will go to ED if develops. Declines further triage   Copied from CRM [id number]. Topic: Clinical - Red Word Triage >> Apr 26, 2024  9:22 AM Gabriella Snyder wrote: Red Word that prompted transfer to Nurse Triage: Patient is calling to report a bulge on the left side of her neck that is swollen - appear yesterday. With some pain.

## 2024-04-26 NOTE — Progress Notes (Signed)
 "  Name: Gabriella Snyder   Date of Visit: 04/26/2024   Date of last visit with me: Visit date not found   CHIEF COMPLAINT:  Chief Complaint  Patient presents with   Acute Visit    Swollen bulge on neck, pain where the bulge is located. Noticed it last night, pt states that she left like she was catching a cold and that it became a head cold, three days ago experienced nausea, abnormal feeling of the tongue, self covid test was negative.        HPI:  Discussed the use of AI scribe software for clinical note transcription with the patient, who gave verbal consent to proceed.  History of Present Illness   Gabriella Snyder is a 48 year old female who presents with neck swelling and recent upper respiratory symptoms.  Earlier this week, she experienced symptoms resembling the onset of a cold, including an itchy throat. The following day, she felt as though she had a slight cold, but by the next day, her cold symptoms resolved. However, she developed a severe headache, nausea, and vomiting. The vomiting subsided after one day, but nausea persisted mildly.  Last night, while drinking ginger ale, she experienced a tingling sensation under her tongue and difficulty swallowing. Upon massaging her tongue, she noticed a bulge in her neck, which was larger last night but still present this morning. The swelling is painful to touch but not unbearably so. No current cold symptoms such as sore throat, runny nose, sneezing, or ear pain. No fever in the past few days. She has a history of an abscess on her tonsils that required hospitalization.  She has been taking ibuprofen  for headaches, which lasted for two to three days but have since resolved. She denies any recent exposure to sick individuals, particularly children.  She smokes but does not chew tobacco. She maintains good oral hygiene and has no history of frequent cavities. Family history includes thyroid  issues, with a cousin having Graves'  disease.  She has about a 17 year smoking history.   No other aggravating or relieving factors. No other complaint.   Past Medical History:  Diagnosis Date   Anemia    Anxiety    Beta thalassemia (HCC)    Chronic pain    Degenerative disc disease, cervical    Depression    Heart murmur    when I was younger; it closed up   History of blood transfusion 1993   related to the C-section   Migraine    a couple times/yr (02/25/2014)   Muscle spasm    PONV (postoperative nausea and vomiting)    Pre-diabetes    Pyelonephritis 02/25/2014    Medications Ordered Prior to Encounter[1]  ROS as in subjective    OBJECTIVE:    BP 116/84   Pulse (!) 44   Wt 237 lb 6.4 oz (107.7 kg)   LMP 07/19/2022   SpO2 98%   BMI 33.11 kg/m   Wt Readings from Last 3 Encounters:  04/26/24 237 lb 6.4 oz (107.7 kg)  03/20/24 232 lb (105.2 kg)  12/13/23 228 lb (103.4 kg)   BP Readings from Last 3 Encounters:  04/26/24 116/84  03/20/24 116/80  12/13/23 130/78   General appearence: alert, no distress, WD/WN,  HEENT: normocephalic, sclerae anicteric, TMs pearly, nares patent, no discharge or erythema, pharynx normal Oral cavity: There seems to be some slight swelling of the mucosal mucosa on the left but no obvious tooth decay or  tooth lesion or other lesion, MMM Neck: supple, there is tender somewhat enlarged lymph node in the submandibular left area and a similar size tender swollen lymph node in the anterior cervical region adjacent to the submandibular node, both inflamed but not particularly enlarged out of the ordinary, otherwise no thyromegaly, no masses Heart: RRR, normal S1, S2, no murmurs Lungs: CTA bilaterally, no wheezes, rhonchi, or rales No obvious supraclavicular lymph nodes palpable     ASSESSMENT/PLAN:   Encounter Diagnoses  Name Primary?   Lymphadenitis Yes   Nausea and vomiting, unspecified vomiting type    Nonintractable headache, unspecified chronicity pattern,  unspecified headache type     We discussed symptoms and findings.  I suspect 1 of 2 possibilities, either viral syndrome or possibly even a backed up salivary gland.  She does not actually feel sick today other than the swelling in her neck.  All of her other symptoms from earlier this week have resolved  Advise she suck on some lemon drops or sour candy the next 2 days to see if that releases any pressure inside the gum or mouth  Advise good water intake over the next few days.  She has ibuprofen  800 mg tablets at home.  Advised to use ibuprofen  800 milligram (which she has at home already )twice daily for the next 4 to 5 days for lymphadenitis  She can use some extra vitamins such as over-the-counter EmergenC for the next few days along with natural anti-inflammatories including berries such as strawberries and blueberries and blackberries for the next few days.  Continue good oral hygiene  Advise she make significant efforts to quit smoking.  Discussed lung cancer screening going forward  If symptoms with the lymph nodes do not improve over the next 2 weeks or if symptoms persist, advised CBC and possible imaging.  She declined CBC today.  Will use a watch and wait approach.  Follow-up with your PCP in the next 3 to 4 weeks if persistent symptoms  Gabriella Snyder was seen today for acute visit.  Diagnoses and all orders for this visit:  Lymphadenitis  Nausea and vomiting, unspecified vomiting type  Nonintractable headache, unspecified chronicity pattern, unspecified headache type   F/u prn  [facility]    [1]  Current Outpatient Medications on File Prior to Visit  Medication Sig Dispense Refill   ezetimibe  (ZETIA ) 10 MG tablet Take 1 tablet (10 mg total) by mouth at bedtime. 90 tablet 3   ibuprofen  (ADVIL ) 400 MG tablet Take 1 tablet (400 mg total) by mouth every 6 (six) hours as needed for mild pain (pain score 1-3). 30 tablet 1    ondansetron  (ZOFRAN -ODT) 4 MG disintegrating tablet Take 1 tablet (4 mg total) by mouth every 8 (eight) hours as needed for nausea or vomiting. 20 tablet 0   oxyCODONE  (ROXICODONE ) 15 MG immediate release tablet Take 15 mg by mouth 4 (four) times daily as needed for pain.     Probiotic Product (RESTORA) CAPS Take 1 capsule by mouth daily. 90 capsule 1   rosuvastatin  (CRESTOR ) 40 MG tablet Take 1 tablet (40 mg total) by mouth daily. 90 tablet 3   Vitamin D , Ergocalciferol , (DRISDOL ) 1.25 MG (50000 UNIT) CAPS capsule Take 1 capsule (50,000 Units total) by mouth every 7 (seven) days. (Patient not taking: Reported on 04/26/2024) 12 capsule 1   No current facility-administered medications on file prior to visit.   "

## 2024-04-28 ENCOUNTER — Emergency Department (HOSPITAL_COMMUNITY)

## 2024-04-28 ENCOUNTER — Other Ambulatory Visit: Payer: Self-pay

## 2024-04-28 ENCOUNTER — Encounter (HOSPITAL_COMMUNITY): Payer: Self-pay | Admitting: Emergency Medicine

## 2024-04-28 ENCOUNTER — Emergency Department (HOSPITAL_COMMUNITY)
Admission: EM | Admit: 2024-04-28 | Discharge: 2024-04-28 | Disposition: A | Discharge status: Home / Self Care | Source: Home / Self Care | Attending: Emergency Medicine | Admitting: Emergency Medicine

## 2024-04-28 DIAGNOSIS — K1120 Sialoadenitis, unspecified: Secondary | ICD-10-CM | POA: Diagnosis not present

## 2024-04-28 DIAGNOSIS — M542 Cervicalgia: Secondary | ICD-10-CM | POA: Diagnosis present

## 2024-04-28 LAB — CBC
HCT: 40.8 % (ref 36.0–46.0)
Hemoglobin: 13.1 g/dL (ref 12.0–15.0)
MCH: 26.4 pg (ref 26.0–34.0)
MCHC: 32.1 g/dL (ref 30.0–36.0)
MCV: 82.3 fL (ref 80.0–100.0)
Platelets: 224 K/uL (ref 150–400)
RBC: 4.96 MIL/uL (ref 3.87–5.11)
RDW: 14.6 % (ref 11.5–15.5)
WBC: 9.6 K/uL (ref 4.0–10.5)
nRBC: 0 % (ref 0.0–0.2)

## 2024-04-28 LAB — BASIC METABOLIC PANEL WITH GFR
Anion gap: 8 (ref 5–15)
BUN: 10 mg/dL (ref 6–20)
CO2: 24 mmol/L (ref 22–32)
Calcium: 9.8 mg/dL (ref 8.9–10.3)
Chloride: 105 mmol/L (ref 98–111)
Creatinine, Ser: 1 mg/dL (ref 0.44–1.00)
GFR, Estimated: >60 mL/min
Glucose, Bld: 109 mg/dL — ABNORMAL HIGH (ref 70–99)
Potassium: 4.3 mmol/L (ref 3.5–5.1)
Sodium: 137 mmol/L (ref 135–145)

## 2024-04-28 MED ORDER — FENTANYL CITRATE (PF) 50 MCG/ML IJ SOSY
50.0000 ug | PREFILLED_SYRINGE | Freq: Once | INTRAMUSCULAR | Status: AC
  Administered 2024-04-28: 50 ug via INTRAVENOUS
  Filled 2024-04-28: qty 1

## 2024-04-28 MED ORDER — MORPHINE SULFATE (PF) 4 MG/ML IV SOLN
4.0000 mg | Freq: Once | INTRAVENOUS | Status: AC
  Administered 2024-04-28: 4 mg via INTRAVENOUS
  Filled 2024-04-28: qty 1

## 2024-04-28 MED ORDER — FLUCONAZOLE 150 MG PO TABS: 150.0000 mg | ORAL_TABLET | Freq: Once | ORAL | 0 refills | Status: AC

## 2024-04-28 MED ORDER — CLINDAMYCIN HCL 150 MG PO CAPS
150.0000 mg | ORAL_CAPSULE | Freq: Once | ORAL | Status: AC
  Administered 2024-04-28: 150 mg via ORAL
  Filled 2024-04-28: qty 1

## 2024-04-28 MED ORDER — PREDNISONE 20 MG PO TABS: 20.0000 mg | ORAL_TABLET | Freq: Every day | ORAL | 0 refills | Status: AC

## 2024-04-28 MED ORDER — PREDNISONE 20 MG PO TABS
20.0000 mg | ORAL_TABLET | Freq: Once | ORAL | Status: AC
  Administered 2024-04-28: 20 mg via ORAL
  Filled 2024-04-28: qty 1

## 2024-04-28 MED ORDER — OXYCODONE-ACETAMINOPHEN 5-325 MG PO TABS: 1.0000 | ORAL_TABLET | Freq: Four times a day (QID) | ORAL | 0 refills | Status: AC | PRN

## 2024-04-28 MED ORDER — IOHEXOL 300 MG/ML  SOLN
75.0000 mL | Freq: Once | INTRAMUSCULAR | Status: AC | PRN
  Administered 2024-04-28: 75 mL via INTRAVENOUS

## 2024-04-28 MED ORDER — CLINDAMYCIN HCL 150 MG PO CAPS: 150.0000 mg | ORAL_CAPSULE | Freq: Four times a day (QID) | ORAL | 0 refills | Status: AC

## 2024-04-28 NOTE — ED Provider Notes (Signed)
 " [place] EMERGENCY DEPARTMENT AT [facility] Provider Note   CSN: [id number] Arrival date & time: 04/28/24  1509     Patient presents with: Neck Pain   Gabriella Snyder is a 48 y.o. female.  She has no significant past medical history.  Complaining of a painful lump under her left jaw has been going on for 3 to 4 days.  Worse after eating.  Feels  discomfort when swallowing.  No dental problem no fever.  No trauma.  Saw PCP who told her to try sour candy.  Symptoms getting worse.  {Add pertinent medical, surgical, social history, OB history to YEP:67052} The history is provided by the patient.  Neck Pain Pain location:  L side Quality:  Aching Pain severity:  Moderate Onset quality:  Gradual Duration:  4 days Timing:  Constant Progression:  Worsening Chronicity:  New Relieved by:  Nothing Associated symptoms: no fever, no numbness, no syncope, no visual change and no weakness        Prior to Admission medications  Medication Sig Start Date End Date Taking? Authorizing Provider  ezetimibe  (ZETIA ) 10 MG tablet Take 1 tablet (10 mg total) by mouth at bedtime. 12/13/23   Petrina Pries, NP  ibuprofen  (ADVIL ) 400 MG tablet Take 1 tablet (400 mg total) by mouth every 6 (six) hours as needed for mild pain (pain score 1-3). 03/20/24   Petrina Pries, NP  ondansetron  (ZOFRAN -ODT) 4 MG disintegrating tablet Take 1 tablet (4 mg total) by mouth every 8 (eight) hours as needed for nausea or vomiting. 01/25/24   Petrina Pries, NP  oxyCODONE  (ROXICODONE ) 15 MG immediate release tablet Take 15 mg by mouth 4 (four) times daily as needed for pain. 03/04/22   [provider]  Probiotic Product (RESTORA) CAPS Take 1 capsule by mouth daily. 12/13/23   Petrina Pries, NP  rosuvastatin  (CRESTOR ) 40 MG tablet Take 1 tablet (40 mg total) by mouth daily. 12/13/23   Petrina Pries, NP  Vitamin D , Ergocalciferol , (DRISDOL ) 1.25 MG (50000 UNIT) CAPS capsule Take 1 capsule (50,000 Units total) by  mouth every 7 (seven) days. Patient not taking: Reported on 04/26/2024 03/31/24   Petrina Pries, NP    Allergies: Patient has no known allergies.    Review of Systems  Constitutional:  Negative for fever.  Cardiovascular:  Negative for syncope.  Musculoskeletal:  Positive for neck pain.  Neurological:  Negative for weakness and numbness.    Updated Vital Signs BP (!) 132/104 (BP Location: Left Arm)   Pulse 63   Temp 98.3 F (36.8 C) (Oral)   Resp 16   LMP 07/19/2022   SpO2 100%   Physical Exam Vitals and nursing note reviewed.  Constitutional:      General: She is not in acute distress.    Appearance: Normal appearance. She is well-developed.  HENT:     Head: Normocephalic and atraumatic.     Right Ear: Tympanic membrane and ear canal normal.     Left Ear: Tympanic membrane and ear canal normal.     Nose: Nose normal.     Mouth/Throat:     Mouth: Mucous membranes are moist.     Pharynx: Oropharynx is clear.  Eyes:     Conjunctiva/sclera: Conjunctivae normal.  Neck:   Cardiovascular:     Rate and Rhythm: Normal rate and regular rhythm.     Heart sounds: No murmur heard. Pulmonary:     Effort: Pulmonary effort is normal. No respiratory distress.  Breath sounds: Normal breath sounds. No stridor. No wheezing.  Abdominal:     Palpations: Abdomen is soft.     Tenderness: There is no abdominal tenderness. There is no guarding or rebound.  Musculoskeletal:        General: No deformity. Normal range of motion.     Cervical back: Neck supple. Tenderness present.  Skin:    General: Skin is warm and dry.  Neurological:     General: No focal deficit present.     Mental Status: She is alert.     GCS: GCS eye subscore is 4. GCS verbal subscore is 5. GCS motor subscore is 6.     (all labs ordered are listed, but only abnormal results are displayed) Labs Reviewed  BASIC METABOLIC PANEL WITH GFR - Abnormal; Notable for the following components:      Result Value    Glucose, Bld 109 (*)    All other components within normal limits  CBC    EKG: None  Radiology: No results found.  {Document cardiac monitor, telemetry assessment procedure when appropriate:32947} Procedures   Medications Ordered in the ED  fentaNYL  (SUBLIMAZE ) injection 50 mcg (has no administration in time range)      {Click here for ABCD2, HEART and other calculators REFRESH Note before signing:1}                              Medical Decision Making Amount and/or Complexity of Data Reviewed Labs: ordered. Radiology: ordered.  Risk Prescription drug management.   This patient complains of ***; this involves an extensive number of treatment Options and is a complaint that carries with it a high risk of complications and morbidity. The differential includes ***  I ordered, reviewed and interpreted labs, which included *** I ordered medication *** and reviewed PMP when indicated. I ordered imaging studies which included *** and I independently    visualized and interpreted imaging which showed *** Additional history obtained from *** Previous records obtained and reviewed *** I consulted *** and discussed lab and imaging findings and discussed disposition.  Cardiac monitoring reviewed, *** Social determinants considered, *** Critical Interventions: ***  After the interventions stated above, I reevaluated the patient and found *** Admission and further testing considered, ***   {Document critical care time when appropriate  Document review of labs and clinical decision tools ie CHADS2VASC2, etc  Document your independent review of radiology images and any outside records  Document your discussion with family members, caretakers and with consultants  Document social determinants of health affecting pt's care  Document your decision making why or why not admission, treatments were needed:32947:::1}   Final diagnoses:  None    ED Discharge Orders     None         "

## 2024-04-28 NOTE — ED Triage Notes (Signed)
 Patient c/o neck pain and swelling x 5 days. Patient report worsening pain and swelling today.. Patient report he was seen by PCP last Friday and advise if swelling getting worse she need to be seen in ED. Patient report nausea, denies vomiting. Patient denies fever.

## 2024-04-28 NOTE — Discharge Instructions (Signed)
 Please continue hard sour candy and massaging the area.  Antibiotics and steroids for 1 week.  Pain medicine as needed.  Contact Dr. Tobie ENT for follow-up.  Return if any worsening or concerning symptoms

## 2024-05-06 ENCOUNTER — Ambulatory Visit (INDEPENDENT_AMBULATORY_CARE_PROVIDER_SITE_OTHER): Admitting: Otolaryngology

## 2024-05-06 ENCOUNTER — Encounter (INDEPENDENT_AMBULATORY_CARE_PROVIDER_SITE_OTHER): Payer: Self-pay | Admitting: Otolaryngology

## 2024-05-06 VITALS — BP 128/80 | HR 71 | Ht 71.0 in | Wt 236.0 lb

## 2024-05-06 DIAGNOSIS — F17200 Nicotine dependence, unspecified, uncomplicated: Secondary | ICD-10-CM | POA: Diagnosis not present

## 2024-05-06 DIAGNOSIS — K115 Sialolithiasis: Secondary | ICD-10-CM | POA: Diagnosis not present

## 2024-05-06 MED ORDER — AMOXICILLIN-POT CLAVULANATE 875-125 MG PO TABS: 1.0000 | ORAL_TABLET | Freq: Two times a day (BID) | ORAL | 0 refills | Status: AC

## 2024-05-06 NOTE — Patient Instructions (Signed)
 Finish current antibiotics; then take augmentin twice daily for 10 days with food

## 2024-05-06 NOTE — Progress Notes (Signed)
 Dear Dr. Petrina, Here is my assessment for our mutual patient, Gabriella Snyder. Thank you for allowing me the opportunity to care for your patient. Please do not hesitate to contact me should you have any other questions. Sincerely, Dr. Eldora Blanch  Otolaryngology Clinic Note Referring provider: Dr. Petrina HPI:  Gabriella Snyder is a 48 y.o. female kindly referred by Dr. Petrina for evaluation of submandibular gland sialolithiasis  Initial visit (04/2024): Discussed the use of AI scribe software for clinical note transcription with the patient, who gave verbal consent to proceed.  History of Present Illness CHEVY VIRGO is a 48 year old female with beta thalassemia who presents with painful swelling of the tongue and jaw following meals.  Eight days before presentation she had brief rhinorrhea from a presumed viral upper respiratory infection, followed by persistent nausea and one episode of emesis, after which she noted tongue swelling and a big lump over left SMG after eating.  Over subsequent days the swelling became increasingly painful, with pain localized to the jaw and radiating to the inferior ear, causing significant tenderness and difficulty eating, while drinking does not worsen symptoms.  Went to ED, dx with SMG sialolihiasis and Rx abx/steroids; Now nearing completion of a clindamycin  course, with a completed corticosteroid course, and continues sialogogues, massage, and warm compresses every few hours, especially with meals.  She denies fever, weight loss, new cervical masses, prior similar episodes, otolaryngologic surgery, taste changes, or foul tasting drainage and notes the contralateral neck is normal. She does smoke.  Patient otherwise denies: - dysphagia, odynophagia, unintentional weight loss - changes in voice, shortness of breath, hemoptysis - other neck masses  ENT Surgery: no Personal or FHx of bleeding dz or anesthesia difficulty: no  AP/AC:  no  Tobacco: yes  PMHx: HLD, T2DM, Beta thalassemia; GAD/MDD  Independent Review of Additional Tests or Records:  ED Notes 04/28/2024 Dr. Towana: noted left neck painful lump worse after eating, only tried sour candy; Dx: Sialadenitis; Rx: Clinda, Pred, ref to ENT Labs CBC and CMP 04/28/2024: BUN/Cr 10/1; WBC 9.6 CT Neck 04/28/2024 independently interpreted: left submandibular gland orifice stone, with left submandibular sialadenitis; b/l level 2 LN, likely reactive in setting of this PMH/Meds/All/SocHx/FamHx/ROS:   Past Medical History:  Diagnosis Date   Anemia    Anxiety    Beta thalassemia (HCC)    Chronic pain    Degenerative disc disease, cervical    Depression    Heart murmur    when I was younger; it closed up   History of blood transfusion 1993   related to the C-section   Migraine    a couple times/yr (02/25/2014)   Muscle spasm    PONV (postoperative nausea and vomiting)    Pre-diabetes    Pyelonephritis 02/25/2014     Past Surgical History:  Procedure Laterality Date   ABLATION     CESAREAN SECTION  05/10/1991   DILATION AND CURETTAGE OF UTERUS  05/09/2001   DILITATION & CURRETTAGE/HYSTROSCOPY WITH NOVASURE ABLATION N/A 07/12/2022   Procedure: DILATATION & CURETTAGE/HYSTEROSCOPY WITH NOVASURE ABLATION;  Surgeon: Corene Coy, MD;  Location: [facility];  Service: Gynecology;  Laterality: N/A;   OPEN REDUCTION INTERNAL FIXATION (ORIF) METACARPAL Right 12/12/2019   Procedure: OPEN REDUCTION INTERNAL FIXATION (ORIF) MIDDLE AND DISTAL PHALANX FRACTURE;  Surgeon: Murrell Drivers, MD;  Location: [facility];  Service: Orthopedics;  Laterality: Right;   PARTIAL HYSTERECTOMY     TUBAL LIGATION  05/09/2009   TUMOR EXCISION Right ~  2004   off my back    Family History  Problem Relation Age of Onset   Diabetes Mother    Kidney disease Mother    Prostate cancer Father    Hypertension Father    Cancer Maternal Aunt         cervical   Lung cancer Paternal Aunt    Prostate cancer Paternal Uncle    Prostate cancer Maternal Grandfather    Pancreatic cancer Maternal Grandfather    Colon cancer Paternal Grandmother    Stroke Paternal Grandfather    Seizures Paternal Grandfather    Epilepsy Paternal Grandfather    Cancer Cousin        lymphoma   Esophageal cancer Neg Hx    Rectal cancer Neg Hx    Stomach cancer Neg Hx      Social Connections: Moderately Integrated (12/13/2023)   Social Connection and Isolation Panel    Frequency of Communication with Friends and Family: More than three times a week    Frequency of Social Gatherings with Friends and Family: Patient declined    Attends Religious Services: More than 4 times per year    Active Member of [employer] or Organizations: Yes    Attends Engineer, Structural: More than 4 times per year    Marital Status: Never married     Current Medications[1]   Physical Exam:   BP 128/80 (BP Location: Left Arm, Patient Position: Sitting, Cuff Size: Large)   Pulse 71   Ht 5' 11 (1.803 m)   Wt 236 lb (107 kg)   LMP 07/19/2022   SpO2 98%   BMI 32.92 kg/m   Salient findings:  CN II-XII intact Anterior rhinoscopy: Septum intact; bilateral inferior turbinates without significant hypertrophy No lesions of oral cavity/oropharynx; dentition fair; able to express clear saliva right submandibular duct; on left, cannot feel obvious stone currently but on palpation, can get some mucoid fluid from left submandibular duct No obviously palpable neck masses/lymphadenopathy/thyromegaly EXCEPT left modest edema left SMG gland No respiratory distress or stridor  Seprately Identifiable Procedures:  Prior to initiating any procedures, risks/benefits/alternatives were explained to the patient and verbal consent obtained. None  Impression & Plans:  Gabriella Snyder is a 47 y.o. female with:  1. Tobacco use disorder   2. Submandibular sialolithiasis    Given the  patient's tobacco use, I also discussed cessation and options for cessation, including counseling. Counseled patient on the dangers of tobacco use, advised patient to stop smoking, and reviewed strategies.total time spent with this was 4 minutes.    Given continued edema and tenderness, will extend abx (Augmentin now) for another 10 days; stressed massage and sour candy and warm compress; stone is quite small so expect it may expectorate on its own. If not and has recurrent sx, advised to call  See below regarding exact medications prescribed this encounter including dosages and route: Meds ordered this encounter  Medications   amoxicillin-clavulanate (AUGMENTIN) 875-125 MG tablet    Sig: Take 1 tablet by mouth 2 (two) times daily for 10 days.    Dispense:  20 tablet    Refill:  0      Thank you for allowing me the opportunity to care for your patient. Please do not hesitate to contact me should you have any other questions.  Sincerely, Eldora Blanch, MD Otolaryngologist (ENT), [facility] Phone: [phone] Fax: [phone]  05/06/2024, 9:38 AM   MDM:  Level 4 - 99204 Complexity/Problems addressed: mod Data complexity:  mod - independent interpretation of CT imaging; review of note, labs - Morbidity: mod  - Prescription Drug prescribed or managed: y      [1]  Current Outpatient Medications:    amoxicillin-clavulanate (AUGMENTIN) 875-125 MG tablet, Take 1 tablet by mouth 2 (two) times daily for 10 days., Disp: 20 tablet, Rfl: 0   clindamycin  (CLEOCIN ) 150 MG capsule, Take 1 capsule (150 mg total) by mouth every 6 (six) hours., Disp: 28 capsule, Rfl: 0   ezetimibe  (ZETIA ) 10 MG tablet, Take 1 tablet (10 mg total) by mouth at bedtime., Disp: 90 tablet, Rfl: 3   ibuprofen  (ADVIL ) 400 MG tablet, Take 1 tablet (400 mg total) by mouth every 6 (six) hours as needed for mild pain (pain score 1-3)., Disp: 30 tablet, Rfl: 1   ondansetron  (ZOFRAN -ODT) 4 MG disintegrating  tablet, Take 1 tablet (4 mg total) by mouth every 8 (eight) hours as needed for nausea or vomiting., Disp: 20 tablet, Rfl: 0   oxyCODONE  (ROXICODONE ) 15 MG immediate release tablet, Take 15 mg by mouth 4 (four) times daily as needed for pain., Disp: , Rfl:    oxyCODONE -acetaminophen  (PERCOCET/ROXICET) 5-325 MG tablet, Take 1 tablet by mouth every 6 (six) hours as needed for severe pain (pain score 7-10)., Disp: 15 tablet, Rfl: 0   predniSONE  (DELTASONE ) 20 MG tablet, Take 1 tablet (20 mg total) by mouth daily., Disp: 6 tablet, Rfl: 0   Probiotic Product (RESTORA) CAPS, Take 1 capsule by mouth daily., Disp: 90 capsule, Rfl: 1   rosuvastatin  (CRESTOR ) 40 MG tablet, Take 1 tablet (40 mg total) by mouth daily., Disp: 90 tablet, Rfl: 3   Vitamin D , Ergocalciferol , (DRISDOL ) 1.25 MG (50000 UNIT) CAPS capsule, Take 1 capsule (50,000 Units total) by mouth every 7 (seven) days. (Patient not taking: Reported on 05/06/2024), Disp: 12 capsule, Rfl: 1

## 2024-05-28 ENCOUNTER — Encounter (INDEPENDENT_AMBULATORY_CARE_PROVIDER_SITE_OTHER): Payer: Self-pay | Admitting: Otolaryngology

## 2024-05-28 ENCOUNTER — Ambulatory Visit (INDEPENDENT_AMBULATORY_CARE_PROVIDER_SITE_OTHER): Admitting: Otolaryngology

## 2024-05-28 VITALS — BP 128/82 | HR 74 | Wt 236.0 lb

## 2024-05-28 DIAGNOSIS — K115 Sialolithiasis: Secondary | ICD-10-CM

## 2024-05-28 DIAGNOSIS — F17200 Nicotine dependence, unspecified, uncomplicated: Secondary | ICD-10-CM

## 2024-05-28 MED ORDER — FLUCONAZOLE 150 MG PO TABS: 150.0000 mg | ORAL_TABLET | Freq: Once | ORAL | 0 refills | Status: AC

## 2024-05-28 MED ORDER — AMOXICILLIN-POT CLAVULANATE 875-125 MG PO TABS: 1.0000 | ORAL_TABLET | Freq: Two times a day (BID) | ORAL | 0 refills | Status: AC

## 2024-05-28 NOTE — Patient Instructions (Addendum)
 Take Augmentin  875 mg by mouth (PO) twice daily for 10 days; take with food, take probiotic or yogurt with it We have referred you to [facility] (Dr. Jessee) for possible removal of the salivary gland stone

## 2024-05-28 NOTE — Progress Notes (Signed)
 Dear Dr. Petrina, Here is my assessment for our mutual patient, Gabriella Snyder. Thank you for allowing me the opportunity to care for your patient. Please do not hesitate to contact me should you have any other questions. Sincerely, Dr. Eldora Blanch  Otolaryngology Clinic Note Referring provider: Dr. Petrina HPI:  Gabriella Snyder is a 49 y.o. female kindly referred by Dr. Petrina for evaluation of submandibular gland sialolithiasis  Initial visit (04/2024): Discussed the use of AI scribe software for clinical note transcription with the patient, who gave verbal consent to proceed.  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  Eight days before presentation she had brief rhinorrhea from a presumed viral upper respiratory infection, followed by persistent nausea and one episode of emesis, after which she noted tongue swelling and a big lump over left SMG after eating.  Over subsequent days the swelling became increasingly painful, with pain localized to the jaw and radiating to the inferior ear, causing significant tenderness and difficulty eating, while drinking does not worsen symptoms.  Went to ED, dx with SMG sialolihiasis and Rx abx/steroids; Now nearing completion of a clindamycin  course, with a completed corticosteroid course, and continues sialogogues, massage, and warm compresses every few hours, especially with meals.  She denies fever, weight loss, new cervical masses, prior similar episodes, otolaryngologic surgery, taste changes, or foul tasting drainage and notes the contralateral neck is normal. She does smoke.  Patient otherwise denies: - dysphagia, odynophagia, unintentional weight loss - changes in voice, shortness of breath, hemoptysis - other neck masses  --------------------------------------------------------- 05/28/2024 Returns because she is  having another exacerbation. Left intermittent swelling of gland especially with eating. No fevers.Continues to smoke.   ENT Surgery: no Personal or FHx of bleeding dz or anesthesia difficulty: no  AP/AC: no  Tobacco: yes  PMHx: HLD, T2DM, Beta thalassemia; GAD/MDD  Independent Review of Additional Tests or Records:  ED Notes 04/28/2024 Dr. Towana: noted left neck painful lump worse after eating, only tried sour candy; Dx: Sialadenitis; Rx: Clinda, Pred, ref to ENT Labs CBC and CMP 04/28/2024: BUN/Cr 10/1; WBC 9.6 CT Neck 04/28/2024 independently interpreted: left submandibular gland orifice stone, with left submandibular sialadenitis; b/l level 2 LN, likely reactive in setting of this PMH/Meds/All/SocHx/FamHx/ROS:   Past Medical History:  Diagnosis Date   Anemia    Anxiety    Beta thalassemia (HCC)    Chronic pain    Degenerative disc disease, cervical    Depression    Heart murmur    when I was younger; it closed up   History of blood transfusion 1993   related to the C-section   Migraine    a couple times/yr (02/25/2014)   Muscle spasm    PONV (postoperative nausea and vomiting)    Pre-diabetes    Pyelonephritis 02/25/2014     Past Surgical History:  Procedure Laterality Date   ABLATION     CESAREAN SECTION  05/10/1991   DILATION AND CURETTAGE OF UTERUS  05/09/2001   DILITATION & CURRETTAGE/HYSTROSCOPY WITH NOVASURE ABLATION N/A 07/12/2022   Procedure: DILATATION & CURETTAGE/HYSTEROSCOPY WITH NOVASURE ABLATION;  Surgeon: Corene Coy, MD;  Location: [facility];  Service: Gynecology;  Laterality: N/A;   OPEN REDUCTION INTERNAL FIXATION (ORIF) METACARPAL Right 12/12/2019   Procedure: OPEN REDUCTION INTERNAL FIXATION (ORIF) MIDDLE AND DISTAL PHALANX FRACTURE;  Surgeon: Murrell Drivers, MD;  Location: [facility];  Service: Orthopedics;  Laterality: Right;   PARTIAL HYSTERECTOMY     TUBAL LIGATION  05/09/2009   TUMOR EXCISION  Right ~ 2004   off my back    Family History  Problem Relation Age of Onset   Diabetes Mother    Kidney disease Mother    Prostate cancer Father    Hypertension Father    Cancer Maternal Aunt        cervical   Lung cancer Paternal Aunt    Prostate cancer Paternal Uncle    Prostate cancer Maternal Grandfather    Pancreatic cancer Maternal Grandfather    Colon cancer Paternal Grandmother    Stroke Paternal Grandfather    Seizures Paternal Grandfather    Epilepsy Paternal Grandfather    Cancer Cousin        lymphoma   Esophageal cancer Neg Hx    Rectal cancer Neg Hx    Stomach cancer Neg Hx      Social Connections: Moderately Integrated (12/13/2023)   Social Connection and Isolation Panel    Frequency of Communication with Friends and Family: More than three times a week    Frequency of Social Gatherings with Friends and Family: Patient declined    Attends Religious Services: More than 4 times per year    Active Member of [employer] or Organizations: Yes    Attends Engineer, Structural: More than 4 times per year    Marital Status: Never married     Current Medications[1]   Physical Exam:   BP 128/82 (BP Location: Right Arm, Patient Position: Sitting, Cuff Size: Normal)   Pulse 74   Wt 236 lb (107 kg)   LMP 07/19/2022   SpO2 98%   BMI 32.92 kg/m   Salient findings:  CN II-XII intact Anterior rhinoscopy: Septum intact; bilateral inferior turbinates without significant hypertrophy No lesions of oral cavity/oropharynx; dentition fair; able to express clear saliva right submandibular duct; on left, cannot feel obvious stone currently but on palpation, just clear saliva from duct No obviously palpable neck masses/lymphadenopathy/thyromegaly EXCEPT mild fullness left SMG gland No respiratory distress or stridor  Seprately Identifiable Procedures:  Prior to initiating any procedures, risks/benefits/alternatives were explained to the patient and verbal consent  obtained. None  Impression & Plans:  Gabriella Snyder is a 49 y.o. female with:  1. Submandibular sialolithiasis   2. Tobacco use disorder    Continues to have issues with small stone near orifice. We discussed that given persistent exacerbation, makes most sense to consider stone retrieval, possible sialendoscopy. Do not have equipment to do that here so will refer to [facility]  Will give a round of augmentin  and continue sialologues and massage in meantime; will see her back as needed  See below regarding exact medications prescribed this encounter including dosages and route: Meds ordered this encounter  Medications   amoxicillin -clavulanate (AUGMENTIN ) 875-125 MG tablet    Sig: Take 1 tablet by mouth 2 (two) times daily for 10 days.    Dispense:  20 tablet    Refill:  0   fluconazole  (DIFLUCAN ) 150 MG tablet    Sig: Take 1 tablet (150 mg total) by mouth once for 1 dose.    Dispense:  1 tablet    Refill:  0      Thank you for allowing me the opportunity to care for your patient. Please do not hesitate to contact me should you have any other questions.  Sincerely, Eldora Blanch, MD Otolaryngologist (ENT), [facility] Phone:  [phone] Fax: [phone]  05/28/2024, 3:06 PM   MDM:  [id number] Complexity/Problems addressed: mod - chronic problem now with exacerbation Data complexity: low - Morbidity: mod  - Prescription Drug prescribed or managed: y      [1]  Current Outpatient Medications:    amoxicillin -clavulanate (AUGMENTIN ) 875-125 MG tablet, Take 1 tablet by mouth 2 (two) times daily for 10 days., Disp: 20 tablet, Rfl: 0   clindamycin  (CLEOCIN ) 150 MG capsule, Take 1 capsule (150 mg total) by mouth every 6 (six) hours., Disp: 28 capsule, Rfl: 0   ezetimibe  (ZETIA ) 10 MG tablet, Take 1 tablet (10 mg total) by mouth at bedtime., Disp: 90 tablet, Rfl: 3   fluconazole  (DIFLUCAN ) 150 MG tablet, Take 1 tablet (150 mg total) by mouth once for 1 dose., Disp: 1  tablet, Rfl: 0   ibuprofen  (ADVIL ) 400 MG tablet, Take 1 tablet (400 mg total) by mouth every 6 (six) hours as needed for mild pain (pain score 1-3)., Disp: 30 tablet, Rfl: 1   ondansetron  (ZOFRAN -ODT) 4 MG disintegrating tablet, Take 1 tablet (4 mg total) by mouth every 8 (eight) hours as needed for nausea or vomiting., Disp: 20 tablet, Rfl: 0   oxyCODONE  (ROXICODONE ) 15 MG immediate release tablet, Take 15 mg by mouth 4 (four) times daily as needed for pain., Disp: , Rfl:    oxyCODONE -acetaminophen  (PERCOCET/ROXICET) 5-325 MG tablet, Take 1 tablet by mouth every 6 (six) hours as needed for severe pain (pain score 7-10)., Disp: 15 tablet, Rfl: 0   predniSONE  (DELTASONE ) 20 MG tablet, Take 1 tablet (20 mg total) by mouth daily., Disp: 6 tablet, Rfl: 0   Probiotic Product (RESTORA) CAPS, Take 1 capsule by mouth daily., Disp: 90 capsule, Rfl: 1   rosuvastatin  (CRESTOR ) 40 MG tablet, Take 1 tablet (40 mg total) by mouth daily., Disp: 90 tablet, Rfl: 3   Vitamin D , Ergocalciferol , (DRISDOL ) 1.25 MG (50000 UNIT) CAPS capsule, Take 1 capsule (50,000 Units total) by mouth every 7 (seven) days. (Patient not taking: Reported on 05/28/2024), Disp: 12 capsule, Rfl: 1

## 2024-07-18 ENCOUNTER — Encounter: Admitting: Family Medicine
